# Patient Record
Sex: Female | Born: 1945 | Race: White | Hispanic: No | Marital: Married | Smoking: Never smoker
Health system: Southern US, Community
[De-identification: ages and names within clinical notes are randomized; demographics above are authoritative.]

## PROBLEM LIST (undated history)

## (undated) DIAGNOSIS — T8859XA Other complications of anesthesia, initial encounter: Secondary | ICD-10-CM

## (undated) DIAGNOSIS — Z9289 Personal history of other medical treatment: Secondary | ICD-10-CM

## (undated) DIAGNOSIS — R350 Frequency of micturition: Secondary | ICD-10-CM

## (undated) DIAGNOSIS — M545 Low back pain, unspecified: Secondary | ICD-10-CM

## (undated) DIAGNOSIS — K5792 Diverticulitis of intestine, part unspecified, without perforation or abscess without bleeding: Secondary | ICD-10-CM

## (undated) DIAGNOSIS — M2550 Pain in unspecified joint: Secondary | ICD-10-CM

## (undated) DIAGNOSIS — T8130XA Disruption of wound, unspecified, initial encounter: Secondary | ICD-10-CM

## (undated) DIAGNOSIS — R072 Precordial pain: Secondary | ICD-10-CM

## (undated) DIAGNOSIS — R531 Weakness: Secondary | ICD-10-CM

## (undated) DIAGNOSIS — T4145XA Adverse effect of unspecified anesthetic, initial encounter: Secondary | ICD-10-CM

## (undated) DIAGNOSIS — M1711 Unilateral primary osteoarthritis, right knee: Secondary | ICD-10-CM

## (undated) DIAGNOSIS — Z9889 Other specified postprocedural states: Secondary | ICD-10-CM

## (undated) DIAGNOSIS — G2581 Restless legs syndrome: Secondary | ICD-10-CM

## (undated) DIAGNOSIS — W19XXXA Unspecified fall, initial encounter: Secondary | ICD-10-CM

## (undated) DIAGNOSIS — S7222XA Displaced subtrochanteric fracture of left femur, initial encounter for closed fracture: Secondary | ICD-10-CM

## (undated) DIAGNOSIS — M1712 Unilateral primary osteoarthritis, left knee: Secondary | ICD-10-CM

## (undated) DIAGNOSIS — S72042A Displaced fracture of base of neck of left femur, initial encounter for closed fracture: Secondary | ICD-10-CM

## (undated) DIAGNOSIS — M810 Age-related osteoporosis without current pathological fracture: Secondary | ICD-10-CM

## (undated) DIAGNOSIS — C50919 Malignant neoplasm of unspecified site of unspecified female breast: Secondary | ICD-10-CM

## (undated) DIAGNOSIS — G8929 Other chronic pain: Secondary | ICD-10-CM

## (undated) DIAGNOSIS — E119 Type 2 diabetes mellitus without complications: Secondary | ICD-10-CM

## (undated) DIAGNOSIS — M549 Dorsalgia, unspecified: Secondary | ICD-10-CM

## (undated) DIAGNOSIS — M79604 Pain in right leg: Secondary | ICD-10-CM

## (undated) DIAGNOSIS — M1990 Unspecified osteoarthritis, unspecified site: Secondary | ICD-10-CM

## (undated) DIAGNOSIS — M1710 Unilateral primary osteoarthritis, unspecified knee: Secondary | ICD-10-CM

## (undated) DIAGNOSIS — I10 Essential (primary) hypertension: Secondary | ICD-10-CM

## (undated) DIAGNOSIS — K5900 Constipation, unspecified: Secondary | ICD-10-CM

## (undated) DIAGNOSIS — M109 Gout, unspecified: Secondary | ICD-10-CM

## (undated) DIAGNOSIS — R351 Nocturia: Secondary | ICD-10-CM

## (undated) HISTORY — PX: ESOPHAGOGASTRODUODENOSCOPY: SHX1529

## (undated) HISTORY — PX: EYE SURGERY: SHX253

## (undated) HISTORY — DX: Other specified postprocedural states: Z98.890

## (undated) HISTORY — DX: Displaced subtrochanteric fracture of left femur, initial encounter for closed fracture: S72.22XA

## (undated) HISTORY — PX: VARICOSE VEIN SURGERY: SHX832

## (undated) HISTORY — DX: Age-related osteoporosis without current pathological fracture: M81.0

## (undated) HISTORY — PX: BREAST LUMPECTOMY: SHX2

## (undated) HISTORY — DX: Unilateral primary osteoarthritis, unspecified knee: M17.10

## (undated) HISTORY — PX: BACK SURGERY: SHX140

## (undated) HISTORY — PX: CARDIAC CATHETERIZATION: SHX172

## (undated) HISTORY — DX: Unspecified fall, initial encounter: W19.XXXA

## (undated) HISTORY — PX: BREAST BIOPSY: SHX20

## (undated) HISTORY — DX: Displaced fracture of base of neck of left femur, initial encounter for closed fracture: S72.042A

## (undated) HISTORY — DX: Malignant neoplasm of unspecified site of unspecified female breast: C50.919

## (undated) HISTORY — DX: Precordial pain: R07.2

---

## 1976-04-04 HISTORY — PX: CHOLECYSTECTOMY: SHX55

## 1978-04-04 HISTORY — PX: TUBAL LIGATION: SHX77

## 1979-04-05 HISTORY — PX: OTHER SURGICAL HISTORY: SHX169

## 1996-04-04 HISTORY — PX: PARTIAL HYSTERECTOMY: SHX80

## 2009-08-09 ENCOUNTER — Emergency Department (HOSPITAL_COMMUNITY): Admission: EM | Admit: 2009-08-09 | Discharge: 2009-08-10 | Payer: Self-pay | Admitting: Emergency Medicine

## 2009-10-04 ENCOUNTER — Observation Stay (HOSPITAL_COMMUNITY): Admission: EM | Admit: 2009-10-04 | Discharge: 2009-10-05 | Payer: Self-pay | Admitting: Emergency Medicine

## 2009-10-04 IMAGING — CR DG CHEST 1V PORT
1 series · 1 of 1 positions shown · non-contrast
Comparison: None.

CLINICAL DATA: Pain at anterior neck.  Chest pain.  History
hypertension and diabetes.  Nonsmoker.

PORTABLE CHEST - 1 VIEW

[AP]
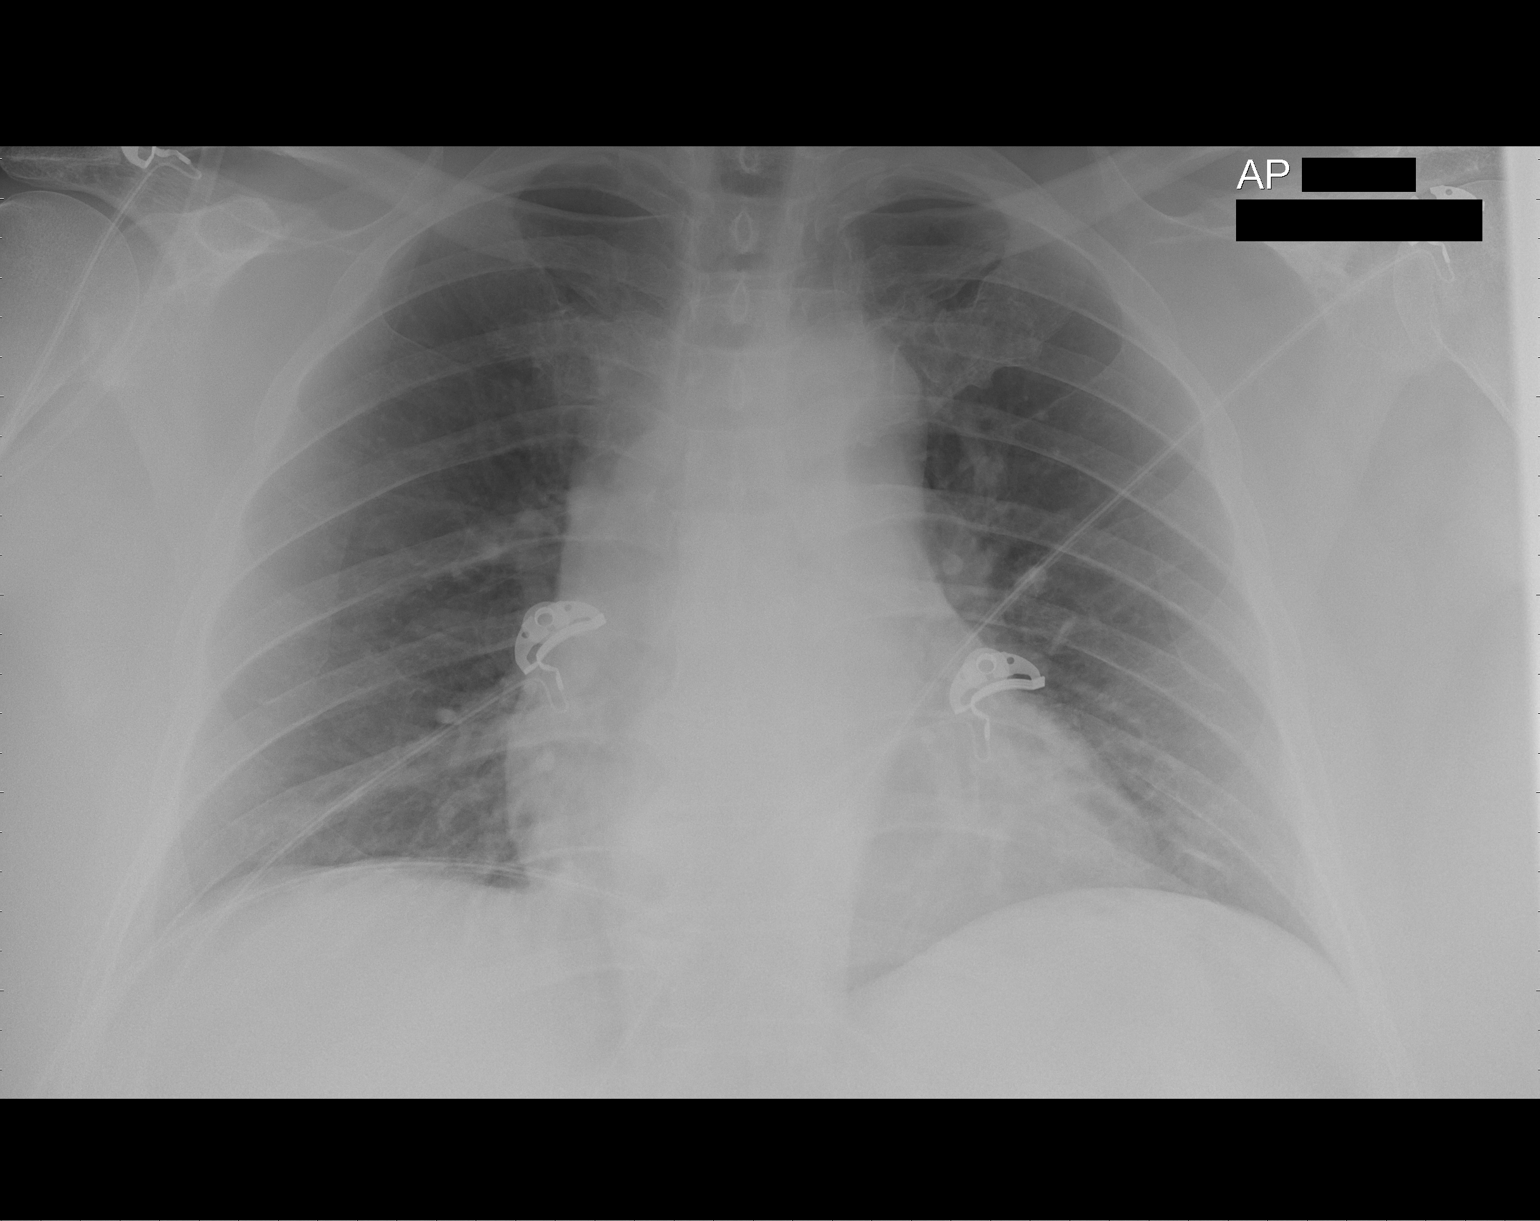

[1 of 1 positions shown; findings below may reference images not displayed]

FINDINGS: Midline trachea.  Heart size upper limits of normal
accentuated by AP portable technique. No pleural effusion or
pneumothorax.  Lung volumes are mildly low.  Minimal subsegmental
atelectasis at the lung bases.
IMPRESSION: Low lung volumes without acute disease.

## 2010-06-20 LAB — CK TOTAL AND CKMB (NOT AT ARMC): Total CK: 111 U/L (ref 7–177)

## 2010-06-20 LAB — BASIC METABOLIC PANEL
BUN: 16 mg/dL (ref 6–23)
CO2: 27 mEq/L (ref 19–32)
Calcium: 8.7 mg/dL (ref 8.4–10.5)
Chloride: 99 mEq/L (ref 96–112)
Creatinine, Ser: 0.98 mg/dL (ref 0.4–1.2)
GFR calc Af Amer: >60 mL/min (ref >60)
Potassium: 3.6 mEq/L (ref 3.5–5.1)

## 2010-06-20 LAB — GLUCOSE, CAPILLARY
Glucose-Capillary: 115 mg/dL — ABNORMAL HIGH (ref 70–99)
Glucose-Capillary: 125 mg/dL — ABNORMAL HIGH (ref 70–99)
Glucose-Capillary: 134 mg/dL — ABNORMAL HIGH (ref 70–99)
Glucose-Capillary: 153 mg/dL — ABNORMAL HIGH (ref 70–99)

## 2010-06-20 LAB — CARDIAC PANEL(CRET KIN+CKTOT+MB+TROPI)
CK, MB: 1.8 ng/mL (ref 0.3–4.0)
Relative Index: INVALID (ref 0.0–2.5)
Total CK: 114 U/L (ref 7–177)
Total CK: 92 U/L (ref 7–177)
Troponin I: 0.02 ng/mL (ref 0.00–0.06)

## 2010-06-20 LAB — URINALYSIS, ROUTINE W REFLEX MICROSCOPIC
Nitrite: NEGATIVE
Specific Gravity, Urine: 1.025 (ref 1.005–1.030)
Urobilinogen, UA: 1 mg/dL (ref 0.0–1.0)
pH: 6 (ref 5.0–8.0)

## 2010-06-20 LAB — LIPID PANEL
Cholesterol: 122 mg/dL (ref 0–200)
HDL: 47 mg/dL (ref >39)
LDL Cholesterol: 64 mg/dL (ref 0–99)
Total CHOL/HDL Ratio: 2.6 RATIO
VLDL: 11 mg/dL (ref 0–40)

## 2010-06-20 LAB — CBC
Hemoglobin: 12.7 g/dL (ref 12.0–15.0)
MCHC: 33.8 g/dL (ref 30.0–36.0)
MCV: 97.6 fL (ref 78.0–100.0)
Platelets: 229 10*3/uL (ref 150–400)
RBC: 3.83 MIL/uL — ABNORMAL LOW (ref 3.87–5.11)
RDW: 13.9 % (ref 11.5–15.5)
WBC: 9.2 10*3/uL (ref 4.0–10.5)

## 2010-06-20 LAB — POCT CARDIAC MARKERS
CKMB, poc: <1 ng/mL — ABNORMAL LOW (ref 1.0–8.0)
Myoglobin, poc: 54.1 ng/mL (ref 12–200)
Troponin i, poc: <0.05 ng/mL (ref 0.00–0.09)

## 2010-06-22 LAB — URINALYSIS, ROUTINE W REFLEX MICROSCOPIC
Hgb urine dipstick: NEGATIVE
Nitrite: NEGATIVE
Protein, ur: NEGATIVE mg/dL
Urobilinogen, UA: 1 mg/dL (ref 0.0–1.0)
pH: 6 (ref 5.0–8.0)

## 2010-06-22 LAB — DIFFERENTIAL
Basophils Relative: 0 % (ref 0–1)
Eosinophils Absolute: 0.1 10*3/uL (ref 0.0–0.7)
Eosinophils Relative: 1 % (ref 0–5)
Lymphocytes Relative: 27 % (ref 12–46)
Monocytes Absolute: 0.9 10*3/uL (ref 0.1–1.0)
Neutro Abs: 7.9 10*3/uL — ABNORMAL HIGH (ref 1.7–7.7)
Neutrophils Relative %: 65 % (ref 43–77)

## 2010-06-22 LAB — CBC
HCT: 39 % (ref 36.0–46.0)
WBC: 12.1 10*3/uL — ABNORMAL HIGH (ref 4.0–10.5)

## 2010-06-22 LAB — COMPREHENSIVE METABOLIC PANEL
ALT: 17 U/L (ref 0–35)
AST: 20 U/L (ref 0–37)
Alkaline Phosphatase: 69 U/L (ref 39–117)
Calcium: 8.3 mg/dL — ABNORMAL LOW (ref 8.4–10.5)
Creatinine, Ser: 0.91 mg/dL (ref 0.4–1.2)
GFR calc Af Amer: >60 mL/min (ref >60)
Glucose, Bld: 168 mg/dL — ABNORMAL HIGH (ref 70–99)
Sodium: 131 mEq/L — ABNORMAL LOW (ref 135–145)

## 2010-06-22 LAB — LIPASE, BLOOD: Lipase: 19 U/L (ref 11–59)

## 2013-09-12 ENCOUNTER — Encounter (HOSPITAL_COMMUNITY): Payer: Self-pay | Admitting: Emergency Medicine

## 2013-09-12 ENCOUNTER — Emergency Department (HOSPITAL_COMMUNITY)
Admission: EM | Admit: 2013-09-12 | Discharge: 2013-09-12 | Disposition: A | Discharge status: Home / Self Care | Payer: Medicare Other | Attending: Emergency Medicine | Admitting: Emergency Medicine

## 2013-09-12 DIAGNOSIS — I10 Essential (primary) hypertension: Secondary | ICD-10-CM | POA: Insufficient documentation

## 2013-09-12 DIAGNOSIS — Z79899 Other long term (current) drug therapy: Secondary | ICD-10-CM | POA: Insufficient documentation

## 2013-09-12 DIAGNOSIS — Z7982 Long term (current) use of aspirin: Secondary | ICD-10-CM | POA: Insufficient documentation

## 2013-09-12 DIAGNOSIS — Z880 Allergy status to penicillin: Secondary | ICD-10-CM | POA: Insufficient documentation

## 2013-09-12 DIAGNOSIS — E119 Type 2 diabetes mellitus without complications: Secondary | ICD-10-CM | POA: Insufficient documentation

## 2013-09-12 DIAGNOSIS — Z791 Long term (current) use of non-steroidal anti-inflammatories (NSAID): Secondary | ICD-10-CM | POA: Insufficient documentation

## 2013-09-12 DIAGNOSIS — E860 Dehydration: Secondary | ICD-10-CM | POA: Insufficient documentation

## 2013-09-12 DIAGNOSIS — M129 Arthropathy, unspecified: Secondary | ICD-10-CM | POA: Insufficient documentation

## 2013-09-12 DIAGNOSIS — R197 Diarrhea, unspecified: Secondary | ICD-10-CM | POA: Insufficient documentation

## 2013-09-12 DIAGNOSIS — Z8719 Personal history of other diseases of the digestive system: Secondary | ICD-10-CM | POA: Insufficient documentation

## 2013-09-12 DIAGNOSIS — Z8669 Personal history of other diseases of the nervous system and sense organs: Secondary | ICD-10-CM | POA: Insufficient documentation

## 2013-09-12 DIAGNOSIS — R112 Nausea with vomiting, unspecified: Secondary | ICD-10-CM

## 2013-09-12 HISTORY — DX: Diverticulitis of intestine, part unspecified, without perforation or abscess without bleeding: K57.92

## 2013-09-12 HISTORY — DX: Essential (primary) hypertension: I10

## 2013-09-12 HISTORY — DX: Unspecified osteoarthritis, unspecified site: M19.90

## 2013-09-12 HISTORY — DX: Type 2 diabetes mellitus without complications: E11.9

## 2013-09-12 HISTORY — DX: Restless legs syndrome: G25.81

## 2013-09-12 LAB — CBC WITH DIFFERENTIAL/PLATELET
Basophils Absolute: 0 10*3/uL (ref 0.0–0.1)
Basophils Relative: 0 % (ref 0–1)
EOS PCT: 1 % (ref 0–5)
Eosinophils Absolute: 0.1 10*3/uL (ref 0.0–0.7)
HEMATOCRIT: 38.6 % (ref 36.0–46.0)
HEMOGLOBIN: 12.9 g/dL (ref 12.0–15.0)
LYMPHS PCT: 28 % (ref 12–46)
Lymphs Abs: 3.2 10*3/uL (ref 0.7–4.0)
MCH: 31.9 pg (ref 26.0–34.0)
MCHC: 33.4 g/dL (ref 30.0–36.0)
MCV: 95.5 fL (ref 78.0–100.0)
MONO ABS: 0.7 10*3/uL (ref 0.1–1.0)
MONOS PCT: 6 % (ref 3–12)
Neutro Abs: 7.4 10*3/uL (ref 1.7–7.7)
Neutrophils Relative %: 65 % (ref 43–77)
Platelets: 284 10*3/uL (ref 150–400)
RBC: 4.04 MIL/uL (ref 3.87–5.11)
RDW: 14.4 % (ref 11.5–15.5)
WBC: 11.4 10*3/uL — AB (ref 4.0–10.5)

## 2013-09-12 LAB — COMPREHENSIVE METABOLIC PANEL
ALT: 25 U/L (ref 0–35)
AST: 20 U/L (ref 0–37)
Albumin: 3.3 g/dL — ABNORMAL LOW (ref 3.5–5.2)
Alkaline Phosphatase: 73 U/L (ref 39–117)
BILIRUBIN TOTAL: 0.5 mg/dL (ref 0.3–1.2)
BUN: 20 mg/dL (ref 6–23)
CALCIUM: 9.7 mg/dL (ref 8.4–10.5)
CO2: 24 mEq/L (ref 19–32)
Chloride: 95 mEq/L — ABNORMAL LOW (ref 96–112)
Creatinine, Ser: 0.78 mg/dL (ref 0.50–1.10)
GFR calc Af Amer: >90 mL/min (ref >90)
GFR, EST NON AFRICAN AMERICAN: 85 mL/min — AB (ref >90)
Glucose, Bld: 157 mg/dL — ABNORMAL HIGH (ref 70–99)
Potassium: 4.6 mEq/L (ref 3.7–5.3)
Sodium: 135 mEq/L — ABNORMAL LOW (ref 137–147)
Total Protein: 7.8 g/dL (ref 6.0–8.3)

## 2013-09-12 LAB — I-STAT TROPONIN, ED: Troponin i, poc: 0 ng/mL (ref 0.00–0.08)

## 2013-09-12 LAB — LIPASE, BLOOD: Lipase: 19 U/L (ref 11–59)

## 2013-09-12 MED ORDER — SODIUM CHLORIDE 0.9 % IV SOLN
1000.0000 mL | Freq: Once | INTRAVENOUS | Status: AC
  Administered 2013-09-12: 1000 mL via INTRAVENOUS

## 2013-09-12 MED ORDER — SODIUM CHLORIDE 0.9 % IV SOLN: 1000.0000 mL | Freq: Once | INTRAVENOUS | Status: DC

## 2013-09-12 MED ORDER — SODIUM CHLORIDE 0.9 % IV SOLN: 1000.0000 mL | INTRAVENOUS | Status: DC

## 2013-09-12 MED ORDER — ONDANSETRON 8 MG PO TBDP: 8.0000 mg | ORAL_TABLET | Freq: Three times a day (TID) | ORAL | Status: DC | PRN

## 2013-09-12 MED ORDER — ONDANSETRON HCL 4 MG/2ML IJ SOLN
4.0000 mg | Freq: Once | INTRAMUSCULAR | Status: AC
  Administered 2013-09-12: 4 mg via INTRAVENOUS
  Filled 2013-09-12: qty 2

## 2013-09-12 NOTE — ED Provider Notes (Signed)
CSN: [id number]     Arrival date & time 09/12/13  1203 History   First MD Initiated Contact with Patient 09/12/13 1311     Chief Complaint  Patient presents with  . Nausea    The patient has been sick to her stomach since saturday.  The patient said, 'I can't keep anything down."  . Emesis  . Diarrhea      The history is provided by the patient.   Patient reports nausea vomiting and diarrhea over the past several days.  She feels weak today.  She's been unable to keep fluids down.  She reports she is vomiting yellowish material.  No hematemesis.  No melena or hematochezia.  She does report some upper abdominal discomfort.  She is status post cholecystectomy in 1978.  No fevers or chills.  No chest pain or shortness of breath.  No recent sick contact   Past Medical History  Diagnosis Date  . Hypertension   . Diabetes mellitus without complication   . Diverticulitis   . Restless leg syndrome   . Arthritis    Past Surgical History  Procedure Laterality Date  . Tubal ligation    . Abdominal hysterectomy      Partial  . Bladder tacking    . Cholecystectomy     History reviewed. No pertinent family history. History  Substance Use Topics  . Smoking status: Never Smoker   . Smokeless tobacco: Never Used  . Alcohol Use: No   OB History   Grav Para Term Preterm Abortions TAB SAB Ect Mult Living                 Review of Systems  All other systems reviewed and are negative.     Allergies  Penicillins  Home Medications   Prior to Admission medications   Medication Sig Start Date End Date Taking? Authorizing Provider  acetaminophen (TYLENOL) 325 MG tablet Take 650 mg by mouth every 6 (six) hours as needed for mild pain, fever or headache.   Yes Historical Provider, MD  allopurinol (ZYLOPRIM) 100 MG tablet Take 100 mg by mouth 2 (two) times daily.   Yes Historical Provider, MD  aspirin EC 81 MG tablet Take 81 mg by mouth daily.   Yes Historical Provider, MD   Cholecalciferol (VITAMIN D) 2000 UNITS CAPS Take 2,000 Units by mouth daily.   Yes Historical Provider, MD  glimepiride (AMARYL) 1 MG tablet Take 1 mg by mouth daily with breakfast.   Yes Historical Provider, MD  ibuprofen (ADVIL,MOTRIN) 200 MG tablet Take 800 mg by mouth every 6 (six) hours as needed for fever, headache or mild pain.   Yes Historical Provider, MD  lisinopril-hydrochlorothiazide (PRINZIDE,ZESTORETIC) 20-25 MG per tablet Take 1 tablet by mouth daily.   Yes Historical Provider, MD  meloxicam (MOBIC) 15 MG tablet Take 15 mg by mouth daily.   Yes Historical Provider, MD  metFORMIN (GLUCOPHAGE) 500 MG tablet Take 500 mg by mouth 2 (two) times daily with a meal.   Yes Historical Provider, MD  pregabalin (LYRICA) 150 MG capsule Take 150 mg by mouth 3 (three) times daily.   Yes Historical Provider, MD  rOPINIRole (REQUIP) 2 MG tablet Take 2 mg by mouth at bedtime.   Yes Historical Provider, MD  terbinafine (LAMISIL) 250 MG tablet Take 250 mg by mouth daily.   Yes Historical Provider, MD   BP 126/75  Pulse 100  Temp(Src) 97.7 F (36.5 C) (Oral)  Resp 18  SpO2 100% Physical  Exam  Nursing note and vitals reviewed. Constitutional: She is oriented to person, place, and time. She appears well-developed and well-nourished. No distress.  HENT:  Head: Normocephalic and atraumatic.  Dry mucous membranes  Eyes: EOM are normal.  Neck: Normal range of motion.  Cardiovascular: Normal rate, regular rhythm and normal heart sounds.   Pulmonary/Chest: Effort normal and breath sounds normal.  Abdominal: Soft. She exhibits no distension. There is no tenderness.  Musculoskeletal: Normal range of motion.  Neurological: She is alert and oriented to person, place, and time.  Skin: Skin is warm and dry.  Psychiatric: She has a normal mood and affect. Judgment normal.    ED Course  Procedures (including critical care time) Labs Review Labs Reviewed  CBC WITH DIFFERENTIAL - Abnormal; Notable for  the following:    WBC 11.4 (*)    All other components within normal limits  COMPREHENSIVE METABOLIC PANEL - Abnormal; Notable for the following:    Sodium 135 (*)    Chloride 95 (*)    Glucose, Bld 157 (*)    Albumin 3.3 (*)    GFR calc non Af Amer 85 (*)    All other components within normal limits  LIPASE, BLOOD  I-STAT TROPOININ, ED    Imaging Review No results found.   EKG Interpretation   Date/Time:  Thursday September 12 2013 12:31:49 EDT Ventricular Rate:  98 PR Interval:    QRS Duration: 86 QT Interval:  320 QTC Calculation: 408 R Axis:   -80 Text Interpretation:  Normal sinus rhythm Left axis deviation Pulmonary  disease pattern Abnormal ECG Confirmed by Rashauna Tep  MD, Caryn Bee ([id number]) on  09/12/2013 1:31:10 PM      MDM   Final diagnoses:  None    3:25 PM Patient feels much better after IV fluids and Zofran.  Patient is keeping oral fluids and food down at this time.  She feels better would like to go home.  Discharge home with Zofran.  She understands return to the ER for new or worsening symptoms.  I suspect the majority of her upper abdominal discomfort is secondary to vomiting and muscular pain    Lyanne Co, MD 09/12/13 1525

## 2013-09-12 NOTE — ED Notes (Addendum)
The patient has been sick to her stomach since saturday.  The patient said, 'I can't keep anything down."  The patient said she took her Lyrica and Ropinirole for restless leg syndrome and she doesn't know if taking them together is what's making her sick.  The patient said she took it again together on Monday and she "passed out".  She also said she cannot eat anything because her stomach is sick.   The patient has been feeling weak and nauseous since Saturday.  She is here today to be evaluated.

## 2014-04-09 ENCOUNTER — Encounter (HOSPITAL_COMMUNITY): Payer: Self-pay | Admitting: Physician Assistant

## 2014-04-09 DIAGNOSIS — M1711 Unilateral primary osteoarthritis, right knee: Secondary | ICD-10-CM | POA: Diagnosis present

## 2014-04-09 DIAGNOSIS — M1990 Unspecified osteoarthritis, unspecified site: Secondary | ICD-10-CM | POA: Diagnosis present

## 2014-04-09 DIAGNOSIS — K5792 Diverticulitis of intestine, part unspecified, without perforation or abscess without bleeding: Secondary | ICD-10-CM | POA: Diagnosis present

## 2014-04-09 DIAGNOSIS — E119 Type 2 diabetes mellitus without complications: Secondary | ICD-10-CM

## 2014-04-09 DIAGNOSIS — I10 Essential (primary) hypertension: Secondary | ICD-10-CM | POA: Diagnosis present

## 2014-04-09 NOTE — H&P (Signed)
TOTAL KNEE ADMISSION H&P  Patient is being admitted for right total knee arthroplasty.  Subjective:  Chief Complaint:right knee pain.  HPI: Amber Ashley, 69 y.o. female, has a history of pain and functional disability in the right knee due to arthritis and has failed non-surgical conservative treatments for greater than 12 weeks to includeNSAID's and/or analgesics, corticosteriod injections, viscosupplementation injections, flexibility and strengthening excercises, supervised PT with diminished ADL's post treatment, use of assistive devices and activity modification.  Onset of symptoms was gradual, starting 10 years ago with gradually worsening course since that time. The patient noted no past surgery on the right knee(s).  Patient currently rates pain in the right knee(s) at 10 out of 10 with activity. Patient has night pain, worsening of pain with activity and weight bearing, pain that interferes with activities of daily living, crepitus and joint swelling.  Patient has evidence of subchondral sclerosis, periarticular osteophytes and joint space narrowing by imaging studies.  There is no active infection.  Patient Active Problem List   Diagnosis Date Noted  . Primary localized osteoarthritis of right knee   . Hypertension   . Diabetes mellitus without complication   . Diverticulitis   . Arthritis    Past Medical History  Diagnosis Date  . Hypertension   . Diabetes mellitus without complication   . Diverticulitis   . Restless leg syndrome   . Arthritis   . Primary localized osteoarthritis of right knee     Past Surgical History  Procedure Laterality Date  . Tubal ligation    . Abdominal hysterectomy      Partial  . Bladder tacking    . Cholecystectomy      No prescriptions prior to admission   Allergies  Allergen Reactions  . Penicillins     History  Substance Use Topics  . Smoking status: Never Smoker   . Smokeless tobacco: Never Used  . Alcohol Use: No    Family  History  Problem Relation Age of Onset  . Heart attack Mother   . Stroke Mother   . Heart disease Father   . Heart attack Father   . Hypertension Brother   . Diabetes Brother      Review of Systems  Constitutional: Negative.   HENT: Negative.   Eyes: Negative.   Respiratory: Negative.   Cardiovascular: Negative.   Gastrointestinal: Negative.   Genitourinary: Negative.   Musculoskeletal: Positive for joint pain.       Neuropathy and knee pain  Skin: Negative.   Neurological: Negative.   Endo/Heme/Allergies: Negative.   Psychiatric/Behavioral: Negative.     Objective:  Physical Exam  Constitutional: She appears well-developed and well-nourished.  HENT:  Head: Normocephalic and atraumatic.  Mouth/Throat: Oropharynx is clear and moist.  Eyes: Conjunctivae and EOM are normal. Pupils are equal, round, and reactive to light.  Neck: Neck supple.  Cardiovascular: Normal rate and regular rhythm.   Respiratory: Effort normal and breath sounds normal.  GI: Soft. Bowel sounds are normal.  Genitourinary:  Not pertinent to current symptomatology therefore not examined.  Musculoskeletal:  Examination of both knee reveal pain medially and laterally, 1+ crepitation 1+ synovitis, range of motion -5 to 125 degrees both knees are stable with normal patella tracking. Vascular exam: pulses 2+ and symmetric.  Skin: Skin is warm and dry.  Psychiatric: She has a normal mood and affect.    Vital signs in last 24 hours: Temp:  [97.9 F (36.6 C)] 97.9 F (36.6 C) (01/06 1300) Pulse Rate:  [  77] 77 (01/06 1300) BP: (119)/(71) 119/71 mmHg (01/06 1300) SpO2:  [99 %] 99 % (01/06 1300) Weight:  [106.595 kg (235 lb)] 106.595 kg (235 lb) (01/06 1300)  Labs:   Estimated body mass index is 42.97 kg/(m^2) as calculated from the following:   Height as of this encounter:  (1.575 m).   Weight as of this encounter: 106.595 kg (235 lb).   Imaging Review Plain radiographs demonstrate severe  degenerative joint disease of the right knee(s). The overall alignment ismild valgus. The bone quality appears to be good for age and reported activity level.  Assessment/Plan:  End stage arthritis, right knee   The patient history, physical examination, clinical judgment of the provider and imaging studies are consistent with end stage degenerative joint disease of the right knee(s) and total knee arthroplasty is deemed medically necessary. The treatment options including medical management, injection therapy arthroscopy and arthroplasty were discussed at length. The risks and benefits of total knee arthroplasty were presented and reviewed. The risks due to aseptic loosening, infection, stiffness, patella tracking problems, thromboembolic complications and other imponderables were discussed. The patient acknowledged the explanation, agreed to proceed with the plan and consent was signed. Patient is being admitted for inpatient treatment for surgery, pain control, PT, OT, prophylactic antibiotics, VTE prophylaxis, progressive ambulation and ADL's and discharge planning. The patient is planning to be discharged home with home health services   Rozanna Cormany A. Gwinda Passe Physician Assistant [facility] [phone]  04/09/2014, 2:45 PM

## 2014-04-10 ENCOUNTER — Other Ambulatory Visit: Payer: Self-pay | Admitting: Orthopedic Surgery

## 2014-04-10 DIAGNOSIS — M51369 Other intervertebral disc degeneration, lumbar region without mention of lumbar back pain or lower extremity pain: Secondary | ICD-10-CM

## 2014-04-10 DIAGNOSIS — M5416 Radiculopathy, lumbar region: Secondary | ICD-10-CM

## 2014-04-10 DIAGNOSIS — M5136 Other intervertebral disc degeneration, lumbar region: Secondary | ICD-10-CM

## 2014-04-11 ENCOUNTER — Ambulatory Visit
Admission: RE | Admit: 2014-04-11 | Discharge: 2014-04-11 | Disposition: A | Discharge status: Home / Self Care | Payer: Medicare Other | Source: Ambulatory Visit | Attending: Orthopedic Surgery | Admitting: Orthopedic Surgery

## 2014-04-11 DIAGNOSIS — M5416 Radiculopathy, lumbar region: Secondary | ICD-10-CM

## 2014-04-11 DIAGNOSIS — M5136 Other intervertebral disc degeneration, lumbar region: Secondary | ICD-10-CM

## 2014-04-14 ENCOUNTER — Other Ambulatory Visit: Payer: Self-pay | Admitting: Orthopedic Surgery

## 2014-04-14 DIAGNOSIS — M79604 Pain in right leg: Secondary | ICD-10-CM

## 2014-04-16 ENCOUNTER — Inpatient Hospital Stay (HOSPITAL_COMMUNITY): Admission: RE | Admit: 2014-04-16 | Payer: Medicare Other | Source: Ambulatory Visit

## 2014-04-17 ENCOUNTER — Ambulatory Visit
Admission: RE | Admit: 2014-04-17 | Discharge: 2014-04-17 | Disposition: A | Discharge status: Home / Self Care | Payer: Medicare Other | Source: Ambulatory Visit | Attending: Orthopedic Surgery | Admitting: Orthopedic Surgery

## 2014-04-17 DIAGNOSIS — M79604 Pain in right leg: Secondary | ICD-10-CM

## 2014-04-17 DIAGNOSIS — M1990 Unspecified osteoarthritis, unspecified site: Secondary | ICD-10-CM

## 2014-04-17 MED ORDER — IOHEXOL 180 MG/ML  SOLN
1.0000 mL | Freq: Once | INTRAMUSCULAR | Status: AC | PRN
  Administered 2014-04-17: 1 mL via EPIDURAL

## 2014-04-17 MED ORDER — METHYLPREDNISOLONE ACETATE 40 MG/ML INJ SUSP (RADIOLOG
120.0000 mg | Freq: Once | INTRAMUSCULAR | Status: AC
  Administered 2014-04-17: 120 mg via EPIDURAL

## 2014-04-17 NOTE — Discharge Instructions (Signed)

## 2014-05-07 ENCOUNTER — Encounter (HOSPITAL_COMMUNITY): Payer: Self-pay | Admitting: Physician Assistant

## 2014-05-07 DIAGNOSIS — M545 Low back pain: Secondary | ICD-10-CM | POA: Diagnosis present

## 2014-05-07 DIAGNOSIS — M1712 Unilateral primary osteoarthritis, left knee: Secondary | ICD-10-CM | POA: Diagnosis present

## 2014-05-07 DIAGNOSIS — M79604 Pain in right leg: Secondary | ICD-10-CM | POA: Diagnosis present

## 2014-05-07 NOTE — H&P (Signed)
TOTAL KNEE ADMISSION H&P  Patient is being admitted for left total knee arthroplasty.  Subjective:  Chief Complaint:left knee pain.  HPI: Amber Ashley, 69 y.o. female, has a history of pain and functional disability in the left knee due to arthritis and has failed non-surgical conservative treatments for greater than 12 weeks to includeNSAID's and/or analgesics, corticosteriod injections, viscosupplementation injections, flexibility and strengthening excercises, supervised PT with diminished ADL's post treatment, use of assistive devices, weight reduction as appropriate and activity modification.  Onset of symptoms was gradual, starting 10 years ago with gradually worsening course since that time. The patient noted no past surgery on the left knee(s).  Patient currently rates pain in the left knee(s) at 9 out of 10 with activity. Patient has night pain, worsening of pain with activity and weight bearing, pain that interferes with activities of daily living, crepitus and joint swelling.  Patient has evidence of subchondral sclerosis, periarticular osteophytes and joint space narrowing by imaging studies.  There is no active infection.  Patient Active Problem List   Diagnosis Date Noted  . Primary localized osteoarthritis of left knee   . Lumbar pain with radiation down right leg   . Hypertension   . Diabetes mellitus without complication   . Diverticulitis   . Arthritis    Past Medical History  Diagnosis Date  . Hypertension   . Diabetes mellitus without complication   . Diverticulitis   . Restless leg syndrome   . Arthritis   . Primary localized osteoarthritis of left knee   . Lumbar pain with radiation down right leg     Past Surgical History  Procedure Laterality Date  . Tubal ligation    . Abdominal hysterectomy      Partial  . Bladder tacking    . Cholecystectomy      No prescriptions prior to admission  No current facility-administered medications for this  encounter.  Current outpatient prescriptions:  .  acetaminophen (TYLENOL) 325 MG tablet, Take 650 mg by mouth every 6 (six) hours as needed for mild pain, fever or headache., Disp: , Rfl:  .  allopurinol (ZYLOPRIM) 100 MG tablet, Take 100 mg by mouth 2 (two) times daily., Disp: , Rfl:  .  aspirin EC 81 MG tablet, Take 81 mg by mouth daily., Disp: , Rfl:  .  Cholecalciferol (VITAMIN D) 2000 UNITS CAPS, Take 2,000 Units by mouth daily., Disp: , Rfl:  .  glimepiride (AMARYL) 1 MG tablet, Take 1 mg by mouth daily with breakfast., Disp: , Rfl:  .  lisinopril-hydrochlorothiazide (PRINZIDE,ZESTORETIC) 20-25 MG per tablet, Take 1 tablet by mouth daily., Disp: , Rfl:  .  metFORMIN (GLUCOPHAGE) 500 MG tablet, Take 500 mg by mouth 2 (two) times daily with a meal., Disp: , Rfl:  .  oxyCODONE-acetaminophen (PERCOCET) 7.5-325 MG per tablet, Take 1 tablet by mouth every 6 (six) hours as needed for pain., Disp: , Rfl:  .  pregabalin (LYRICA) 150 MG capsule, Take 150 mg by mouth 3 (three) times daily., Disp: , Rfl:  .  rOPINIRole (REQUIP) 2 MG tablet, Take 2 mg by mouth at bedtime., Disp: , Rfl:  .  ibuprofen (ADVIL,MOTRIN) 200 MG tablet, Take 800 mg by mouth every 6 (six) hours as needed for fever, headache or mild pain., Disp: , Rfl:  .  terbinafine (LAMISIL) 250 MG tablet, Take 250 mg by mouth daily., Disp: , Rfl:  Allergies  Allergen Reactions  . Penicillins     History  Substance Use Topics  .  Smoking status: Never Smoker   . Smokeless tobacco: Never Used  . Alcohol Use: No    Family History  Problem Relation Age of Onset  . Heart attack Mother   . Stroke Mother   . Heart disease Father   . Heart attack Father   . Hypertension Brother   . Diabetes Brother      Review of Systems  Constitutional: Negative.   HENT: Negative.   Eyes: Negative.   Respiratory: Negative.   Cardiovascular: Positive for chest pain.       Chest tightness and discomfort for 3 weeks.  Appt with Dr Antoine PocheHochrein on May 09, 2014  Gastrointestinal: Negative.   Genitourinary: Negative.   Musculoskeletal:       Left knee pain  Skin: Negative.   Neurological: Negative.   Endo/Heme/Allergies: Negative.   Psychiatric/Behavioral: Negative.     Objective:  Physical Exam  Constitutional: She is oriented to person, place, and time. She appears well-developed and well-nourished.  HENT:  Head: Normocephalic and atraumatic.  Mouth/Throat: Oropharynx is clear and moist.  Eyes: Conjunctivae and EOM are normal. Pupils are equal, round, and reactive to light.  Neck: Neck supple.  Cardiovascular: Normal rate, regular rhythm and normal heart sounds.   Respiratory: Effort normal and breath sounds normal.  GI: Soft. Bowel sounds are normal.  Genitourinary:  Not pertinent to current symptomatology therefore not examined.  Musculoskeletal:  She is independently ambulatory with a slightly antalgic gait.  She has active range of motion of her left knee -5 to 110 degrees.  2+ crepitus.  2+ synovitis.  Medial joint line tenderness.  Distal neurovascular exam is intact.  Right knee has range of motion -5 to 100  2+ crepitus.  2+ synovitis.  Minimal pain today. Distal neurovascular exam is intact.   Neurological: She is alert and oriented to person, place, and time.  Skin: Skin is warm and dry.  Psychiatric: She has a normal mood and affect. Her behavior is normal.    Vital signs in last 24 hours: Temp:  [97.8 F (36.6 C)] 97.8 F (36.6 C) (02/03 1300) Pulse Rate:  [83] 83 (02/03 1300) BP: (110)/(67) 110/67 mmHg (02/03 1300) SpO2:  [98 %] 98 % (02/03 1300) Weight:  [107.502 kg (237 lb)] 107.502 kg (237 lb) (02/03 1300)  Labs:   Estimated body mass index is 41.99 kg/(m^2) as calculated from the following:   Height as of this encounter: 5\' 3"  (1.6 m).   Weight as of this encounter: 107.502 kg (237 lb).   Imaging Review Plain radiographs demonstrate severe degenerative joint disease of the left knee(s). The overall  alignment issignificant varus. The bone quality appears to be good for age and reported activity level.  Assessment/Plan:  End stage arthritis, left knee  Principal Problem:   Primary localized osteoarthritis of left knee Active Problems:   Hypertension   Diabetes mellitus without complication   Diverticulitis   Arthritis   Lumbar pain with radiation down right leg   The patient history, physical examination, clinical judgment of the provider and imaging studies are consistent with end stage degenerative joint disease of the left knee(s) and total knee arthroplasty is deemed medically necessary. The treatment options including medical management, injection therapy arthroscopy and arthroplasty were discussed at length. The risks and benefits of total knee arthroplasty were presented and reviewed. The risks due to aseptic loosening, infection, stiffness, patella tracking problems, thromboembolic complications and other imponderables were discussed. The patient acknowledged the explanation, agreed to proceed  with the plan and consent was signed. Patient is being admitted for inpatient treatment for surgery, pain control, PT, OT, prophylactic antibiotics, VTE prophylaxis, progressive ambulation and ADL's and discharge planning. The patient is planning to be discharged home with home health services  UTI preop.  Started Cipro on 05/09/2014.  Will need a UA   C&S when foley is inserted and antibiotics in the cement.

## 2014-05-09 ENCOUNTER — Encounter: Payer: Self-pay | Admitting: Cardiology

## 2014-05-09 ENCOUNTER — Encounter (HOSPITAL_COMMUNITY)
Admission: RE | Admit: 2014-05-09 | Discharge: 2014-05-09 | Disposition: A | Discharge status: Home / Self Care | Payer: Medicare Other | Source: Ambulatory Visit | Attending: Orthopedic Surgery | Admitting: Orthopedic Surgery

## 2014-05-09 ENCOUNTER — Encounter (HOSPITAL_COMMUNITY): Payer: Self-pay

## 2014-05-09 ENCOUNTER — Ambulatory Visit (INDEPENDENT_AMBULATORY_CARE_PROVIDER_SITE_OTHER): Payer: Medicare Other | Admitting: Cardiology

## 2014-05-09 VITALS — BP 122/78 | HR 65 | Ht 63.0 in | Wt 238.0 lb

## 2014-05-09 DIAGNOSIS — I10 Essential (primary) hypertension: Secondary | ICD-10-CM | POA: Diagnosis not present

## 2014-05-09 DIAGNOSIS — N390 Urinary tract infection, site not specified: Secondary | ICD-10-CM | POA: Insufficient documentation

## 2014-05-09 DIAGNOSIS — R072 Precordial pain: Secondary | ICD-10-CM | POA: Insufficient documentation

## 2014-05-09 DIAGNOSIS — M1711 Unilateral primary osteoarthritis, right knee: Secondary | ICD-10-CM | POA: Insufficient documentation

## 2014-05-09 DIAGNOSIS — Z0182 Encounter for allergy testing: Secondary | ICD-10-CM | POA: Diagnosis not present

## 2014-05-09 DIAGNOSIS — Z0183 Encounter for blood typing: Secondary | ICD-10-CM | POA: Insufficient documentation

## 2014-05-09 DIAGNOSIS — E119 Type 2 diabetes mellitus without complications: Secondary | ICD-10-CM | POA: Diagnosis not present

## 2014-05-09 DIAGNOSIS — Z01812 Encounter for preprocedural laboratory examination: Secondary | ICD-10-CM | POA: Diagnosis present

## 2014-05-09 DIAGNOSIS — R0789 Other chest pain: Secondary | ICD-10-CM | POA: Diagnosis not present

## 2014-05-09 HISTORY — DX: Adverse effect of unspecified anesthetic, initial encounter: T41.45XA

## 2014-05-09 HISTORY — DX: Other complications of anesthesia, initial encounter: T88.59XA

## 2014-05-09 HISTORY — DX: Precordial pain: R07.2

## 2014-05-09 LAB — URINALYSIS, ROUTINE W REFLEX MICROSCOPIC
BILIRUBIN URINE: NEGATIVE
Glucose, UA: NEGATIVE mg/dL
Hgb urine dipstick: NEGATIVE
Ketones, ur: NEGATIVE mg/dL
NITRITE: NEGATIVE
Protein, ur: NEGATIVE mg/dL
Specific Gravity, Urine: 1.015 (ref 1.005–1.030)
UROBILINOGEN UA: 0.2 mg/dL (ref 0.0–1.0)
pH: 5 (ref 5.0–8.0)

## 2014-05-09 LAB — COMPREHENSIVE METABOLIC PANEL
ALT: 23 U/L (ref 0–35)
ANION GAP: 7 (ref 5–15)
AST: 26 U/L (ref 0–37)
Albumin: 4.1 g/dL (ref 3.5–5.2)
Alkaline Phosphatase: 53 U/L (ref 39–117)
BUN: 34 mg/dL — AB (ref 6–23)
CO2: 25 mmol/L (ref 19–32)
Calcium: 9.3 mg/dL (ref 8.4–10.5)
Chloride: 101 mmol/L (ref 96–112)
Creatinine, Ser: 0.98 mg/dL (ref 0.50–1.10)
GFR calc Af Amer: 68 mL/min — ABNORMAL LOW (ref >90)
GFR calc non Af Amer: 59 mL/min — ABNORMAL LOW (ref >90)
Glucose, Bld: 114 mg/dL — ABNORMAL HIGH (ref 70–99)
Potassium: 4.2 mmol/L (ref 3.5–5.1)
Sodium: 133 mmol/L — ABNORMAL LOW (ref 135–145)
Total Bilirubin: 0.8 mg/dL (ref 0.3–1.2)
Total Protein: 7 g/dL (ref 6.0–8.3)

## 2014-05-09 LAB — PROTIME-INR
INR: 1.11 (ref 0.00–1.49)
PROTHROMBIN TIME: 14.4 s (ref 11.6–15.2)

## 2014-05-09 LAB — CBC WITH DIFFERENTIAL/PLATELET
BASOS ABS: 0 10*3/uL (ref 0.0–0.1)
Basophils Relative: 0 % (ref 0–1)
Eosinophils Absolute: 0.2 10*3/uL (ref 0.0–0.7)
Eosinophils Relative: 2 % (ref 0–5)
HCT: 37.9 % (ref 36.0–46.0)
HEMOGLOBIN: 12.6 g/dL (ref 12.0–15.0)
Lymphocytes Relative: 40 % (ref 12–46)
Lymphs Abs: 2.9 10*3/uL (ref 0.7–4.0)
MCH: 32.1 pg (ref 26.0–34.0)
MCHC: 33.2 g/dL (ref 30.0–36.0)
MCV: 96.7 fL (ref 78.0–100.0)
MONOS PCT: 5 % (ref 3–12)
Monocytes Absolute: 0.3 10*3/uL (ref 0.1–1.0)
Neutro Abs: 3.7 10*3/uL (ref 1.7–7.7)
Neutrophils Relative %: 53 % (ref 43–77)
Platelets: 244 10*3/uL (ref 150–400)
RBC: 3.92 MIL/uL (ref 3.87–5.11)
RDW: 14.6 % (ref 11.5–15.5)
WBC: 7.1 10*3/uL (ref 4.0–10.5)

## 2014-05-09 LAB — SURGICAL PCR SCREEN
MRSA, PCR: NEGATIVE
STAPHYLOCOCCUS AUREUS: NEGATIVE

## 2014-05-09 LAB — ABO/RH: ABO/RH(D): B POS

## 2014-05-09 LAB — URINE MICROSCOPIC-ADD ON

## 2014-05-09 LAB — TYPE AND SCREEN
ABO/RH(D): B POS
Antibody Screen: NEGATIVE

## 2014-05-09 LAB — GLUCOSE, CAPILLARY: Glucose-Capillary: 68 mg/dL — ABNORMAL LOW (ref 70–99)

## 2014-05-09 LAB — APTT: aPTT: 28 seconds (ref 24–37)

## 2014-05-09 NOTE — Progress Notes (Signed)
Cardiology Office Note   Date:  05/09/2014   ID:  Amber Ashley, DOB 09/10/1945, MRN [id number]  PCP:  Treasa SchoolLAND, PHILLIP, PA-C  Cardiologist:   Rollene RotundaJames Cleve Paolillo, MD   No chief complaint on file.     History of Present Illness: Amber Ashley is a 69 y.o. female who presents for evaluation of chest discomfort. She has had no past cardiac history other than some chest discomfort for which she had an echocardiogram several years ago. I was able to review this result. She had a well preserved ejection fraction and no other significant findings. She is due to have knee surgery. She was telling him in the orthopedics office that she had some chest discomfort. This happens sporadically but it is only in the morning. She wakes up with it. It does not wake her from her sleep. It is 3 or 4 times per week. It is mild in intensity. She props herself up and it goes away. She has never had this before. She does not get it during the day when she is active. She can walk to the grocery store and it sounds like she can achieve greater than 5 METS of activity as needed without bringing on any of the symptoms. She denies any resting shortness of breath, PND or orthopnea. She has no palpitations, presyncope or syncope.    Past Medical History  Diagnosis Date  . Hypertension   . Diabetes mellitus without complication   . Diverticulitis   . Restless leg syndrome   . Arthritis   . Primary localized osteoarthritis of left knee   . Lumbar pain with radiation down right leg     Past Surgical History  Procedure Laterality Date  . Tubal ligation    . Abdominal hysterectomy      Partial  . Bladder tacking    . Cholecystectomy       Current Outpatient Prescriptions  Medication Sig Dispense Refill  . acetaminophen (TYLENOL) 325 MG tablet Take 650 mg by mouth every 6 (six) hours as needed for mild pain, fever or headache.    . allopurinol (ZYLOPRIM) 100 MG tablet Take 100 mg by mouth 2 (two) times daily.      Marland Kitchen. aspirin EC 81 MG tablet Take 81 mg by mouth daily.    . Cholecalciferol (VITAMIN D) 2000 UNITS CAPS Take 2,000 Units by mouth daily.    Marland Kitchen. glimepiride (AMARYL) 1 MG tablet Take 1 mg by mouth daily with breakfast.    . lisinopril-hydrochlorothiazide (PRINZIDE,ZESTORETIC) 20-25 MG per tablet Take 1 tablet by mouth daily.    . metFORMIN (GLUCOPHAGE) 500 MG tablet Take 500 mg by mouth 2 (two) times daily with a meal.    . nabumetone (RELAFEN) 750 MG tablet Take 750 mg by mouth 2 (two) times daily.    Marland Kitchen. oxyCODONE-acetaminophen (PERCOCET) 7.5-325 MG per tablet Take 1 tablet by mouth every 6 (six) hours as needed for pain.    . pregabalin (LYRICA) 150 MG capsule Take 150 mg by mouth 3 (three) times daily.    Marland Kitchen. rOPINIRole (REQUIP) 2 MG tablet Take 2 mg by mouth at bedtime.     No current facility-administered medications for this visit.    Allergies:   Penicillins    Social History:  The patient  reports that she has never smoked. She has never used smokeless tobacco. She reports that she does not drink alcohol or use illicit drugs.   Family History:  The patient's family history includes Diabetes  in her brother; Heart attack in her father and mother; Heart disease in her father; Hypertension in her brother; Stroke in her mother.    ROS:  Please see the history of present illness.   Otherwise, review of systems are positive for none.   All other systems are reviewed and negative.    PHYSICAL EXAM: VS:  BP 122/78 mmHg  Pulse 65  Ht  (1.6 m)  Wt 238 lb (107.956 kg)  BMI 42.17 kg/m2 , BMI Body mass index is 42.17 kg/(m^2). GEN: Well nourished, well developed, in no acute distress HEENT: normal Neck: no JVD, carotid bruits, or masses Cardiac: RRR; no murmurs, rubs, or gallops,no edema  Respiratory:  clear to auscultation bilaterally, normal work of breathing GI: soft, nontender, nondistended, + BS MS: no deformity or atrophy Skin: warm and dry, no rash Neuro:  Strength and sensation  are intact Psych: euthymic mood, full affect   EKG:  EKG is ordered today. The ekg ordered today demonstrates sinus rhythm, rate 65, low voltage on the limb leads, no acute ST-T wave changes.  Recent Labs: 09/12/2013: ALT 25; BUN 20; Creatinine 0.78; Hemoglobin 12.9; Platelets 284; Potassium 4.6; Sodium 135*    Lipid Panel     Wt Readings from Last 3 Encounters:  05/09/14 238 lb (107.956 kg)  05/07/14 237 lb (107.502 kg)      Other studies Reviewed: Additional studies/ records that were reviewed today include: Echocardiogram from 2011. Review of the above records demonstrates: See elsewhere   ASSESSMENT AND PLAN:  CHEST PAIN:  Her chest pain is very atypical. The pretest probability of obstructive coronary disease is very low. At this point no further cardiovascular testing is suggested. However, be happy to see her back if her symptoms worsen. We discussed the possibility of musculoskeletal or GI symptoms and in particular we discussed the possibility of reflux. I would suggest she discuss this further with LAND, PHILLIP, PA-C   PREOP:  The patient has no high-risk findings. She has reasonable functional level despite her knee pain. She's going for a procedure that is not high risk from a cardiovascular standpoint. Therefore, based on ACC/AHA guidelines, the patient would be at acceptable risk for the planned procedure without further cardiovascular testing.  Current medicines are reviewed at length with the patient today.  The patient has concerns regarding medicines.  The following changes have been made:  no change  Labs/ tests ordered today include: None  No orders of the defined types were placed in this encounter.     Disposition:   FU with me as needed.   Signed, Rollene Rotunda, MD  05/09/2014 12:46 PM    [facility]

## 2014-05-09 NOTE — Pre-Procedure Instructions (Signed)
Golden HurterBetty K Below  05/09/2014   Your procedure is scheduled on:  05/19/2014  [facility] Ad[facility]   ENTRANCE A at 7:45 AM.  Call this number if you have problems the morning of surgery: [phone]   Remember:   Do not eat food or drink liquids after midnight.  On SUNDAY   Take these medicines the morning of surgery with A SIP OF WATER: allopurinol, Lyrica  And you may take your pain medicine if you wish    Do not wear jewelry, make-up or nail polish.   Do not wear lotions, powders, or perfumes. You may wear deodorant.   Do not shave 48 hours prior to surgery.    Do not bring valuables to the hospital.  [facility] is not responsible                  for any belongings or valuables.               Contacts, dentures or bridgework may not be worn into surgery.   Leave suitcase in the car. After surgery it may be brought to your room.   For patients admitted to the hospital, discharge time is determined by your                treatment team.               Patients discharged the day of surgery will not be allowed to drive  home.  Name and phone number of your driver: with family  Special Instructions: Special Instructions: [facility] - Preparing for Surgery  Before surgery, you can play an important role.  Because skin is not sterile, your skin needs to be as free of germs as possible.  You can reduce the number of germs on you skin by washing with CHG (chlorahexidine gluconate) soap before surgery.  CHG is an antiseptic cleaner which kills germs and bonds with the skin to continue killing germs even after washing.  Please DO NOT use if you have an allergy to CHG or antibacterial soaps.  If your skin becomes reddened/irritated stop using the CHG and inform your nurse when you arrive at Short Stay.  Do not shave (including legs and underarms) for at least 48 hours prior to the first CHG shower.  You may shave your face.  Please follow these instructions  carefully:   1.  Shower with CHG Soap the night before surgery and the  morning of Surgery.  2.  If you choose to wash your hair, wash your hair first as usual with your  normal shampoo.  3.  After you shampoo, rinse your hair and body thoroughly to remove the  Shampoo.  4.  Use CHG as you would any other liquid soap.  You can apply chg directly to the skin and wash gently with scrungie or a clean washcloth.  5.  Apply the CHG Soap to your body ONLY FROM THE NECK DOWN.    Do not use on open wounds or open sores.  Avoid contact with your eyes, ears, mouth and genitals (private parts).  Wash genitals (private parts)   with your normal soap.  6.  Wash thoroughly, paying special attention to the area where your surgery will be performed.  7.  Thoroughly rinse your body with warm water from the neck down.  8.  DO NOT shower/wash with your normal soap after using and rinsing off   the CHG Soap.  9.  Pat yourself dry with a clean towel.            10.  Wear clean pajamas.            11.  Place clean sheets on your bed the night of your first shower and do not sleep with pets.  Day of Surgery  Do not apply any lotions/deodorants the morning of surgery.  Please wear clean clothes to the hospital/surgery center.   Please read over the following fact sheets that you were given: Pain Booklet, infection information, blood transfusion info. Sheet , Deep breathing information

## 2014-05-09 NOTE — Progress Notes (Signed)
Pt. Went for cardiac eval. Today, directed by Dr. Sherene SiresWainer's office, told that she was cleared for surgery.

## 2014-05-09 NOTE — Patient Instructions (Signed)
Your physician recommends that you schedule a follow-up appointment in: as needed with Dr. Hochrein  

## 2014-05-09 NOTE — Progress Notes (Signed)
Based on pt. Report of "difficulty waking from anesthesia" in the past, states, "they scared me to death" ; requested [facility] From prior surgery for Cholecystectomy & bladder tacking.

## 2014-05-10 LAB — HEMOGLOBIN A1C
Hgb A1c MFr Bld: 6.8 % — ABNORMAL HIGH (ref 4.8–5.6)
MEAN PLASMA GLUCOSE: 148 mg/dL

## 2014-05-12 NOTE — Progress Notes (Signed)
Anesthesia Chart Review:  Pt is 69 year old female scheduled for R total knee arthroplasty on 05/19/2014 with Dr. Thurston HoleWainer.   PMH includes: HTN, DM. BMI 42.   Preoperative labs reviewed.  Glucose 114. HgbA1c 6.8. Many bacteria were found on urinalysis. Urine culture was not done as ordered. Called and left message for pt to call back to arrange a time for urine culture to be collected. Spoke with Julien GirtKirstin Shepperson, PA in Dr. Sherene SiresWainer's office. Pt was placed on Cipro for bacteria in urine.   EKG 09/12/2013. NSR. L axis deviation. Pulmonary disease pattern.   Pt reported in PAT has a hx of "difficulty waking from anesthesia" from prior bladder surgery and [facility]cyst[facility]. Records requested but are not available as they have been purged.   Pt saw Dr. Antoine PocheHochrein for preoperative eval for complaint of chest discomfort. She is cleared for surgery at acceptable risk without further cardiovascular testing in Epic note dated 05/09/2014.   If no changes, I anticipate pt can proceed with surgery as scheduled.   Rica Mastngela [employer], FNP-BC [facility]/Anesthesiology Phone: [phone] 05/12/2014 3:09 PM

## 2014-05-13 NOTE — Addendum Note (Signed)
Addended by: Marella BileVOGEL, Jonpaul Lumm W. on: 05/13/2014 10:22 AM   Modules accepted: Orders

## 2014-05-16 ENCOUNTER — Encounter (HOSPITAL_COMMUNITY)
Admission: RE | Admit: 2014-05-16 | Discharge: 2014-05-16 | Disposition: A | Discharge status: Home / Self Care | Payer: Medicare Other | Source: Ambulatory Visit | Attending: Orthopedic Surgery | Admitting: Orthopedic Surgery

## 2014-05-16 NOTE — Progress Notes (Signed)
Pt notified of time change;to arrive at 0700.Verbalized understanding. 

## 2014-05-18 LAB — URINE CULTURE
Colony Count: NO GROWTH
Culture: NO GROWTH

## 2014-05-18 MED ORDER — VANCOMYCIN HCL 10 G IV SOLR
1500.0000 mg | INTRAVENOUS | Status: AC
  Administered 2014-05-19: 1500 mg via INTRAVENOUS
  Filled 2014-05-18: qty 1500

## 2014-05-19 ENCOUNTER — Inpatient Hospital Stay (HOSPITAL_COMMUNITY): Payer: Medicare Other | Admitting: Emergency Medicine

## 2014-05-19 ENCOUNTER — Inpatient Hospital Stay (HOSPITAL_COMMUNITY): Payer: Medicare Other | Admitting: Anesthesiology

## 2014-05-19 ENCOUNTER — Encounter (HOSPITAL_COMMUNITY): Payer: Self-pay | Admitting: Anesthesiology

## 2014-05-19 ENCOUNTER — Inpatient Hospital Stay (HOSPITAL_COMMUNITY)
Admission: RE | Admit: 2014-05-19 | Discharge: 2014-05-21 | DRG: 470 | Disposition: A | Discharge status: Home Health | Payer: Medicare Other | Source: Ambulatory Visit | Attending: Orthopedic Surgery | Admitting: Orthopedic Surgery

## 2014-05-19 ENCOUNTER — Encounter (HOSPITAL_COMMUNITY)
Admission: RE | Disposition: A | Discharge status: Home Health | Payer: Self-pay | Source: Ambulatory Visit | Attending: Orthopedic Surgery

## 2014-05-19 DIAGNOSIS — Z79899 Other long term (current) drug therapy: Secondary | ICD-10-CM | POA: Diagnosis not present

## 2014-05-19 DIAGNOSIS — K5792 Diverticulitis of intestine, part unspecified, without perforation or abscess without bleeding: Secondary | ICD-10-CM | POA: Diagnosis present

## 2014-05-19 DIAGNOSIS — E119 Type 2 diabetes mellitus without complications: Secondary | ICD-10-CM | POA: Diagnosis not present

## 2014-05-19 DIAGNOSIS — Z880 Allergy status to penicillin: Secondary | ICD-10-CM | POA: Diagnosis not present

## 2014-05-19 DIAGNOSIS — M1712 Unilateral primary osteoarthritis, left knee: Secondary | ICD-10-CM | POA: Diagnosis present

## 2014-05-19 DIAGNOSIS — M25562 Pain in left knee: Secondary | ICD-10-CM | POA: Diagnosis present

## 2014-05-19 DIAGNOSIS — M1710 Unilateral primary osteoarthritis, unspecified knee: Secondary | ICD-10-CM | POA: Diagnosis present

## 2014-05-19 DIAGNOSIS — Z823 Family history of stroke: Secondary | ICD-10-CM

## 2014-05-19 DIAGNOSIS — Z6841 Body Mass Index (BMI) 40.0 and over, adult: Secondary | ICD-10-CM | POA: Diagnosis not present

## 2014-05-19 DIAGNOSIS — Z833 Family history of diabetes mellitus: Secondary | ICD-10-CM

## 2014-05-19 DIAGNOSIS — M1711 Unilateral primary osteoarthritis, right knee: Secondary | ICD-10-CM | POA: Diagnosis present

## 2014-05-19 DIAGNOSIS — I10 Essential (primary) hypertension: Secondary | ICD-10-CM | POA: Diagnosis present

## 2014-05-19 DIAGNOSIS — Z7901 Long term (current) use of anticoagulants: Secondary | ICD-10-CM

## 2014-05-19 DIAGNOSIS — G2581 Restless legs syndrome: Secondary | ICD-10-CM | POA: Diagnosis present

## 2014-05-19 DIAGNOSIS — Z8249 Family history of ischemic heart disease and other diseases of the circulatory system: Secondary | ICD-10-CM | POA: Diagnosis not present

## 2014-05-19 DIAGNOSIS — M545 Low back pain, unspecified: Secondary | ICD-10-CM | POA: Diagnosis present

## 2014-05-19 DIAGNOSIS — M179 Osteoarthritis of knee, unspecified: Secondary | ICD-10-CM | POA: Diagnosis present

## 2014-05-19 DIAGNOSIS — M1990 Unspecified osteoarthritis, unspecified site: Secondary | ICD-10-CM | POA: Diagnosis present

## 2014-05-19 HISTORY — DX: Unilateral primary osteoarthritis, unspecified knee: M17.10

## 2014-05-19 HISTORY — DX: Unilateral primary osteoarthritis, left knee: M17.12

## 2014-05-19 HISTORY — DX: Pain in right leg: M79.604

## 2014-05-19 HISTORY — DX: Osteoarthritis of knee, unspecified: M17.9

## 2014-05-19 HISTORY — DX: Unilateral primary osteoarthritis, right knee: M17.11

## 2014-05-19 HISTORY — DX: Low back pain, unspecified: M54.50

## 2014-05-19 HISTORY — PX: TOTAL KNEE ARTHROPLASTY: SHX125

## 2014-05-19 HISTORY — DX: Low back pain: M54.5

## 2014-05-19 LAB — GLUCOSE, CAPILLARY
GLUCOSE-CAPILLARY: 101 mg/dL — AB (ref 70–99)
GLUCOSE-CAPILLARY: 98 mg/dL (ref 70–99)
Glucose-Capillary: 108 mg/dL — ABNORMAL HIGH (ref 70–99)

## 2014-05-19 SURGERY — ARTHROPLASTY, KNEE, TOTAL
Anesthesia: General | Site: Knee | Laterality: Left

## 2014-05-19 MED ORDER — DIPHENHYDRAMINE HCL 12.5 MG/5ML PO ELIX: 12.5000 mg | ORAL_SOLUTION | ORAL | Status: DC | PRN

## 2014-05-19 MED ORDER — ONDANSETRON HCL 4 MG/2ML IJ SOLN: 4.0000 mg | Freq: Once | INTRAMUSCULAR | Status: DC | PRN

## 2014-05-19 MED ORDER — PHENOL 1.4 % MT LIQD: 1.0000 | OROMUCOSAL | Status: DC | PRN

## 2014-05-19 MED ORDER — MEPERIDINE HCL 25 MG/ML IJ SOLN: 6.2500 mg | INTRAMUSCULAR | Status: DC | PRN

## 2014-05-19 MED ORDER — DEXAMETHASONE SODIUM PHOSPHATE 10 MG/ML IJ SOLN
10.0000 mg | Freq: Three times a day (TID) | INTRAMUSCULAR | Status: DC
  Administered 2014-05-19 – 2014-05-21 (×5): 10 mg via INTRAVENOUS
  Filled 2014-05-19 (×5): qty 1

## 2014-05-19 MED ORDER — OXYCODONE HCL 5 MG PO TABS
ORAL_TABLET | ORAL | Status: AC
  Filled 2014-05-19: qty 2

## 2014-05-19 MED ORDER — FENTANYL CITRATE 0.05 MG/ML IJ SOLN
INTRAMUSCULAR | Status: DC | PRN
  Administered 2014-05-19: 50 ug via INTRAVENOUS

## 2014-05-19 MED ORDER — LISINOPRIL 20 MG PO TABS
20.0000 mg | ORAL_TABLET | Freq: Every day | ORAL | Status: DC
  Administered 2014-05-20 – 2014-05-21 (×2): 20 mg via ORAL
  Filled 2014-05-19: qty 1

## 2014-05-19 MED ORDER — ALUM & MAG HYDROXIDE-SIMETH 200-200-20 MG/5ML PO SUSP: 30.0000 mL | ORAL | Status: DC | PRN

## 2014-05-19 MED ORDER — LISINOPRIL-HYDROCHLOROTHIAZIDE 20-25 MG PO TABS: 1.0000 | ORAL_TABLET | Freq: Every day | ORAL | Status: DC

## 2014-05-19 MED ORDER — HYDROMORPHONE HCL 1 MG/ML IJ SOLN
0.2500 mg | INTRAMUSCULAR | Status: DC | PRN
  Administered 2014-05-19 (×4): 0.5 mg via INTRAVENOUS

## 2014-05-19 MED ORDER — INSULIN GLARGINE 100 UNIT/ML ~~LOC~~ SOLN
10.0000 [IU] | Freq: Every day | SUBCUTANEOUS | Status: DC
  Administered 2014-05-20: 10 [IU] via SUBCUTANEOUS
  Filled 2014-05-19 (×3): qty 0.1

## 2014-05-19 MED ORDER — INSULIN ASPART 100 UNIT/ML ~~LOC~~ SOLN
0.0000 [IU] | Freq: Three times a day (TID) | SUBCUTANEOUS | Status: DC
  Administered 2014-05-20 – 2014-05-21 (×5): 4 [IU] via SUBCUTANEOUS

## 2014-05-19 MED ORDER — LACTATED RINGERS IV SOLN: INTRAVENOUS | Status: DC

## 2014-05-19 MED ORDER — SODIUM CHLORIDE 0.9 % IJ SOLN
INTRAMUSCULAR | Status: AC
  Filled 2014-05-19: qty 10

## 2014-05-19 MED ORDER — DOCUSATE SODIUM 100 MG PO CAPS
100.0000 mg | ORAL_CAPSULE | Freq: Two times a day (BID) | ORAL | Status: DC
  Administered 2014-05-19 – 2014-05-21 (×4): 100 mg via ORAL
  Filled 2014-05-19 (×3): qty 1

## 2014-05-19 MED ORDER — CHLORHEXIDINE GLUCONATE 4 % EX LIQD
60.0000 mL | Freq: Once | CUTANEOUS | Status: DC
  Filled 2014-05-19: qty 60

## 2014-05-19 MED ORDER — MIDAZOLAM HCL 2 MG/2ML IJ SOLN
1.0000 mg | INTRAMUSCULAR | Status: DC | PRN
  Administered 2014-05-19: 2 mg via INTRAVENOUS
  Filled 2014-05-19: qty 2

## 2014-05-19 MED ORDER — MENTHOL 3 MG MT LOZG: 1.0000 | LOZENGE | OROMUCOSAL | Status: DC | PRN

## 2014-05-19 MED ORDER — DEXAMETHASONE SODIUM PHOSPHATE 10 MG/ML IJ SOLN
INTRAMUSCULAR | Status: AC
  Filled 2014-05-19: qty 1

## 2014-05-19 MED ORDER — CELECOXIB 200 MG PO CAPS
200.0000 mg | ORAL_CAPSULE | Freq: Two times a day (BID) | ORAL | Status: DC
  Administered 2014-05-19 – 2014-05-21 (×4): 200 mg via ORAL
  Filled 2014-05-19 (×4): qty 1

## 2014-05-19 MED ORDER — OXYCODONE HCL 5 MG PO TABS
5.0000 mg | ORAL_TABLET | ORAL | Status: DC | PRN
  Administered 2014-05-19: 5 mg via ORAL
  Administered 2014-05-19 – 2014-05-20 (×2): 10 mg via ORAL
  Administered 2014-05-20: 5 mg via ORAL
  Administered 2014-05-20 – 2014-05-21 (×4): 10 mg via ORAL
  Filled 2014-05-19 (×6): qty 2

## 2014-05-19 MED ORDER — METOCLOPRAMIDE HCL 5 MG/ML IJ SOLN: 5.0000 mg | Freq: Three times a day (TID) | INTRAMUSCULAR | Status: DC | PRN

## 2014-05-19 MED ORDER — VITAMIN D 1000 UNITS PO TABS
2000.0000 [IU] | ORAL_TABLET | Freq: Every day | ORAL | Status: DC
  Administered 2014-05-19 – 2014-05-21 (×3): 2000 [IU] via ORAL
  Filled 2014-05-19 (×2): qty 2

## 2014-05-19 MED ORDER — METOCLOPRAMIDE HCL 10 MG PO TABS: 5.0000 mg | ORAL_TABLET | Freq: Three times a day (TID) | ORAL | Status: DC | PRN

## 2014-05-19 MED ORDER — DEXAMETHASONE SODIUM PHOSPHATE 10 MG/ML IJ SOLN
INTRAMUSCULAR | Status: DC | PRN
  Administered 2014-05-19: 10 mg via INTRAVENOUS

## 2014-05-19 MED ORDER — HYDROMORPHONE HCL 1 MG/ML IJ SOLN
1.0000 mg | INTRAMUSCULAR | Status: DC | PRN
  Administered 2014-05-19 – 2014-05-20 (×2): 1 mg via INTRAVENOUS
  Filled 2014-05-19 (×2): qty 1

## 2014-05-19 MED ORDER — PROPOFOL 10 MG/ML IV BOLUS
INTRAVENOUS | Status: AC
  Filled 2014-05-19: qty 20

## 2014-05-19 MED ORDER — HYDROCHLOROTHIAZIDE 25 MG PO TABS
25.0000 mg | ORAL_TABLET | Freq: Every day | ORAL | Status: DC
  Administered 2014-05-20 – 2014-05-21 (×2): 25 mg via ORAL
  Filled 2014-05-19 (×2): qty 1

## 2014-05-19 MED ORDER — PREGABALIN 75 MG PO CAPS
150.0000 mg | ORAL_CAPSULE | Freq: Three times a day (TID) | ORAL | Status: DC
  Administered 2014-05-19 – 2014-05-21 (×5): 150 mg via ORAL
  Filled 2014-05-19 (×5): qty 2

## 2014-05-19 MED ORDER — OXYCODONE HCL 5 MG PO TABS
ORAL_TABLET | ORAL | Status: AC
  Administered 2014-05-19: 10 mg via ORAL
  Filled 2014-05-19: qty 1

## 2014-05-19 MED ORDER — EPHEDRINE SULFATE 50 MG/ML IJ SOLN
INTRAMUSCULAR | Status: AC
  Filled 2014-05-19: qty 1

## 2014-05-19 MED ORDER — HYDROMORPHONE HCL 1 MG/ML IJ SOLN
INTRAMUSCULAR | Status: AC
  Filled 2014-05-19: qty 1

## 2014-05-19 MED ORDER — BUPIVACAINE-EPINEPHRINE (PF) 0.5% -1:200000 IJ SOLN
INTRAMUSCULAR | Status: DC | PRN
  Administered 2014-05-19: 30 mL via PERINEURAL

## 2014-05-19 MED ORDER — FENTANYL CITRATE 0.05 MG/ML IJ SOLN
INTRAMUSCULAR | Status: AC
  Filled 2014-05-19: qty 5

## 2014-05-19 MED ORDER — ROCURONIUM BROMIDE 50 MG/5ML IV SOLN
INTRAVENOUS | Status: AC
Start: 2014-05-19 — End: 2014-05-19
  Filled 2014-05-19: qty 1

## 2014-05-19 MED ORDER — ALLOPURINOL 100 MG PO TABS
100.0000 mg | ORAL_TABLET | Freq: Two times a day (BID) | ORAL | Status: DC
  Administered 2014-05-19 – 2014-05-21 (×4): 100 mg via ORAL
  Filled 2014-05-19 (×4): qty 1

## 2014-05-19 MED ORDER — ONDANSETRON HCL 4 MG/2ML IJ SOLN
INTRAMUSCULAR | Status: AC
  Filled 2014-05-19: qty 2

## 2014-05-19 MED ORDER — GLYCOPYRROLATE 0.2 MG/ML IJ SOLN
INTRAMUSCULAR | Status: AC
  Filled 2014-05-19: qty 1

## 2014-05-19 MED ORDER — CEFUROXIME SODIUM 1.5 G IJ SOLR
INTRAMUSCULAR | Status: DC | PRN
  Administered 2014-05-19: 1.5 g

## 2014-05-19 MED ORDER — ONDANSETRON HCL 4 MG/2ML IJ SOLN: 4.0000 mg | Freq: Four times a day (QID) | INTRAMUSCULAR | Status: DC | PRN

## 2014-05-19 MED ORDER — PHENYLEPHRINE HCL 10 MG/ML IJ SOLN
INTRAMUSCULAR | Status: DC | PRN
  Administered 2014-05-19 (×2): 40 ug via INTRAVENOUS

## 2014-05-19 MED ORDER — ACETAMINOPHEN 325 MG PO TABS
650.0000 mg | ORAL_TABLET | Freq: Four times a day (QID) | ORAL | Status: DC | PRN
  Administered 2014-05-20: 650 mg via ORAL
  Filled 2014-05-19: qty 2

## 2014-05-19 MED ORDER — PROPOFOL 10 MG/ML IV BOLUS
INTRAVENOUS | Status: DC | PRN
  Administered 2014-05-19: 10 mg via INTRAVENOUS
  Administered 2014-05-19: 20 mg via INTRAVENOUS
  Administered 2014-05-19: 10 mg via INTRAVENOUS

## 2014-05-19 MED ORDER — POVIDONE-IODINE 7.5 % EX SOLN
Freq: Once | CUTANEOUS | Status: DC
  Filled 2014-05-19: qty 118

## 2014-05-19 MED ORDER — VANCOMYCIN HCL IN DEXTROSE 1-5 GM/200ML-% IV SOLN
1000.0000 mg | Freq: Two times a day (BID) | INTRAVENOUS | Status: AC
  Filled 2014-05-19: qty 200

## 2014-05-19 MED ORDER — POLYETHYLENE GLYCOL 3350 17 G PO PACK
17.0000 g | PACK | Freq: Two times a day (BID) | ORAL | Status: DC
  Administered 2014-05-19 – 2014-05-21 (×2): 17 g via ORAL
  Filled 2014-05-19 (×2): qty 1

## 2014-05-19 MED ORDER — ACETAMINOPHEN 650 MG RE SUPP: 650.0000 mg | Freq: Four times a day (QID) | RECTAL | Status: DC | PRN

## 2014-05-19 MED ORDER — SODIUM CHLORIDE 0.9 % IV SOLN
INTRAVENOUS | Status: DC | PRN
  Administered 2014-05-19: 08:00:00 via INTRAVENOUS

## 2014-05-19 MED ORDER — ROPINIROLE HCL 1 MG PO TABS
2.0000 mg | ORAL_TABLET | Freq: Every day | ORAL | Status: DC
  Administered 2014-05-19 – 2014-05-20 (×2): 2 mg via ORAL
  Filled 2014-05-19 (×2): qty 2

## 2014-05-19 MED ORDER — MIDAZOLAM HCL 2 MG/2ML IJ SOLN
INTRAMUSCULAR | Status: AC
  Filled 2014-05-19: qty 2

## 2014-05-19 MED ORDER — LACTATED RINGERS IV SOLN
INTRAVENOUS | Status: DC
  Administered 2014-05-19: 07:00:00 via INTRAVENOUS

## 2014-05-19 MED ORDER — LIDOCAINE HCL (CARDIAC) 20 MG/ML IV SOLN
INTRAVENOUS | Status: AC
  Filled 2014-05-19: qty 5

## 2014-05-19 MED ORDER — SUCCINYLCHOLINE CHLORIDE 20 MG/ML IJ SOLN
INTRAMUSCULAR | Status: AC
  Filled 2014-05-19: qty 1

## 2014-05-19 MED ORDER — POTASSIUM CHLORIDE IN NACL 20-0.9 MEQ/L-% IV SOLN
INTRAVENOUS | Status: DC
  Administered 2014-05-19: 21:00:00 via INTRAVENOUS
  Filled 2014-05-19 (×6): qty 1000

## 2014-05-19 MED ORDER — ONDANSETRON HCL 4 MG PO TABS: 4.0000 mg | ORAL_TABLET | Freq: Four times a day (QID) | ORAL | Status: DC | PRN

## 2014-05-19 MED ORDER — INSULIN ASPART 100 UNIT/ML ~~LOC~~ SOLN: 0.0000 [IU] | Freq: Every day | SUBCUTANEOUS | Status: DC

## 2014-05-19 MED ORDER — APIXABAN 2.5 MG PO TABS
2.5000 mg | ORAL_TABLET | Freq: Two times a day (BID) | ORAL | Status: DC
  Administered 2014-05-20 – 2014-05-21 (×3): 2.5 mg via ORAL
  Filled 2014-05-19 (×4): qty 1

## 2014-05-19 MED ORDER — VANCOMYCIN HCL 10 G IV SOLR: 1500.0000 mg | INTRAVENOUS | Status: DC

## 2014-05-19 MED ORDER — OXYCODONE HCL ER 10 MG PO T12A
20.0000 mg | EXTENDED_RELEASE_TABLET | Freq: Two times a day (BID) | ORAL | Status: DC
  Administered 2014-05-19 – 2014-05-21 (×4): 20 mg via ORAL
  Filled 2014-05-19 (×4): qty 2

## 2014-05-19 MED ORDER — EPHEDRINE SULFATE 50 MG/ML IJ SOLN
INTRAMUSCULAR | Status: DC | PRN
  Administered 2014-05-19 (×4): 5 mg via INTRAVENOUS

## 2014-05-19 MED ORDER — PHENYLEPHRINE 40 MCG/ML (10ML) SYRINGE FOR IV PUSH (FOR BLOOD PRESSURE SUPPORT)
PREFILLED_SYRINGE | INTRAVENOUS | Status: AC
  Filled 2014-05-19: qty 10

## 2014-05-19 MED ORDER — ONDANSETRON HCL 4 MG/2ML IJ SOLN
INTRAMUSCULAR | Status: DC | PRN
  Administered 2014-05-19: 4 mg via INTRAVENOUS

## 2014-05-19 MED ORDER — LIDOCAINE HCL (CARDIAC) 20 MG/ML IV SOLN
INTRAVENOUS | Status: DC | PRN
  Administered 2014-05-19: 30 mg via INTRAVENOUS

## 2014-05-19 MED ORDER — BUPIVACAINE-EPINEPHRINE (PF) 0.25% -1:200000 IJ SOLN
INTRAMUSCULAR | Status: AC
  Filled 2014-05-19: qty 30

## 2014-05-19 MED ORDER — FENTANYL CITRATE 0.05 MG/ML IJ SOLN
50.0000 ug | INTRAMUSCULAR | Status: DC | PRN
  Administered 2014-05-19: 100 ug via INTRAVENOUS
  Filled 2014-05-19: qty 2

## 2014-05-19 MED ORDER — LACTATED RINGERS IV SOLN
INTRAVENOUS | Status: DC | PRN
  Administered 2014-05-19 (×2): via INTRAVENOUS

## 2014-05-19 MED ORDER — CEFUROXIME SODIUM 1.5 G IJ SOLR
INTRAMUSCULAR | Status: AC
  Filled 2014-05-19: qty 1.5

## 2014-05-19 MED ORDER — BUPIVACAINE-EPINEPHRINE 0.25% -1:200000 IJ SOLN
INTRAMUSCULAR | Status: DC | PRN
Start: 2014-05-19 — End: 2014-05-19
  Administered 2014-05-19: 30 mL

## 2014-05-19 MED ORDER — SODIUM CHLORIDE 0.9 % IR SOLN
Status: DC | PRN
  Administered 2014-05-19: 3000 mL

## 2014-05-19 MED ORDER — PROPOFOL INFUSION 10 MG/ML OPTIME
INTRAVENOUS | Status: DC | PRN
  Administered 2014-05-19: 50 ug/kg/min via INTRAVENOUS

## 2014-05-19 SURGICAL SUPPLY — 80 items
APL SKNCLS STERI-STRIP NONHPOA (GAUZE/BANDAGES/DRESSINGS) ×1
BANDAGE ELASTIC 6 VELCRO ST LF (GAUZE/BANDAGES/DRESSINGS) ×2 IMPLANT
BANDAGE ESMARK 6X9 LF (GAUZE/BANDAGES/DRESSINGS) ×1 IMPLANT
BARDEX I.C. COMPLETE CARE ×2 IMPLANT
BENZOIN TINCTURE PRP APPL 2/3 (GAUZE/BANDAGES/DRESSINGS) ×3 IMPLANT
BLADE SAGITTAL 25.0X1.19X90 (BLADE) ×2 IMPLANT
BLADE SAGITTAL 25.0X1.19X90MM (BLADE) ×1
BLADE SAW SGTL 11.0X1.19X90.0M (BLADE) IMPLANT
BLADE SAW SGTL 13.0X1.19X90.0M (BLADE) ×3 IMPLANT
BLADE SURG 10 STRL SS (BLADE) ×6 IMPLANT
BNDG CMPR 9X6 STRL LF SNTH (GAUZE/BANDAGES/DRESSINGS) ×1
BNDG CMPR MED 15X6 ELC VLCR LF (GAUZE/BANDAGES/DRESSINGS) ×1
BNDG ELASTIC 6X15 VLCR STRL LF (GAUZE/BANDAGES/DRESSINGS) ×3 IMPLANT
BNDG ESMARK 6X9 LF (GAUZE/BANDAGES/DRESSINGS) ×3
BOWL SMART MIX CTS (DISPOSABLE) ×3 IMPLANT
CAP KNEE TOTAL 3 SIGMA ×2 IMPLANT
CEMENT HV SMART SET (Cement) ×6 IMPLANT
CLOSURE WOUND 1/2 X4 (GAUZE/BANDAGES/DRESSINGS) ×1
COVER SURGICAL LIGHT HANDLE (MISCELLANEOUS) ×3 IMPLANT
CUFF TOURNIQUET SINGLE 34IN LL (TOURNIQUET CUFF) ×3 IMPLANT
CUFF TOURNIQUET SINGLE 44IN (TOURNIQUET CUFF) IMPLANT
DRAPE EXTREMITY T 121X128X90 (DRAPE) ×3 IMPLANT
DRAPE IMP U-DRAPE 54X76 (DRAPES) ×3 IMPLANT
DRAPE INCISE IOBAN 66X45 STRL (DRAPES) ×3 IMPLANT
DRAPE PROXIMA HALF (DRAPES) ×3 IMPLANT
DRAPE U-SHAPE 47X51 STRL (DRAPES) ×3 IMPLANT
DRSG AQUACEL AG ADV 3.5X14 (GAUZE/BANDAGES/DRESSINGS) ×5 IMPLANT
DRSG PAD ABDOMINAL 8X10 ST (GAUZE/BANDAGES/DRESSINGS) ×8 IMPLANT
DURAPREP 26ML APPLICATOR (WOUND CARE) ×6 IMPLANT
ELECT CAUTERY BLADE 6.4 (BLADE) ×3 IMPLANT
ELECT REM PT RETURN 9FT ADLT (ELECTROSURGICAL) ×3
ELECTRODE REM PT RTRN 9FT ADLT (ELECTROSURGICAL) ×1 IMPLANT
EVACUATOR 1/8 PVC DRAIN (DRAIN) ×3 IMPLANT
FACESHIELD WRAPAROUND (MASK) ×3 IMPLANT
FACESHIELD WRAPAROUND OR TEAM (MASK) ×1 IMPLANT
FIXATION PIN ×2 IMPLANT
GAUZE SPONGE 4X4 12PLY STRL (GAUZE/BANDAGES/DRESSINGS) ×3 IMPLANT
GLOVE BIO SURGEON STRL SZ 6 (GLOVE) ×2 IMPLANT
GLOVE BIO SURGEON STRL SZ7 (GLOVE) ×5 IMPLANT
GLOVE BIOGEL PI IND STRL 7.0 (GLOVE) ×1 IMPLANT
GLOVE BIOGEL PI IND STRL 7.5 (GLOVE) ×1 IMPLANT
GLOVE BIOGEL PI INDICATOR 7.0 (GLOVE) ×6
GLOVE BIOGEL PI INDICATOR 7.5 (GLOVE) ×2
GLOVE SS BIOGEL STRL SZ 6.5 (GLOVE) IMPLANT
GLOVE SS BIOGEL STRL SZ 7.5 (GLOVE) ×1 IMPLANT
GLOVE SUPERSENSE BIOGEL SZ 6.5 (GLOVE) ×4
GLOVE SUPERSENSE BIOGEL SZ 7.5 (GLOVE) ×2
GOWN STRL REUS W/ TWL LRG LVL3 (GOWN DISPOSABLE) ×2 IMPLANT
GOWN STRL REUS W/ TWL XL LVL3 (GOWN DISPOSABLE) ×2 IMPLANT
GOWN STRL REUS W/TWL LRG LVL3 (GOWN DISPOSABLE) ×6
GOWN STRL REUS W/TWL XL LVL3 (GOWN DISPOSABLE) ×6
HANDPIECE INTERPULSE COAX TIP (DISPOSABLE) ×3
HOOD PEEL AWAY FACE SHEILD DIS (HOOD) ×6 IMPLANT
IMMOBILIZER KNEE 22 UNIV (SOFTGOODS) ×2 IMPLANT
KIT BASIN OR (CUSTOM PROCEDURE TRAY) ×3 IMPLANT
KIT ROOM TURNOVER OR (KITS) ×3 IMPLANT
MANIFOLD NEPTUNE II (INSTRUMENTS) ×3 IMPLANT
MARKER SKIN DUAL TIP RULER LAB (MISCELLANEOUS) ×3 IMPLANT
NS IRRIG 1000ML POUR BTL (IV SOLUTION) ×3 IMPLANT
PACK TOTAL JOINT (CUSTOM PROCEDURE TRAY) ×3 IMPLANT
PACK UNIVERSAL I (CUSTOM PROCEDURE TRAY) ×3 IMPLANT
PAD ARMBOARD 7.5X6 YLW CONV (MISCELLANEOUS) ×6 IMPLANT
PADDING CAST COTTON 6X4 STRL (CAST SUPPLIES) ×3 IMPLANT
RUBBERBAND STERILE (MISCELLANEOUS) ×3 IMPLANT
SET HNDPC FAN SPRY TIP SCT (DISPOSABLE) ×1 IMPLANT
STRIP CLOSURE SKIN 1/2X4 (GAUZE/BANDAGES/DRESSINGS) ×2 IMPLANT
SUCTION FRAZIER TIP 10 FR DISP (SUCTIONS) ×3 IMPLANT
SUT ETHIBOND NAB CT1 #1 30IN (SUTURE) ×8 IMPLANT
SUT MNCRL AB 3-0 PS2 18 (SUTURE) ×3 IMPLANT
SUT VIC AB 0 CT1 27 (SUTURE) ×6
SUT VIC AB 0 CT1 27XBRD ANBCTR (SUTURE) ×2 IMPLANT
SUT VIC AB 1 CT1 27 (SUTURE) ×3
SUT VIC AB 1 CT1 27XBRD ANBCTR (SUTURE) IMPLANT
SUT VIC AB 2-0 CT1 27 (SUTURE) ×6
SUT VIC AB 2-0 CT1 TAPERPNT 27 (SUTURE) ×2 IMPLANT
SYR 30ML SLIP (SYRINGE) ×3 IMPLANT
TOWEL OR 17X24 6PK STRL BLUE (TOWEL DISPOSABLE) ×3 IMPLANT
TOWEL OR 17X26 10 PK STRL BLUE (TOWEL DISPOSABLE) ×3 IMPLANT
TRAY FOLEY CATH 16FR SILVER (SET/KITS/TRAYS/PACK) ×1 IMPLANT
WATER STERILE IRR 1000ML POUR (IV SOLUTION) ×6 IMPLANT

## 2014-05-19 NOTE — Op Note (Signed)
MRN:     [id number] DOB/AGE:    May 29, 1945 / 69 y.o.       OPERATIVE REPORT    DATE OF PROCEDURE:  05/19/2014       PREOPERATIVE DIAGNOSIS:  PRIMARY LOCALIZED OA LEFT KNEE      Estimated body mass index is 41.99 kg/(m^2) as calculated from the following:   Height as of this encounter:  (1.6 m).   Weight as of this encounter: 107.502 kg (237 lb).                                                        POSTOPERATIVE DIAGNOSIS:   SAME                                                                     PROCEDURE:  Procedure(s): LEFT TOTAL KNEE ARTHROPLASTY Using Depuy Sigma RP implants #4 narrow Femur, #4Tibia, 10mm sigma RP bearing, 32 Patella     SURGEON: Jovani Flury A    ASSISTANT:  Kirstin Shepperson PA-C   (Present and scrubbed throughout the case, critical for assistance with exposure, retraction, instrumentation, and closure.)         ANESTHESIA: Spinal with Femoral Nerve Block  DRAINS: foley, 2 medium hemovac in knee   TOURNIQUET TIME:   COMPLICATIONS:  None     SPECIMENS: None   INDICATIONS FOR PROCEDURE: The patient has  OA LEFT KNEE, varus deformities, XR shows bone on bone arthritis. Patient has failed all conservative measures including anti-inflammatory medicines, narcotics, attempts at  exercise and weight loss, cortisone injections and viscosupplementation.  Risks and benefits of surgery have been discussed, questions answered.   DESCRIPTION OF PROCEDURE: The patient identified by armband, received  right femoral nerve block and IV antibiotics, in the holding area at [facility]. Patient taken to the operating room, appropriate anesthetic  monitors were attached General endotracheal anesthesia induced with  the patient in supine position, Foley catheter was inserted. Tourniquet  applied high to the operative thigh. Lateral post and foot positioner  applied to the table, the lower extremity was then prepped and draped  in usual sterile fashion from  the ankle to the tourniquet. Time-out procedure was performed. The limb was wrapped with an Esmarch bandage and the tourniquet inflated to 365 mmHg. We began the operation by making the anterior midline incision starting at handbreadth above the patella going over the patella 1 cm medial to and  4 cm distal to the tibial tubercle. Small bleeders in the skin and the  subcutaneous tissue identified and cauterized. Transverse retinaculum was incised and reflected medially and a medial parapatellar arthrotomy was accomplished. the patella was everted and theprepatellar fat pad resected. The superficial medial collateral  ligament was then elevated from anterior to posterior along the proximal  flare of the tibia and anterior half of the menisci resected. The knee was hyperflexed exposing bone on bone arthritis. Peripheral and notch osteophytes as well as the cruciate ligaments were then resected. We continued to  work our way around posteriorly along the proximal tibia, and externally  rotated the tibia subluxing it out from underneath the femur. A McHale  retractor was placed through the notch and a lateral Hohmann retractor  placed, and we then drilled through the proximal tibia in line with the  axis of the tibia followed by an intramedullary guide rod and 2-degree  posterior slope cutting guide. The tibial cutting guide was pinned into place  allowing resection of 4 mm of bone medially and about 6 mm of bone  laterally because of her varus deformity. Satisfied with the tibial resection, we then  entered the distal femur 2 mm anterior to the PCL origin with the  intramedullary guide rod and applied the distal femoral cutting guide  set at 11mm, with 5 degrees of valgus. This was pinned along the  epicondylar axis. At this point, the distal femoral cut was accomplished without difficulty. We then sized for a #4 narrow femoral component and pinned the guide in 3 degrees of external rotation.The chamfer  cutting guide was pinned into place. The anterior, posterior, and chamfer cuts were accomplished without difficulty followed by  the Sigma RP box cutting guide and the box cut. We also removed posterior osteophytes from the posterior femoral condyles. At this  time, the knee was brought into full extension. We checked our  extension and flexion gaps and found them symmetric at 10mm.  The patella thickness measured at 23 mm. We set the cutting guide at 15 and removed the posterior 8 mm  of the patella sized for 32 button and drilled the lollipop. The knee  was then once again hyperflexed exposing the proximal tibia. We sized for a #4 tibial base plate, applied the smokestack and the conical reamer followed by the the Delta fin keel punch. We then hammered into place the Sigma RP trial femoral component, inserted a 10-mm trial bearing, trial patellar button, and took the knee through range of motion from 0-130 degrees. No thumb pressure was required for patellar  tracking. At this point, all trial components were removed, a double batch of DePuy HV cement  was mixed and applied to all bony metallic mating surfaces except for the posterior condyles of the femur itself. In order, we  hammered into place the tibial tray and removed excess cement, the femoral component and removed excess cement, a 10-mm Sigma RP bearing  was inserted, and the knee brought to full extension with compression.  The patellar button was clamped into place, and excess cement  removed. While the cement cured the wound was irrigated out with normal saline solution pulse lavage, and medium Hemovac drains were placed.. Ligament stability and patellar tracking were checked and found to be excellent. The tourniquet was then released and hemostasis was obtained with cautery. The parapatellar arthrotomy was closed with  #1 ethibond suture. The subcutaneous tissue with 0 and 2-0 undyed  Vicryl suture, and 4-0 Monocryl.. A dressing of  Xeroform,  4 x 4, dressing sponges, Webril, and Ace wrap applied. Needle and sponge count were correct times 2.The patient awakened, extubated, and taken to recovery room without difficulty. Vascular status was normal, pulses 2+ and symmetric.   Devyn Sheerin A 05/19/2014, 11:12 AM

## 2014-05-19 NOTE — Anesthesia Preprocedure Evaluation (Addendum)
Anesthesia Evaluation  Patient identified by MRN, date of birth, ID band Patient awake    Reviewed: Allergy & Precautions, NPO status , Patient's Chart, lab work & pertinent test results  Airway Mallampati: III  TM Distance: >3 FB Neck ROM: Full    Dental  (+) Teeth Intact, Dental Advisory Given, Partial Upper   Pulmonary neg pulmonary ROS,  breath sounds clear to auscultation        Cardiovascular hypertension, Pt. on medications Rhythm:Regular Rate:Normal     Neuro/Psych negative neurological ROS  negative psych ROS   GI/Hepatic negative GI ROS, Neg liver ROS,   Endo/Other  diabetes, Type 2, Oral Hypoglycemic AgentsMorbid obesity  Renal/GU negative Renal ROS     Musculoskeletal  (+) Arthritis -,   Abdominal   Peds  Hematology   Anesthesia Other Findings   Reproductive/Obstetrics negative OB ROS                           Anesthesia Physical Anesthesia Plan  ASA: III  Anesthesia Plan: Spinal and MAC   Post-op Pain Management: MAC Combined w/ Regional for Post-op pain   Induction:   Airway Management Planned: Mask  Additional Equipment:   Intra-op Plan:   Post-operative Plan:   Informed Consent: I have reviewed the patients History and Physical, chart, labs and discussed the procedure including the risks, benefits and alternatives for the proposed anesthesia with the patient or authorized representative who has indicated his/her understanding and acceptance.   Dental advisory given  Plan Discussed with: CRNA, Anesthesiologist and Surgeon  Anesthesia Plan Comments:         Anesthesia Quick Evaluation

## 2014-05-19 NOTE — Anesthesia Postprocedure Evaluation (Signed)
  Anesthesia Post-op Note  Patient: Amber Ashley  Procedure(s) Performed: Procedure(s): LEFT TOTAL KNEE ARTHROPLASTY (Left)  Patient Location: PACU  Anesthesia Type: No value filed.   Level of Consciousness: awake, alert  and oriented  Airway and Oxygen Therapy: Patient Spontanous Breathing  Post-op Pain: mild  Post-op Assessment: Post-op Vital signs reviewed  Post-op Vital Signs: Reviewed  Last Vitals:  Filed Vitals:   05/19/14 1315  BP:   Pulse:   Temp: 36.1 C  Resp:     Complications: No apparent anesthesia complications. However, she did complain of chest tightness that is similar to what she feels several times per month.  She has seen Dr. Antoine PocheHochrein for this and he thinks it is very atypical for CAD.  She thinks there may be a component of anxiety with this as well.  Clinically, I think she can leave the PACU and return to the floor safely.

## 2014-05-19 NOTE — Anesthesia Procedure Notes (Addendum)
Anesthesia Regional Block:  Adductor canal block  Pre-Anesthetic Checklist: ,, timeout performed, Correct Patient, Correct Site, Correct Laterality, Correct Procedure, Correct Position, site marked, Risks and benefits discussed,  Surgical consent,  Pre-op evaluation,  At surgeon's request and post-op pain management  Laterality: Left  Prep: chloraprep       Needles:  Injection technique: Single-shot  Needle Type: Echogenic Stimulator Needle     Needle Length: 9cm 9 cm Needle Gauge: 21 and 21 G    Additional Needles:  Procedures: ultrasound guided (picture in chart) Adductor canal block Narrative:  Start time: 05/19/2014 8:25 AM End time: 05/19/2014 8:35 AM Injection made incrementally with aspirations every 5 mL.  Performed by: Personally  Anesthesiologist: Arta BruceSSEY, KEVIN  Additional Notes: Monitors applied. Patient sedated. Sterile prep and drape,hand hygiene and sterile gloves were used. Relevant anatomy identified.Needle position confirmed.Local anesthetic injected incrementally after negative aspiration. Local anesthetic spread visualized around nerve(s). Vascular puncture avoided. No complications. Image printed for medical record.The patient tolerated the procedure well.    Arta BruceKevin Ossey MD   Spinal Patient location during procedure: OR Start time: 05/19/2014 9:35 AM End time: 05/19/2014 9:45 AM Staffing Anesthesiologist: Arta BruceSSEY, KEVIN Performed by: anesthesiologist  Preanesthetic Checklist Completed: patient identified, surgical consent, pre-op evaluation, timeout performed, IV checked, risks and benefits discussed and monitors and equipment checked Spinal Block Patient position: sitting Prep: ChloraPrep Patient monitoring: heart rate, cardiac monitor, continuous pulse ox and blood pressure Approach: left paramedian Location: L3-4 Injection technique: single-shot Needle Needle type: Pencan  Needle gauge: 24 G Needle length: 9 cm Needle insertion depth: 8  cm Assessment Sensory level: T8  Procedure Name: MAC Date/Time: 05/19/2014 9:40 AM Performed by: Lovie CholOCK, Abdimalik Mayorquin K Pre-anesthesia Checklist: Patient identified, Emergency Drugs available, Suction available, Patient being monitored and Timeout performed Patient Re-evaluated:Patient Re-evaluated prior to inductionOxygen Delivery Method: Simple face mask

## 2014-05-19 NOTE — Interval H&P Note (Signed)
History and Physical Interval Note:  05/19/2014 9:25 AM  Amber Ashley  has presented today for surgery, with the diagnosis of OA LEFT KNEE  The various methods of treatment have been discussed with the patient and family. After consideration of risks, benefits and other options for treatment, the patient has consented to  Procedure(s): LEFT TOTAL KNEE ARTHROPLASTY (Left) as a surgical intervention .  The patient's history has been reviewed, patient examined, no change in status, stable for surgery.  I have reviewed the patient's chart and labs.  Questions were answered to the patient's satisfaction.     [name] A   

## 2014-05-19 NOTE — Interval H&P Note (Signed)
History and Physical Interval Note:  05/19/2014 9:25 AM  Amber Ashley  has presented today for surgery, with the diagnosis of OA LEFT KNEE  The various methods of treatment have been discussed with the patient and family. After consideration of risks, benefits and other options for treatment, the patient has consented to  Procedure(s): LEFT TOTAL KNEE ARTHROPLASTY (Left) as a surgical intervention .  The patient's history has been reviewed, patient examined, no change in status, stable for surgery.  I have reviewed the patient's chart and labs.  Questions were answered to the patient's satisfaction.     Salvatore MarvelWAINER,Cheryel Kyte A

## 2014-05-19 NOTE — Progress Notes (Signed)
Utilization review completed.  

## 2014-05-19 NOTE — Progress Notes (Signed)
Orthopedic Tech Progress Note Patient Details:  Amber Ashley 04/24/1945 [id number]  CPM Left Knee CPM Left Knee: On Left Knee Flexion (Degrees): 90 Left Knee Extension (Degrees): 0 Additional Comments: trapeze bar patient helper Viewed order from doctor's order list  Nikki DomCrawford, Ashantee Deupree 05/19/2014, 12:38 PM

## 2014-05-19 NOTE — Transfer of Care (Signed)
Immediate Anesthesia Transfer of Care Note  Patient: Amber Ashley  Procedure(s) Performed: Procedure(s): LEFT TOTAL KNEE ARTHROPLASTY (Left)  Patient Location: PACU  Anesthesia Type:MAC and Spinal  Level of Consciousness: awake, oriented and patient cooperative  Airway & Oxygen Therapy: Patient Spontanous Breathing and Patient connected to face mask oxygen  Post-op Assessment: Report given to RN and Post -op Vital signs reviewed and stable  Post vital signs: Reviewed  Last Vitals:  Filed Vitals:   05/19/14 0845  BP: 99/41  Pulse: 73  Temp:   Resp: 22    Complications: No apparent anesthesia complications

## 2014-05-20 ENCOUNTER — Encounter (HOSPITAL_COMMUNITY): Payer: Self-pay | Admitting: General Practice

## 2014-05-20 LAB — CBC
HEMATOCRIT: 33.6 % — AB (ref 36.0–46.0)
Hemoglobin: 10.9 g/dL — ABNORMAL LOW (ref 12.0–15.0)
MCH: 32.2 pg (ref 26.0–34.0)
MCHC: 32.4 g/dL (ref 30.0–36.0)
MCV: 99.4 fL (ref 78.0–100.0)
PLATELETS: 231 10*3/uL (ref 150–400)
RBC: 3.38 MIL/uL — AB (ref 3.87–5.11)
RDW: 14.5 % (ref 11.5–15.5)
WBC: 8.9 10*3/uL (ref 4.0–10.5)

## 2014-05-20 LAB — BASIC METABOLIC PANEL
ANION GAP: 11 (ref 5–15)
BUN: 15 mg/dL (ref 6–23)
CALCIUM: 8.4 mg/dL (ref 8.4–10.5)
CO2: 21 mmol/L (ref 19–32)
CREATININE: 0.69 mg/dL (ref 0.50–1.10)
Chloride: 105 mmol/L (ref 96–112)
GFR calc Af Amer: >90 mL/min (ref >90)
GFR, EST NON AFRICAN AMERICAN: 89 mL/min — AB (ref >90)
Glucose, Bld: 188 mg/dL — ABNORMAL HIGH (ref 70–99)
Potassium: 4.4 mmol/L (ref 3.5–5.1)
Sodium: 137 mmol/L (ref 135–145)

## 2014-05-20 LAB — GLUCOSE, CAPILLARY
GLUCOSE-CAPILLARY: 188 mg/dL — AB (ref 70–99)
GLUCOSE-CAPILLARY: 187 mg/dL — AB (ref 70–99)
GLUCOSE-CAPILLARY: 76 mg/dL (ref 70–99)
Glucose-Capillary: 189 mg/dL — ABNORMAL HIGH (ref 70–99)

## 2014-05-20 LAB — URINE CULTURE
Colony Count: NO GROWTH
Culture: NO GROWTH
SPECIAL REQUESTS: NORMAL

## 2014-05-20 MED ORDER — PNEUMOCOCCAL VAC POLYVALENT 25 MCG/0.5ML IJ INJ
0.5000 mL | INJECTION | INTRAMUSCULAR | Status: AC
  Administered 2014-05-21: 0.5 mL via INTRAMUSCULAR
  Filled 2014-05-20: qty 0.5

## 2014-05-20 NOTE — Discharge Instructions (Signed)

## 2014-05-20 NOTE — Progress Notes (Signed)
Subjective: 1 Day Post-Op Procedure(s) (LRB): LEFT TOTAL KNEE ARTHROPLASTY (Left) Patient reports pain as 5 on 0-10 scale.    Objective: Vital signs in last 24 hours: Temp:  [97 F (36.1 C)-97.9 F (36.6 C)] 97.6 F (36.4 C) (02/16 0507) Pulse Rate:  [54-76] 61 (02/16 0507) Resp:  [11-22] 17 (02/16 0507) BP: (89-139)/(37-76) 93/55 mmHg (02/16 0507) SpO2:  [25 %-100 %] 100 % (02/16 0507)  Intake/Output from previous day: 02/15 0701 - 02/16 0700 In: 2050 [P.O.:150; I.V.:1900] Out: 2270 [Urine:1850; Drains:370; Blood:50] Intake/Output this shift:     Recent Labs  05/20/14 0637  HGB 10.9*    Recent Labs  05/20/14 0637  WBC 8.9  RBC 3.38*  HCT 33.6*  PLT 231    Recent Labs  05/20/14 0637  NA 137  K 4.4  CL 105  CO2 21  BUN 15  CREATININE 0.69  GLUCOSE 188*  CALCIUM 8.4   No results for input(s): LABPT, INR in the last 72 hours.  ABD soft Neurovascular intact Sensation intact distally Intact pulses distally Dorsiflexion/Plantar flexion intact Incision: dressing C/D/I  Assessment/Plan: 1 Day Post-Op Procedure(s) (LRB): LEFT TOTAL KNEE ARTHROPLASTY (Left) Advance diet Up with therapy  Baley Shands J 05/20/2014, 8:11 AM

## 2014-05-20 NOTE — Progress Notes (Signed)
Per Daughter, nancy "patient gets UTI's all the time"

## 2014-05-20 NOTE — Progress Notes (Signed)
Physical Therapy Treatment Patient Details Name: Amber Ashley MRN: [id number] DOB: October 10, 1945 Today's Date: 05/20/2014    History of Present Illness Pt s/p L TKA (05/19/14)    PT Comments    Pt making good progress with ambulation, but with very slow cadence.  Husband may be putting up rails today on stairs.  Will need to follow up with patient tomorrow to see if he did that or if she needs training on stairs with AD.  Follow Up Recommendations  Home health PT     Equipment Recommendations  None recommended by PT    Recommendations for Other Services       Precautions / Restrictions Precautions Precautions: Knee Precaution Booklet Issued: Yes (comment) Required Braces or Orthoses: Knee Immobilizer - Left Knee Immobilizer - Left: On at all times Restrictions Weight Bearing Restrictions: Yes LLE Weight Bearing: Weight bearing as tolerated    Mobility  Bed Mobility Overal bed mobility: Needs Assistance Bed Mobility: Supine to Sit     Supine to sit: Min assist Sit to supine: Min assist   General bed mobility comments: Pt able to use R LE to A L LE, needed A to get trunk up with HOB flat.  Transfers Overall transfer level: Needs assistance Equipment used: Rolling walker (2 wheeled) Transfers: Sit to/from Stand Sit to Stand: Min guard         General transfer comment: stood from bed and from [facility] over toilet  Ambulation/Gait Ambulation/Gait assistance: Min guard Ambulation Distance (Feet): 140 Feet Assistive device: Rolling walker (2 wheeled) Gait Pattern/deviations: Step-to pattern;Decreased stance time - left Gait velocity: decreased   General Gait Details: Pt ambulating very slowly with step to pattern.  Cues for RW placement and did better as gait progressed.   Stairs            Wheelchair Mobility    Modified Rankin (Stroke Patients Only)       Balance Overall balance assessment: Needs assistance Sitting-balance support: Feet  supported Sitting balance-Leahy Scale: Good     Standing balance support: Bilateral upper extremity supported;During functional activity Standing balance-Leahy Scale: Fair Standing balance comment: Able to take B UE off of RW during toileting/clothing management                    Cognition Arousal/Alertness: Awake/alert Behavior During Therapy: WFL for tasks assessed/performed Overall Cognitive Status: Within Functional Limits for tasks assessed                      Exercises Total Joint Exercises Ankle Circles/Pumps: AROM;Both;10 reps;Supine Quad Sets: Both;10 reps;Supine Short Arc Quad: AAROM;Left;5 reps;Supine Heel Slides: AROM;Left;10 reps;Supine Straight Leg Raises: AAROM;Left;10 reps;Supine Goniometric ROM: -10- 75 degrees AROM L knee    General Comments        Pertinent Vitals/Pain Pain Assessment: 0-10 Pain Score: 5  Pain Location: L knee Pain Descriptors / Indicators: Aching;Throbbing Pain Intervention(s): Limited activity within patient's tolerance;Monitored during session    Home Living Family/patient expects to be discharged to:: Private residence Living Arrangements: Spouse/significant other Available Help at Discharge: Family Type of Home: House Home Access: Stairs to enter Entrance Stairs-Rails: None (supposed to be putting up rail today) Home Layout: One level Home Equipment: Walker - 2 wheels;Bedside commode      Prior Function Level of Independence: Independent      Comments: could do everything, but slowly   PT Goals (current goals can now be found in the care plan section) Acute Rehab  PT Goals Patient Stated Goal: go home ASAP PT Goal Formulation: With patient Time For Goal Achievement: 05/27/14 Potential to Achieve Goals: Good Progress towards PT goals: Progressing toward goals    Frequency  7X/week    PT Plan Current plan remains appropriate    Co-evaluation             End of Session Equipment Utilized  During Treatment: Gait belt;Left knee immobilizer Activity Tolerance: Patient tolerated treatment well Patient left: in chair;with call bell/phone within reach;with family/visitor present     Time: 6578-46961214-1257 PT Time Calculation (min) (ACUTE ONLY): 43 min  Charges:  $Gait Training: 23-37 mins $Therapeutic Exercise: 8-22 mins                    G Codes:      Ishika Chesterfield LUBECK 05/20/2014, 1:43 PM

## 2014-05-20 NOTE — Evaluation (Signed)
Physical Therapy Evaluation Patient Details Name: Amber Ashley MRN: [id number] DOB: [birth year] Today's Date: 05/20/2014   History of Present Illness  Pt s/p L TKA (05/19/14)  Clinical Impression  Pt admitted with above diagnosis and is POD #1. Pt currently with functional limitations due to the deficits listed below (see PT Problem List). Pt will benefit from skilled PT to increase their independence and safety with mobility to allow discharge to the venue listed below.  Pt moving well despite 8/10 pain level and anticipate continued progress once pain level is down.  Recommend HHPT and pt already has DME.    Follow Up Recommendations Home health PT    Equipment Recommendations  None recommended by PT    Recommendations for Other Services       Precautions / Restrictions Precautions Precautions: Knee Required Braces or Orthoses: Knee Immobilizer - Left Knee Immobilizer - Left: On at all times Restrictions Weight Bearing Restrictions: Yes LLE Weight Bearing: Weight bearing as tolerated      Mobility  Bed Mobility Overal bed mobility: Needs Assistance Bed Mobility: Sit to Supine       Sit to supine: Min assist   General bed mobility comments: Pt up in recliner upon arrival.  MIN A for L LE back in bed.  Transfers Overall transfer level: Needs assistance   Transfers: Sit to/from Stand Sit to Stand: Min guard            Ambulation/Gait Ambulation/Gait assistance: Min guard Ambulation Distance (Feet): 25 Feet Assistive device: Rolling walker (2 wheeled) Gait Pattern/deviations: Step-to pattern;Decreased stance time - left;Antalgic     General Gait Details: Pt ambulated in room only due to 8/10 pain in L Knee.  Despite pain level, pt ambulated well but needed cues to stay within RW and not place RW so far forward.  Stairs            Wheelchair Mobility    Modified Rankin (Stroke Patients Only)       Balance Overall balance assessment: Needs  assistance Sitting-balance support: Feet supported Sitting balance-Leahy Scale: Good     Standing balance support: During functional activity Standing balance-Leahy Scale: Fair Standing balance comment: Pt able to A with clothinig management during toileting                             Pertinent Vitals/Pain Pain Assessment: 0-10 Pain Score: 8  Pain Location: L knee Pain Descriptors / Indicators: Throbbing Pain Intervention(s): Patient requesting pain meds-RN notified    Home Living Family/patient expects to be discharged to:: Private residence Living Arrangements: Spouse/significant other Available Help at Discharge: Family Type of Home: House Home Access: Stairs to enter Entrance Stairs-Rails: None (supposed to be putting up rail today) Entrance Stairs-Number of Steps: 3 Home Layout: One level Home Equipment: Walker - 2 wheels;Bedside commode      Prior Function Level of Independence: Independent         Comments: could do everything, but slowly     Hand Dominance        Extremity/Trunk Assessment               Lower Extremity Assessment: LLE deficits/detail   LLE Deficits / Details: -10 to 65 degrees L Knee AROM  Cervical / Trunk Assessment: Normal  Communication   Communication: No difficulties  Cognition Arousal/Alertness: Awake/alert Behavior During Therapy: WFL for tasks assessed/performed Overall Cognitive Status: Within Functional Limits for tasks assessed  General Comments      Exercises Total Joint Exercises Goniometric ROM: -10 to 65 AROM L knee      Assessment/Plan    PT Assessment Patient needs continued PT services  PT Diagnosis Difficulty walking;Acute pain;Generalized weakness   PT Problem List Decreased strength;Decreased range of motion;Decreased activity tolerance;Decreased balance;Decreased mobility;Decreased knowledge of use of DME;Pain  PT Treatment Interventions Gait  training;Stair training;Functional mobility training;Therapeutic activities;Therapeutic exercise;DME instruction;Balance training   PT Goals (Current goals can be found in the Care Plan section) Acute Rehab PT Goals Patient Stated Goal: go home ASAP PT Goal Formulation: With patient Time For Goal Achievement: 05/27/14 Potential to Achieve Goals: Good    Frequency 7X/week   Barriers to discharge        Co-evaluation               End of Session Equipment Utilized During Treatment: Gait belt;Left knee immobilizer Activity Tolerance: Patient tolerated treatment well Patient left: in bed;with nursing/sitter in room;with call bell/phone within reach Nurse Communication: Patient requests pain meds;Mobility status         Time: [id number] PT Time Calculation (min) (ACUTE ONLY): 22 min   Charges:   PT Evaluation $Initial PT Evaluation Tier I: 1 Procedure     PT G Codes:      Dorothie Wah L. Katrinka BlazingSmith, [place] Pager [phone] 05/20/2014   Amber Ashley LUBECK 05/20/2014, 11:21 AM

## 2014-05-21 DIAGNOSIS — M1712 Unilateral primary osteoarthritis, left knee: Secondary | ICD-10-CM | POA: Diagnosis not present

## 2014-05-21 LAB — CBC
HCT: 30.6 % — ABNORMAL LOW (ref 36.0–46.0)
Hemoglobin: 10.1 g/dL — ABNORMAL LOW (ref 12.0–15.0)
MCH: 32.6 pg (ref 26.0–34.0)
MCHC: 33 g/dL (ref 30.0–36.0)
MCV: 98.7 fL (ref 78.0–100.0)
PLATELETS: 239 10*3/uL (ref 150–400)
RBC: 3.1 MIL/uL — ABNORMAL LOW (ref 3.87–5.11)
RDW: 14.7 % (ref 11.5–15.5)
WBC: 9 10*3/uL (ref 4.0–10.5)

## 2014-05-21 LAB — GLUCOSE, CAPILLARY
GLUCOSE-CAPILLARY: 192 mg/dL — AB (ref 70–99)
Glucose-Capillary: 164 mg/dL — ABNORMAL HIGH (ref 70–99)

## 2014-05-21 LAB — BASIC METABOLIC PANEL
Anion gap: 4 — ABNORMAL LOW (ref 5–15)
BUN: 23 mg/dL (ref 6–23)
CO2: 30 mmol/L (ref 19–32)
Calcium: 8.5 mg/dL (ref 8.4–10.5)
Chloride: 102 mmol/L (ref 96–112)
Creatinine, Ser: 0.93 mg/dL (ref 0.50–1.10)
GFR, EST AFRICAN AMERICAN: 73 mL/min — AB (ref >90)
GFR, EST NON AFRICAN AMERICAN: 63 mL/min — AB (ref >90)
Glucose, Bld: 178 mg/dL — ABNORMAL HIGH (ref 70–99)
Potassium: 4.4 mmol/L (ref 3.5–5.1)
Sodium: 136 mmol/L (ref 135–145)

## 2014-05-21 MED ORDER — OXYCODONE HCL 5 MG PO TABS: ORAL_TABLET | ORAL | Status: DC

## 2014-05-21 MED ORDER — DOCUSATE SODIUM 100 MG PO CAPS: ORAL_CAPSULE | ORAL | Status: DC

## 2014-05-21 MED ORDER — OXYCODONE HCL ER 20 MG PO T12A: 20.0000 mg | EXTENDED_RELEASE_TABLET | Freq: Two times a day (BID) | ORAL | Status: DC

## 2014-05-21 MED ORDER — POLYETHYLENE GLYCOL 3350 17 G PO PACK: 17.0000 g | PACK | Freq: Two times a day (BID) | ORAL | Status: DC

## 2014-05-21 MED ORDER — CELECOXIB 200 MG PO CAPS: ORAL_CAPSULE | ORAL | Status: DC

## 2014-05-21 MED ORDER — APIXABAN 2.5 MG PO TABS: ORAL_TABLET | ORAL | Status: DC

## 2014-05-21 NOTE — Progress Notes (Signed)
Physical Therapy Treatment Patient Details Name: Amber Ashley MRN: [id number] DOB: [birth year] Today's Date: 05/21/2014    History of Present Illness Pt s/p L TKA (05/19/14)    PT Comments    Pt very motivated to progress her therapy. Reviewed stair management technique with pt and husband, they were able to perform teachback technique. Safe from mobility standpoint to D/c home today.   Follow Up Recommendations  Home health PT     Equipment Recommendations  None recommended by PT    Recommendations for Other Services       Precautions / Restrictions Precautions Precautions: Knee Precaution Comments: reviewed no pillow under knee  Knee Immobilizer - Left: Other (comment) (pt ambulating without KI; no buckling of knee) Restrictions Weight Bearing Restrictions: Yes LLE Weight Bearing: Weight bearing as tolerated    Mobility  Bed Mobility               General bed mobility comments: up in chair   Transfers Overall transfer level: Needs assistance Equipment used: Rolling walker (2 wheeled) Transfers: Sit to/from Stand Sit to Stand: Supervision         General transfer comment: min cues to reach back and find chair prior to sitting  Ambulation/Gait Ambulation/Gait assistance: Supervision Ambulation Distance (Feet): 400 Feet Assistive device: Rolling walker (2 wheeled) Gait Pattern/deviations: Step-through pattern;Decreased stride length Gait velocity: decreased Gait velocity interpretation: Below normal speed for age/gender General Gait Details: pt demo good step through technique; no LOB noted    Stairs Stairs: Yes Stairs assistance: Min guard Stair Management: No rails;Step to pattern;Backwards;With walker Number of Stairs: 2 General stair comments: husband present and (A) with stair maangement; educated on technique   Wheelchair Mobility    Modified Rankin (Stroke Patients Only)       Balance Overall balance assessment: No apparent balance  deficits (not formally assessed)             Standing balance comment: performing ADLs in bathroom independently                     Cognition Arousal/Alertness: Awake/alert Behavior During Therapy: WFL for tasks assessed/performed Overall Cognitive Status: Within Functional Limits for tasks assessed                      Exercises Total Joint Exercises Quad Sets: Both;10 reps;Seated Short Arc Quad: AROM;Left;10 reps;Seated Hip ABduction/ADduction: AROM;Left;10 reps;Seated Long Arc Quad: AROM;Left;10 reps;Seated Goniometric ROM: 5 to 90     General Comments        Pertinent Vitals/Pain Pain Assessment: 0-10 Pain Score: 4  Pain Location: Lt knee Pain Descriptors / Indicators: Burning Pain Intervention(s): Monitored during session;Premedicated before session;Repositioned    Home Living                      Prior Function            PT Goals (current goals can now be found in the care plan section) Acute Rehab PT Goals Patient Stated Goal: to go home today PT Goal Formulation: With patient Time For Goal Achievement: 05/27/14 Potential to Achieve Goals: Good Progress towards PT goals: Progressing toward goals    Frequency  7X/week    PT Plan Current plan remains appropriate    Co-evaluation             End of Session   Activity Tolerance: Patient tolerated treatment well Patient left: in chair;with call bell/phone within reach;with  family/visitor present     Time: [id number] PT Time Calculation (min) (ACUTE ONLY): 24 min  Charges:  $Gait Training: 8-22 mins $Therapeutic Exercise: 8-22 mins                    G Codes:      Donnamarie PoagWest, Andrya Roppolo [place], [place]  [phone] 05/21/2014, 8:41 AM

## 2014-05-21 NOTE — Progress Notes (Signed)
05/21/14 [facility]. Spoke with patient, no change in d/c plan.Patient states that she will have family available to assist after d/c.Spoke with Kipp BroodBrent at [employer], they will be providing CPM, rolling walker and 3N1.

## 2014-05-21 NOTE — Discharge Summary (Signed)
Patient ID: Amber Ashley MRN: [id number] DOB/AGE: 67/10/1945 67 y.o.  Admit date: 05/19/2014 Discharge date: 05/21/2014  Admission Diagnoses:  Principal Problem:   Primary localized osteoarthritis of left knee Active Problems:   Hypertension   Diabetes mellitus without complication   Diverticulitis   Arthritis   Lumbar pain with radiation down right leg   DJD (degenerative joint disease) of knee   Discharge Diagnoses:  Same  Past Medical History  Diagnosis Date  . Hypertension   . Diabetes mellitus without complication   . Diverticulitis   . Restless leg syndrome   . Lumbar pain with radiation down right leg   . Arthritis     degenerative lumbar spine, back injection with Dr. Sherene SiresWainer's office    . Primary localized osteoarthritis of left knee   . Complication of anesthesia     woke up very slowly, states she was told that " we had a time waking me you up"    Surgeries: Procedure(s): LEFT TOTAL KNEE ARTHROPLASTY on 05/19/2014   Consultants:    Discharged Condition: Improved  Hospital Course: Amber Ashley is an 69 y.o. female who was admitted 05/19/2014 for operative treatment ofPrimary localized osteoarthritis of left knee. Patient has severe unremitting pain that affects sleep, daily activities, and work/hobbies. After pre-op clearance the patient was taken to the operating room on 05/19/2014 and underwent  Procedure(s): LEFT TOTAL KNEE ARTHROPLASTY.    Patient was given perioperative antibiotics: Anti-infectives    Start     Dose/Rate Route Frequency Ordered Stop   05/19/14 1800  vancomycin (VANCOCIN) IVPB 1000 mg/200 mL premix     1,000 mg 200 mL/hr over 60 Minutes Intravenous Every 12 hours 05/19/14 1720 05/20/14 0559   05/19/14 1051  cefUROXime (ZINACEF) injection  Status:  Discontinued       As needed 05/19/14 1052 05/19/14 1143   05/19/14 0649  vancomycin (VANCOCIN) 1,500 mg in sodium chloride 0.9 % 500 mL IVPB  Status:  Discontinued     1,500 mg 250  mL/hr over 120 Minutes Intravenous On call to O.R. 05/19/14 [id number] 05/19/14 0652   05/19/14 0600  vancomycin (VANCOCIN) 1,500 mg in sodium chloride 0.9 % 500 mL IVPB     1,500 mg 250 mL/hr over 120 Minutes Intravenous On call to O.R. 05/18/14 1401 05/19/14 1000       Patient was given sequential compression devices, early ambulation, and chemoprophylaxis to prevent DVT.  Patient benefited maximally from hospital stay and there were no complications.    Recent vital signs: Patient Vitals for the past 24 hrs:  BP Temp Temp src Pulse Resp SpO2  05/21/14 0421 (!) 114/47 mmHg 98.1 F (36.7 C) - (!) 54 18 97 %  05/20/14 2051 (!) 108/55 mmHg 97.9 F (36.6 C) - 63 18 99 %  05/20/14 2000 - - - - 18 99 %  05/20/14 1443 (!) 110/52 mmHg 98 F (36.7 C) Oral 64 18 98 %  05/20/14 0800 - - - - 17 100 %     Recent laboratory studies:  Recent Labs  05/20/14 0637 05/21/14 0605  WBC 8.9 9.0  HGB 10.9* 10.1*  HCT 33.6* 30.6*  PLT 231 239  NA 137  --   K 4.4  --   CL 105  --   CO2 21  --   BUN 15  --   CREATININE 0.69  --   GLUCOSE 188*  --   CALCIUM 8.4  --      Discharge Medications:  Medication List    STOP taking these medications        aspirin EC 81 MG tablet     nabumetone 750 MG tablet  Commonly known as:  RELAFEN     oxyCODONE-acetaminophen 7.5-325 MG per tablet  Commonly known as:  PERCOCET      TAKE these medications        acetaminophen 325 MG tablet  Commonly known as:  TYLENOL  Take 650 mg by mouth every 6 (six) hours as needed for mild pain, fever or headache.     allopurinol 100 MG tablet  Commonly known as:  ZYLOPRIM  Take 100 mg by mouth 2 (two) times daily.     apixaban 2.5 MG Tabs tablet  Commonly known as:  ELIQUIS  1 tablet po bid to prevent blood clots     celecoxib 200 MG capsule  Commonly known as:  CELEBREX  1 tab po q day with food for pain and  swelling     docusate sodium 100 MG capsule  Commonly known as:  COLACE  1 tab 2 times a  day while on narcotics.  STOOL SOFTENER     glimepiride 1 MG tablet  Commonly known as:  AMARYL  Take 1 mg by mouth daily with breakfast.     lisinopril-hydrochlorothiazide 20-25 MG per tablet  Commonly known as:  PRINZIDE,ZESTORETIC  Take 1 tablet by mouth daily.     metFORMIN 500 MG tablet  Commonly known as:  GLUCOPHAGE  Take 500 mg by mouth 2 (two) times daily with a meal.     oxyCODONE 5 MG immediate release tablet  Commonly known as:  Oxy IR/ROXICODONE  1-2 tablets every 4-6 hrs as needed for pain     OxyCODONE 20 mg T12a 12 hr tablet  Commonly known as:  OXYCONTIN  Take 1 tablet (20 mg total) by mouth every 12 (twelve) hours.     polyethylene glycol packet  Commonly known as:  MIRALAX / GLYCOLAX  Take 17 g by mouth 2 (two) times daily.     pregabalin 150 MG capsule  Commonly known as:  LYRICA  Take 150 mg by mouth 3 (three) times daily.     rOPINIRole 2 MG tablet  Commonly known as:  REQUIP  Take 2 mg by mouth at bedtime.     Vitamin D 2000 UNITS Caps  Take 2,000 Units by mouth daily.        Diagnostic Studies: No results found.  Disposition: 01-Home or Self Care      Discharge Instructions    CPM    Complete by:  As directed   Continuous passive motion machine (CPM):      Use the CPM from 0 to 90 for 6 hours per day.       You may break it up into 2 or 3 sessions per day.      Use CPM for 2 weeks or until you are told to stop.     Call MD / Call 911    Complete by:  As directed   If you experience chest pain or shortness of breath, CALL 911 and be transported to the hospital emergency room.  If you develope a fever above 101 F, pus (white drainage) or increased drainage or redness at the wound, or calf pain, call your surgeon's office.     Change dressing    Complete by:  As directed   DO NOT REMOVE BANDAGE OVER SURGICAL INCISION.  [facility] WHOLE  LEG INCLUDING OVER THE WATERPROOF BANDAGE WITH SOAP AND WATER EVERY DAY.     Constipation Prevention     Complete by:  As directed   Drink plenty of fluids.  Prune juice may be helpful.  You may use a stool softener, such as Colace (over the counter) 100 mg twice a day.  Use MiraLax (over the counter) for constipation as needed.     Diet - low sodium heart healthy    Complete by:  As directed      Discharge instructions    Complete by:  As directed   Total Knee Replacement Care After Refer to this sheet in the next few weeks. These discharge instructions provide you with general information on caring for yourself after you leave the hospital. Your caregiver may also give you specific instructions. Your treatment has been planned according to the most current medical practices available, but unavoidable complications sometimes occur. If you have any problems or questions after discharge, please call your caregiver. Regaining a near full range of motion of your knee within the first 3 to 6 weeks after surgery is critical. HOME CARE INSTRUCTIONS  You may resume a normal diet and activities as directed.  Perform exercises as directed.  Place gray foam block, curve side up under heel at all times except when in CPM or when walking.  DO NOT modify, tear, cut, or change in any way the gray foam block. You will receive physical therapy daily  Take showers instead of baths until informed otherwise.  You may shower on Friday.  Please wash whole leg including wound with soap and water over aquacel dressing.  DO NOT REMOVE AQUACEL DRESSING.  DR Thurston Hole WILL TAKE IT OFF IN THE OFFICE AT THE POST OP APPOINTMENT  It is OK to take over-the-counter tylenol in addition to the oxycodone for pain, discomfort, or fever. Oxycodone is VERY constipating.  Please take stool softener twice a day and laxatives daily until bowels are regular Eat a well-balanced diet.  Avoid lifting or driving until you are instructed otherwise.  Make an appointment to see your caregiver for stitches (suture) or staple removal as directed.  If  you have been sent home with a continuous passive motion machine (CPM machine), 0-90 degrees 6 hrs a day   2 hrs a shift SEEK MEDICAL CARE IF: You have swelling of your calf or leg.  You develop shortness of breath or chest pain.  You have redness, swelling, or increasing pain in the wound.  There is pus or any unusual drainage coming from the surgical site.  You notice a bad smell coming from the surgical site or dressing.  The surgical site breaks open after sutures or staples have been removed.  There is persistent bleeding from the suture or staple line.  You are getting worse or are not improving.  You have any other questions or concerns.  SEEK IMMEDIATE MEDICAL CARE IF:  You have a fever.  You develop a rash.  You have difficulty breathing.  You develop any reaction or side effects to medicines given.  Your knee motion is decreasing rather than improving.  MAKE SURE YOU:  Understand these instructions.  Will watch your condition.  Will get help right away if you are not doing well or get worse.     Do not put a pillow under the knee. Place it under the heel.    Complete by:  As directed   Place gray foam block, curve side up  under heel at all times except when in CPM or when walking.  DO NOT modify, tear, cut, or change in any way the gray foam block.     Increase activity slowly as tolerated    Complete by:  As directed      TED hose    Complete by:  As directed   Use stockings (TED hose) for 2 weeks on both leg(s).  You may remove them at night for sleeping.           Follow-up Information    Follow up with Nilda Simmer, MD On 06/02/2014.   Specialty:  Orthopedic Surgery   Why:  APPT TIME 3:30 PM   Contact information:   [address] [place] [address] [phone]        Signed: Pascal Lux 05/21/2014, 7:30 AM

## 2014-05-21 NOTE — Evaluation (Signed)
Occupational Therapy Evaluation and Discharge Patient Details Name: Amber Ashley MRN: [id number] DOB: 09/12/1945 Today's Date: 05/21/2014    History of Present Illness Pt s/p L TKA (05/19/14)   Clinical Impression   PTA pt lived at home and was independent with ADLs. Pt currently limited by decreased ROM in LLE and pain and requires assist for LB ADLs. Pt will have assistance from family and is at Supervision level with functional mobility. All education and training completed for compensatory techniques, fall prevention, and energy conservation. No further acute OT needs. Pt safe for d/c from OT standpoint.     Follow Up Recommendations  No OT follow up;Supervision/Assistance - 24 hour    Equipment Recommendations  None recommended by OT    Recommendations for Other Services       Precautions / Restrictions Precautions Precautions: Knee Precaution Comments: reviewed no pillow under knee  Knee Immobilizer - Left: Other (comment) (pt ambulating without KI; no buckling of knee) Restrictions Weight Bearing Restrictions: Yes LLE Weight Bearing: Weight bearing as tolerated      Mobility Bed Mobility               General bed mobility comments: up in chair   Transfers Overall transfer level: Needs assistance Equipment used: Rolling walker (2 wheeled) Transfers: Sit to/from Stand Sit to Stand: Supervision         General transfer comment: Good technique    Balance Overall balance assessment: No apparent balance deficits (not formally assessed)             Standing balance comment: performing ADLs in bathroom independently                             ADL Overall ADL's : Needs assistance/impaired Eating/Feeding: Independent;Sitting   Grooming: Supervision/safety;Standing   Upper Body Bathing: Set up;Sitting   Lower Body Bathing: Minimal assistance;Sit to/from stand   Upper Body Dressing : Set up;Sitting   Lower Body Dressing: Moderate  assistance;Sit to/from stand   Toilet Transfer: Supervision/safety;Ambulation;RW Toilet Transfer Details (indicate cue type and reason): sit<>stand from recliner Toileting- Clothing Manipulation and Hygiene: Supervision/safety;Sit to/from Nurse, children'sstand     Tub/Shower Transfer Details (indicate cue type and reason): educated pt on safe tub transfer with use of RW and 3N1. Pt verbalized technique in return.  Functional mobility during ADLs: Supervision/safety;Rolling walker General ADL Comments: Pt will have 24/7 assistance from family and they will help with socks and shoes. Educated pt on compensatory techniques for LB ADLs, fall prevention, and energy conservation.      Vision  Pt reports no change from baseline.           Pertinent Vitals/Pain Pain Assessment: 0-10 Pain Score: 4  Pain Location: Lt knee Pain Descriptors / Indicators: Burning Pain Intervention(s): Monitored during session;Premedicated before session;Repositioned     Hand Dominance     Extremity/Trunk Assessment Upper Extremity Assessment Upper Extremity Assessment: Overall WFL for tasks assessed   Lower Extremity Assessment Lower Extremity Assessment: Defer to PT evaluation   Cervical / Trunk Assessment Cervical / Trunk Assessment: Normal   Communication Communication Communication: No difficulties   Cognition Arousal/Alertness: Awake/alert Behavior During Therapy: WFL for tasks assessed/performed Overall Cognitive Status: Within Functional Limits for tasks assessed                                Home Living Family/patient expects to  be discharged to:: Private residence Living Arrangements: Spouse/significant other Available Help at Discharge: Family Type of Home: House Home Access: Stairs to enter Secretary/administrator of Steps: 3 Entrance Stairs-Rails: None Home Layout: One level     Bathroom Shower/Tub: Tub/shower unit Shower/tub characteristics: Curtain Firefighter: Standard      Home Equipment: Environmental consultant - 2 wheels;Bedside commode          Prior Functioning/Environment Level of Independence: Independent        Comments: could do everything, but slowly    OT Diagnosis: Generalized weakness;Acute pain         OT Goals(Current goals can be found in the care plan section) Acute Rehab OT Goals Patient Stated Goal: to go home today   End of Session Equipment Utilized During Treatment: Rolling walker CPM Left Knee CPM Left Knee: Off  Activity Tolerance: Patient tolerated treatment well Patient left: in chair;with call bell/phone within reach;with family/visitor present   Time: 4098-1191 OT Time Calculation (min): 12 min Charges:  OT General Charges $OT Visit: 1 Procedure OT Evaluation $Initial OT Evaluation Tier I: 1 Procedure G-Codes:    Rae Lips May 26, 2014, 8:56 AM  Carney Living, OTR/L Occupational Therapist [phone] (pager)

## 2014-06-27 ENCOUNTER — Other Ambulatory Visit: Payer: Self-pay | Admitting: Orthopedic Surgery

## 2014-06-27 DIAGNOSIS — G8929 Other chronic pain: Secondary | ICD-10-CM

## 2014-06-27 DIAGNOSIS — M545 Low back pain, unspecified: Secondary | ICD-10-CM

## 2014-06-30 ENCOUNTER — Ambulatory Visit
Admission: RE | Admit: 2014-06-30 | Discharge: 2014-06-30 | Disposition: A | Discharge status: Home / Self Care | Payer: Medicare Other | Source: Ambulatory Visit | Attending: Orthopedic Surgery | Admitting: Orthopedic Surgery

## 2014-06-30 DIAGNOSIS — M545 Low back pain, unspecified: Secondary | ICD-10-CM

## 2014-06-30 DIAGNOSIS — G8929 Other chronic pain: Secondary | ICD-10-CM

## 2014-06-30 MED ORDER — IOHEXOL 180 MG/ML  SOLN
1.0000 mL | Freq: Once | INTRAMUSCULAR | Status: AC | PRN
  Administered 2014-06-30: 1 mL via EPIDURAL

## 2014-06-30 MED ORDER — METHYLPREDNISOLONE ACETATE 40 MG/ML INJ SUSP (RADIOLOG
120.0000 mg | Freq: Once | INTRAMUSCULAR | Status: AC
  Administered 2014-06-30: 120 mg via EPIDURAL

## 2014-06-30 NOTE — Discharge Instructions (Signed)

## 2014-09-29 ENCOUNTER — Other Ambulatory Visit: Payer: Self-pay

## 2014-12-17 ENCOUNTER — Other Ambulatory Visit: Payer: Self-pay | Admitting: Physical Medicine and Rehabilitation

## 2014-12-17 DIAGNOSIS — M48061 Spinal stenosis, lumbar region without neurogenic claudication: Secondary | ICD-10-CM

## 2014-12-23 ENCOUNTER — Ambulatory Visit
Admission: RE | Admit: 2014-12-23 | Discharge: 2014-12-23 | Disposition: A | Discharge status: Home / Self Care | Payer: Medicare Other | Source: Ambulatory Visit | Attending: Physical Medicine and Rehabilitation | Admitting: Physical Medicine and Rehabilitation

## 2014-12-23 VITALS — BP 162/86 | HR 63

## 2014-12-23 DIAGNOSIS — M545 Low back pain, unspecified: Secondary | ICD-10-CM

## 2014-12-23 DIAGNOSIS — M48061 Spinal stenosis, lumbar region without neurogenic claudication: Secondary | ICD-10-CM

## 2014-12-23 MED ORDER — IOHEXOL 180 MG/ML  SOLN
1.0000 mL | Freq: Once | INTRAMUSCULAR | Status: DC | PRN
  Administered 2014-12-23: 1 mL via EPIDURAL

## 2014-12-23 MED ORDER — METHYLPREDNISOLONE ACETATE 40 MG/ML INJ SUSP (RADIOLOG
120.0000 mg | Freq: Once | INTRAMUSCULAR | Status: AC
  Administered 2014-12-23: 120 mg via EPIDURAL

## 2014-12-23 NOTE — Discharge Instructions (Signed)

## 2015-02-13 ENCOUNTER — Other Ambulatory Visit (HOSPITAL_COMMUNITY): Payer: Self-pay | Admitting: Neurological Surgery

## 2015-03-05 ENCOUNTER — Encounter (HOSPITAL_COMMUNITY)
Admission: RE | Admit: 2015-03-05 | Discharge: 2015-03-05 | Disposition: A | Discharge status: Home / Self Care | Payer: Medicare Other | Source: Ambulatory Visit | Attending: Neurological Surgery | Admitting: Neurological Surgery

## 2015-03-05 ENCOUNTER — Encounter (HOSPITAL_COMMUNITY): Payer: Self-pay

## 2015-03-05 DIAGNOSIS — Z01812 Encounter for preprocedural laboratory examination: Secondary | ICD-10-CM | POA: Insufficient documentation

## 2015-03-05 DIAGNOSIS — I10 Essential (primary) hypertension: Secondary | ICD-10-CM | POA: Diagnosis not present

## 2015-03-05 DIAGNOSIS — Z79899 Other long term (current) drug therapy: Secondary | ICD-10-CM | POA: Diagnosis not present

## 2015-03-05 DIAGNOSIS — Z01818 Encounter for other preprocedural examination: Secondary | ICD-10-CM | POA: Diagnosis not present

## 2015-03-05 DIAGNOSIS — M48061 Spinal stenosis, lumbar region without neurogenic claudication: Secondary | ICD-10-CM

## 2015-03-05 DIAGNOSIS — M4806 Spinal stenosis, lumbar region: Secondary | ICD-10-CM | POA: Diagnosis not present

## 2015-03-05 HISTORY — DX: Pain in unspecified joint: M25.50

## 2015-03-05 HISTORY — DX: Nocturia: R35.1

## 2015-03-05 HISTORY — DX: Gout, unspecified: M10.9

## 2015-03-05 HISTORY — DX: Constipation, unspecified: K59.00

## 2015-03-05 HISTORY — DX: Frequency of micturition: R35.0

## 2015-03-05 HISTORY — DX: Other chronic pain: G89.29

## 2015-03-05 HISTORY — DX: Weakness: R53.1

## 2015-03-05 HISTORY — DX: Personal history of other medical treatment: Z92.89

## 2015-03-05 HISTORY — DX: Dorsalgia, unspecified: M54.9

## 2015-03-05 LAB — SURGICAL PCR SCREEN
MRSA, PCR: NEGATIVE
STAPHYLOCOCCUS AUREUS: NEGATIVE

## 2015-03-05 LAB — BASIC METABOLIC PANEL
Anion gap: 4 — ABNORMAL LOW (ref 5–15)
BUN: 30 mg/dL — AB (ref 6–20)
CHLORIDE: 103 mmol/L (ref 101–111)
CO2: 29 mmol/L (ref 22–32)
CREATININE: 0.8 mg/dL (ref 0.44–1.00)
Calcium: 8.9 mg/dL (ref 8.9–10.3)
GFR calc Af Amer: >60 mL/min (ref >60)
GLUCOSE: 86 mg/dL (ref 65–99)
POTASSIUM: 4.4 mmol/L (ref 3.5–5.1)
Sodium: 136 mmol/L (ref 135–145)

## 2015-03-05 LAB — CBC WITH DIFFERENTIAL/PLATELET
Basophils Absolute: 0.1 10*3/uL (ref 0.0–0.1)
Basophils Relative: 1 %
EOS PCT: 2 %
Eosinophils Absolute: 0.2 10*3/uL (ref 0.0–0.7)
HEMATOCRIT: 37.4 % (ref 36.0–46.0)
Hemoglobin: 12.9 g/dL (ref 12.0–15.0)
LYMPHS ABS: 3.7 10*3/uL (ref 0.7–4.0)
Lymphocytes Relative: 47 %
MCH: 32.3 pg (ref 26.0–34.0)
MCHC: 34.5 g/dL (ref 30.0–36.0)
MCV: 93.5 fL (ref 78.0–100.0)
MONO ABS: 0.3 10*3/uL (ref 0.1–1.0)
MONOS PCT: 4 %
NEUTROS ABS: 3.7 10*3/uL (ref 1.7–7.7)
Neutrophils Relative %: 46 %
Platelets: 270 10*3/uL (ref 150–400)
RBC: 4 MIL/uL (ref 3.87–5.11)
RDW: 14.8 % (ref 11.5–15.5)
WBC: 8 10*3/uL (ref 4.0–10.5)

## 2015-03-05 LAB — PROTIME-INR
INR: 1.13 (ref 0.00–1.49)
Prothrombin Time: 14.7 seconds (ref 11.6–15.2)

## 2015-03-05 LAB — GLUCOSE, CAPILLARY: GLUCOSE-CAPILLARY: 73 mg/dL (ref 65–99)

## 2015-03-05 NOTE — Progress Notes (Addendum)
Cardiologist is Dr.Hochrein with last visit in epic from 05/2014  Sees [facility] [place] for primary medical needs  Echo denies ever having one  Stress test > 10 yrs ago  Heart cath denies ever having one  EKG in epic from 05-09-14  CXR denies having one in the past yr

## 2015-03-05 NOTE — Pre-Procedure Instructions (Signed)
Amber HurterBetty K Ashley  03/05/2015      [facility] - [place], [place] - [address] [place] [id number] Phone: [phone] Fax: [phone]    Your procedure is scheduled on Thurs, Dec 8 @ 10:45 AM  [facility] Admitting at 8:45 AM  Call this number if you have problems the morning of surgery:  [phone]   Remember:  Do not eat food or drink liquids after midnight.  Take these medicines the morning of surgery with A SIP OF WATER Allopurinol(Zyloprim),Pain Pill(if needed),and Pregabalin(Lyrica)              No Goody's,BC's,Aleve,Aspirin,Ibuprofen,Motrin,Advil,Fish Oil,or any Herbal Medications.    Do not wear jewelry, make-up or nail polish.  Do not wear lotions, powders, or perfumes.  You may wear deodorant.  Do not shave 48 hours prior to surgery.    Do not bring valuables to the hospital.  [facility] is not responsible for any belongings or valuables.  Contacts, dentures or bridgework may not be worn into surgery.  Leave your suitcase in the car.  After surgery it may be brought to your room.  For patients admitted to the hospital, discharge time will be determined by your treatment team.  Patients discharged the day of surgery will not be allowed to drive home.    Special instructions:  [place] - Preparing for Surgery  Before surgery, you can play an important role.  Because skin is not sterile, your skin needs to be as free of germs as possible.  You can reduce the number of germs on you skin by washing with CHG (chlorahexidine gluconate) soap before surgery.  CHG is an antiseptic cleaner which kills germs and bonds with the skin to continue killing germs even after washing.  Please DO NOT use if you have an allergy to CHG or antibacterial soaps.  If your skin becomes reddened/irritated stop using the CHG and inform your nurse when you arrive at Short Stay.  Do not shave (including legs and underarms)  for at least 48 hours prior to the first CHG shower.  You may shave your face.  Please follow these instructions carefully:   1.  Shower with CHG Soap the night before surgery and the                                morning of Surgery.  2.  If you choose to wash your hair, wash your hair first as usual with your       normal shampoo.  3.  After you shampoo, rinse your hair and body thoroughly to remove the                      Shampoo.  4.  Use CHG as you would any other liquid soap.  You can apply chg directly       to the skin and wash gently with scrungie or a clean washcloth.  5.  Apply the CHG Soap to your body ONLY FROM THE NECK DOWN.        Do not use on open wounds or open sores.  Avoid contact with your eyes,       ears, mouth and genitals (private parts).  Wash genitals (private parts)       with your normal soap.  6.  Wash thoroughly, paying special attention to  the area where your surgery        will be performed.  7.  Thoroughly rinse your body with warm water from the neck down.  8.  DO NOT shower/wash with your normal soap after using and rinsing off       the CHG Soap.  9.  Pat yourself dry with a clean towel.            10.  Wear clean pajamas.            11.  Place clean sheets on your bed the night of your first shower and do not        sleep with pets.  Day of Surgery  Do not apply any lotions/deoderants the morning of surgery.  Please wear clean clothes to the hospital/surgery Ashley.    Please read over the following fact sheets that you were given. Pain Booklet, Coughing and Deep Breathing, MRSA Information and Surgical Site Infection Prevention

## 2015-03-06 LAB — HEMOGLOBIN A1C
Hgb A1c MFr Bld: 7 % — ABNORMAL HIGH (ref 4.8–5.6)
Mean Plasma Glucose: 154 mg/dL

## 2015-03-11 MED ORDER — DEXAMETHASONE SODIUM PHOSPHATE 10 MG/ML IJ SOLN
10.0000 mg | INTRAMUSCULAR | Status: AC
  Administered 2015-03-12: 10 mg via INTRAVENOUS
  Filled 2015-03-11: qty 1

## 2015-03-11 MED ORDER — VANCOMYCIN HCL 10 G IV SOLR
1500.0000 mg | INTRAVENOUS | Status: AC
  Administered 2015-03-12: 1500 mg via INTRAVENOUS
  Filled 2015-03-11: qty 1500

## 2015-03-12 ENCOUNTER — Inpatient Hospital Stay (HOSPITAL_COMMUNITY): Payer: Medicare Other | Admitting: Certified Registered Nurse Anesthetist

## 2015-03-12 ENCOUNTER — Inpatient Hospital Stay (HOSPITAL_COMMUNITY): Payer: Medicare Other

## 2015-03-12 ENCOUNTER — Encounter (HOSPITAL_COMMUNITY)
Admission: RE | Disposition: A | Discharge status: Home / Self Care | Payer: Self-pay | Source: Ambulatory Visit | Attending: Neurological Surgery

## 2015-03-12 ENCOUNTER — Encounter (HOSPITAL_COMMUNITY): Payer: Self-pay | Admitting: Certified Registered Nurse Anesthetist

## 2015-03-12 ENCOUNTER — Inpatient Hospital Stay (HOSPITAL_COMMUNITY)
Admission: RE | Admit: 2015-03-12 | Discharge: 2015-03-13 | DRG: 517 | Disposition: A | Discharge status: Home / Self Care | Payer: Medicare Other | Source: Ambulatory Visit | Attending: Neurological Surgery | Admitting: Neurological Surgery

## 2015-03-12 DIAGNOSIS — I10 Essential (primary) hypertension: Secondary | ICD-10-CM | POA: Diagnosis present

## 2015-03-12 DIAGNOSIS — G8929 Other chronic pain: Secondary | ICD-10-CM | POA: Diagnosis present

## 2015-03-12 DIAGNOSIS — Z9841 Cataract extraction status, right eye: Secondary | ICD-10-CM

## 2015-03-12 DIAGNOSIS — Z7984 Long term (current) use of oral hypoglycemic drugs: Secondary | ICD-10-CM

## 2015-03-12 DIAGNOSIS — G2581 Restless legs syndrome: Secondary | ICD-10-CM | POA: Diagnosis present

## 2015-03-12 DIAGNOSIS — Z8249 Family history of ischemic heart disease and other diseases of the circulatory system: Secondary | ICD-10-CM | POA: Diagnosis not present

## 2015-03-12 DIAGNOSIS — E119 Type 2 diabetes mellitus without complications: Secondary | ICD-10-CM | POA: Diagnosis present

## 2015-03-12 DIAGNOSIS — M109 Gout, unspecified: Secondary | ICD-10-CM | POA: Diagnosis not present

## 2015-03-12 DIAGNOSIS — Z880 Allergy status to penicillin: Secondary | ICD-10-CM | POA: Diagnosis not present

## 2015-03-12 DIAGNOSIS — Z961 Presence of intraocular lens: Secondary | ICD-10-CM | POA: Diagnosis present

## 2015-03-12 DIAGNOSIS — Z9842 Cataract extraction status, left eye: Secondary | ICD-10-CM

## 2015-03-12 DIAGNOSIS — Z419 Encounter for procedure for purposes other than remedying health state, unspecified: Secondary | ICD-10-CM

## 2015-03-12 DIAGNOSIS — M4806 Spinal stenosis, lumbar region: Principal | ICD-10-CM | POA: Diagnosis present

## 2015-03-12 DIAGNOSIS — Z823 Family history of stroke: Secondary | ICD-10-CM | POA: Diagnosis not present

## 2015-03-12 DIAGNOSIS — K5900 Constipation, unspecified: Secondary | ICD-10-CM | POA: Diagnosis present

## 2015-03-12 DIAGNOSIS — Z833 Family history of diabetes mellitus: Secondary | ICD-10-CM

## 2015-03-12 DIAGNOSIS — Z9889 Other specified postprocedural states: Secondary | ICD-10-CM

## 2015-03-12 DIAGNOSIS — Z96652 Presence of left artificial knee joint: Secondary | ICD-10-CM | POA: Diagnosis present

## 2015-03-12 HISTORY — PX: LUMBAR LAMINECTOMY/DECOMPRESSION MICRODISCECTOMY: SHX5026

## 2015-03-12 HISTORY — DX: Other specified postprocedural states: Z98.890

## 2015-03-12 LAB — GLUCOSE, CAPILLARY
GLUCOSE-CAPILLARY: 97 mg/dL (ref 65–99)
Glucose-Capillary: 210 mg/dL — ABNORMAL HIGH (ref 65–99)
Glucose-Capillary: 204 mg/dL — ABNORMAL HIGH (ref 65–99)

## 2015-03-12 SURGERY — LUMBAR LAMINECTOMY/DECOMPRESSION MICRODISCECTOMY 2 LEVELS
Anesthesia: General | Site: Spine Lumbar | Laterality: Bilateral

## 2015-03-12 MED ORDER — ROCURONIUM BROMIDE 100 MG/10ML IV SOLN
INTRAVENOUS | Status: DC | PRN
  Administered 2015-03-12: 50 mg via INTRAVENOUS

## 2015-03-12 MED ORDER — ROPINIROLE HCL 1 MG PO TABS
2.0000 mg | ORAL_TABLET | Freq: Every day | ORAL | Status: DC
  Administered 2015-03-12: 2 mg via ORAL
  Filled 2015-03-12: qty 2

## 2015-03-12 MED ORDER — PHENYLEPHRINE HCL 10 MG/ML IJ SOLN
INTRAMUSCULAR | Status: DC | PRN
  Administered 2015-03-12: 80 ug via INTRAVENOUS
  Administered 2015-03-12: 40 ug via INTRAVENOUS
  Administered 2015-03-12: 80 ug via INTRAVENOUS

## 2015-03-12 MED ORDER — GLIMEPIRIDE 1 MG PO TABS
1.0000 mg | ORAL_TABLET | Freq: Every day | ORAL | Status: DC
  Administered 2015-03-13: 1 mg via ORAL
  Filled 2015-03-12 (×2): qty 1

## 2015-03-12 MED ORDER — SODIUM CHLORIDE 0.9 % IJ SOLN
3.0000 mL | Freq: Two times a day (BID) | INTRAMUSCULAR | Status: DC
  Administered 2015-03-12: 3 mL via INTRAVENOUS

## 2015-03-12 MED ORDER — PROPOFOL 10 MG/ML IV BOLUS
INTRAVENOUS | Status: AC
  Filled 2015-03-12: qty 20

## 2015-03-12 MED ORDER — HEMOSTATIC AGENTS (NO CHARGE) OPTIME
TOPICAL | Status: DC | PRN
  Administered 2015-03-12: 1 via TOPICAL

## 2015-03-12 MED ORDER — METHOCARBAMOL 500 MG PO TABS
500.0000 mg | ORAL_TABLET | Freq: Four times a day (QID) | ORAL | Status: DC | PRN
Start: 2015-03-12 — End: 2015-03-13
  Administered 2015-03-12 (×2): 500 mg via ORAL
  Filled 2015-03-12 (×2): qty 1

## 2015-03-12 MED ORDER — CELECOXIB 200 MG PO CAPS
200.0000 mg | ORAL_CAPSULE | Freq: Two times a day (BID) | ORAL | Status: DC
  Administered 2015-03-12 – 2015-03-13 (×2): 200 mg via ORAL
  Filled 2015-03-12 (×3): qty 1

## 2015-03-12 MED ORDER — VANCOMYCIN HCL 1000 MG IV SOLR
INTRAVENOUS | Status: DC | PRN
  Administered 2015-03-12: 1000 mg via TOPICAL

## 2015-03-12 MED ORDER — ROCURONIUM BROMIDE 50 MG/5ML IV SOLN
INTRAVENOUS | Status: AC
  Filled 2015-03-12: qty 1

## 2015-03-12 MED ORDER — ALLOPURINOL 100 MG PO TABS
100.0000 mg | ORAL_TABLET | Freq: Two times a day (BID) | ORAL | Status: DC
  Administered 2015-03-12 – 2015-03-13 (×2): 100 mg via ORAL
  Filled 2015-03-12 (×3): qty 1

## 2015-03-12 MED ORDER — PREGABALIN 75 MG PO CAPS
150.0000 mg | ORAL_CAPSULE | Freq: Three times a day (TID) | ORAL | Status: DC
  Administered 2015-03-12 – 2015-03-13 (×3): 150 mg via ORAL
  Filled 2015-03-12 (×3): qty 2

## 2015-03-12 MED ORDER — DEXAMETHASONE SODIUM PHOSPHATE 10 MG/ML IJ SOLN
INTRAMUSCULAR | Status: AC
  Filled 2015-03-12: qty 1

## 2015-03-12 MED ORDER — ONDANSETRON HCL 4 MG/2ML IJ SOLN
INTRAMUSCULAR | Status: DC | PRN
  Administered 2015-03-12: 4 mg via INTRAVENOUS

## 2015-03-12 MED ORDER — METHOCARBAMOL 1000 MG/10ML IJ SOLN
500.0000 mg | Freq: Four times a day (QID) | INTRAVENOUS | Status: DC | PRN
  Filled 2015-03-12: qty 5

## 2015-03-12 MED ORDER — THROMBIN 5000 UNITS EX SOLR
CUTANEOUS | Status: DC | PRN
  Administered 2015-03-12: 11:00:00 via TOPICAL

## 2015-03-12 MED ORDER — POTASSIUM CHLORIDE IN NACL 20-0.9 MEQ/L-% IV SOLN
INTRAVENOUS | Status: DC
  Filled 2015-03-12 (×3): qty 1000

## 2015-03-12 MED ORDER — SUGAMMADEX SODIUM 500 MG/5ML IV SOLN
INTRAVENOUS | Status: AC
  Filled 2015-03-12: qty 5

## 2015-03-12 MED ORDER — SODIUM CHLORIDE 0.9 % IJ SOLN: 3.0000 mL | INTRAMUSCULAR | Status: DC | PRN

## 2015-03-12 MED ORDER — LIDOCAINE HCL (CARDIAC) 20 MG/ML IV SOLN
INTRAVENOUS | Status: DC | PRN
  Administered 2015-03-12: 60 mg via INTRAVENOUS

## 2015-03-12 MED ORDER — EPHEDRINE SULFATE 50 MG/ML IJ SOLN
INTRAMUSCULAR | Status: AC
  Filled 2015-03-12: qty 1

## 2015-03-12 MED ORDER — LACTATED RINGERS IV SOLN
INTRAVENOUS | Status: DC
  Administered 2015-03-12 (×3): via INTRAVENOUS

## 2015-03-12 MED ORDER — LISINOPRIL 20 MG PO TABS
20.0000 mg | ORAL_TABLET | Freq: Every day | ORAL | Status: DC
  Administered 2015-03-13: 20 mg via ORAL
  Filled 2015-03-12: qty 1

## 2015-03-12 MED ORDER — OXYCODONE HCL 5 MG PO TABS
ORAL_TABLET | ORAL | Status: AC
  Filled 2015-03-12: qty 2

## 2015-03-12 MED ORDER — STERILE WATER FOR INJECTION IJ SOLN
INTRAMUSCULAR | Status: AC
  Filled 2015-03-12: qty 10

## 2015-03-12 MED ORDER — HYDROMORPHONE HCL 1 MG/ML IJ SOLN
INTRAMUSCULAR | Status: AC
  Filled 2015-03-12: qty 1

## 2015-03-12 MED ORDER — ACETAMINOPHEN 650 MG RE SUPP: 650.0000 mg | RECTAL | Status: DC | PRN

## 2015-03-12 MED ORDER — METFORMIN HCL 500 MG PO TABS
500.0000 mg | ORAL_TABLET | Freq: Two times a day (BID) | ORAL | Status: DC
  Administered 2015-03-12 – 2015-03-13 (×2): 500 mg via ORAL
  Filled 2015-03-12 (×2): qty 1

## 2015-03-12 MED ORDER — 0.9 % SODIUM CHLORIDE (POUR BTL) OPTIME
TOPICAL | Status: DC | PRN
Start: 2015-03-12 — End: 2015-03-12
  Administered 2015-03-12: 1000 mL

## 2015-03-12 MED ORDER — ONDANSETRON HCL 4 MG/2ML IJ SOLN: 4.0000 mg | INTRAMUSCULAR | Status: DC | PRN

## 2015-03-12 MED ORDER — HYDROCHLOROTHIAZIDE 25 MG PO TABS
25.0000 mg | ORAL_TABLET | Freq: Every day | ORAL | Status: DC
  Administered 2015-03-13: 25 mg via ORAL
  Filled 2015-03-12: qty 1

## 2015-03-12 MED ORDER — PHENOL 1.4 % MT LIQD: 1.0000 | OROMUCOSAL | Status: DC | PRN

## 2015-03-12 MED ORDER — SODIUM CHLORIDE 0.9 % IV SOLN: 250.0000 mL | INTRAVENOUS | Status: DC

## 2015-03-12 MED ORDER — PROPOFOL 10 MG/ML IV BOLUS
INTRAVENOUS | Status: DC | PRN
  Administered 2015-03-12: 150 mg via INTRAVENOUS

## 2015-03-12 MED ORDER — SUCCINYLCHOLINE CHLORIDE 20 MG/ML IJ SOLN
INTRAMUSCULAR | Status: AC
  Filled 2015-03-12: qty 1

## 2015-03-12 MED ORDER — MORPHINE SULFATE (PF) 2 MG/ML IV SOLN: 1.0000 mg | INTRAVENOUS | Status: DC | PRN

## 2015-03-12 MED ORDER — THROMBIN 5000 UNITS EX SOLR
CUTANEOUS | Status: DC | PRN
  Administered 2015-03-12 (×2): 5000 [IU] via TOPICAL

## 2015-03-12 MED ORDER — OXYCODONE HCL 5 MG PO TABS
10.0000 mg | ORAL_TABLET | ORAL | Status: DC | PRN
  Administered 2015-03-12 – 2015-03-13 (×4): 10 mg via ORAL
  Filled 2015-03-12 (×3): qty 2

## 2015-03-12 MED ORDER — HYDROMORPHONE HCL 1 MG/ML IJ SOLN
0.2500 mg | INTRAMUSCULAR | Status: DC | PRN
  Administered 2015-03-12 (×3): 0.5 mg via INTRAVENOUS

## 2015-03-12 MED ORDER — SUGAMMADEX SODIUM 200 MG/2ML IV SOLN
INTRAVENOUS | Status: DC | PRN
  Administered 2015-03-12: 400 mg via INTRAVENOUS

## 2015-03-12 MED ORDER — VANCOMYCIN HCL 1000 MG IV SOLR
INTRAVENOUS | Status: AC
  Filled 2015-03-12: qty 1000

## 2015-03-12 MED ORDER — SODIUM CHLORIDE 0.9 % IR SOLN
Status: DC | PRN
  Administered 2015-03-12: 11:00:00

## 2015-03-12 MED ORDER — BUPIVACAINE HCL (PF) 0.25 % IJ SOLN
INTRAMUSCULAR | Status: DC | PRN
  Administered 2015-03-12: 6 mL

## 2015-03-12 MED ORDER — MENTHOL 3 MG MT LOZG
1.0000 | LOZENGE | OROMUCOSAL | Status: DC | PRN
Start: 2015-03-12 — End: 2015-03-13

## 2015-03-12 MED ORDER — MIDAZOLAM HCL 2 MG/2ML IJ SOLN
INTRAMUSCULAR | Status: AC
  Filled 2015-03-12: qty 2

## 2015-03-12 MED ORDER — EPHEDRINE SULFATE 50 MG/ML IJ SOLN
INTRAMUSCULAR | Status: DC | PRN
  Administered 2015-03-12 (×3): 10 mg via INTRAVENOUS

## 2015-03-12 MED ORDER — METHOCARBAMOL 500 MG PO TABS
ORAL_TABLET | ORAL | Status: AC
  Filled 2015-03-12: qty 1

## 2015-03-12 MED ORDER — DOCUSATE SODIUM 100 MG PO CAPS
100.0000 mg | ORAL_CAPSULE | Freq: Two times a day (BID) | ORAL | Status: DC
  Administered 2015-03-12 – 2015-03-13 (×2): 100 mg via ORAL
  Filled 2015-03-12 (×3): qty 1

## 2015-03-12 MED ORDER — ACETAMINOPHEN 325 MG PO TABS: 650.0000 mg | ORAL_TABLET | ORAL | Status: DC | PRN

## 2015-03-12 MED ORDER — PROMETHAZINE HCL 25 MG/ML IJ SOLN: 6.2500 mg | INTRAMUSCULAR | Status: DC | PRN

## 2015-03-12 MED ORDER — FENTANYL CITRATE (PF) 100 MCG/2ML IJ SOLN
INTRAMUSCULAR | Status: DC | PRN
  Administered 2015-03-12: 100 ug via INTRAVENOUS

## 2015-03-12 MED ORDER — LISINOPRIL-HYDROCHLOROTHIAZIDE 20-25 MG PO TABS: 1.0000 | ORAL_TABLET | Freq: Every day | ORAL | Status: DC

## 2015-03-12 MED ORDER — VANCOMYCIN HCL 10 G IV SOLR
1500.0000 mg | Freq: Once | INTRAVENOUS | Status: AC
  Administered 2015-03-12: 1500 mg via INTRAVENOUS
  Filled 2015-03-12: qty 1500

## 2015-03-12 MED ORDER — FENTANYL CITRATE (PF) 250 MCG/5ML IJ SOLN
INTRAMUSCULAR | Status: AC
  Filled 2015-03-12: qty 5

## 2015-03-12 SURGICAL SUPPLY — 41 items
APL SKNCLS STERI-STRIP NONHPOA (GAUZE/BANDAGES/DRESSINGS) ×1
BAG DECANTER FOR FLEXI CONT (MISCELLANEOUS) ×3 IMPLANT
BENZOIN TINCTURE PRP APPL 2/3 (GAUZE/BANDAGES/DRESSINGS) ×3 IMPLANT
BUR MATCHSTICK NEURO 3.0 LAGG (BURR) ×3 IMPLANT
CANISTER SUCT 3000ML PPV (MISCELLANEOUS) ×3 IMPLANT
CLOSURE WOUND 1/2 X4 (GAUZE/BANDAGES/DRESSINGS) ×1
DRAPE LAPAROTOMY 100X72X124 (DRAPES) ×3 IMPLANT
DRAPE MICROSCOPE LEICA (MISCELLANEOUS) ×3 IMPLANT
DRAPE POUCH INSTRU U-SHP 10X18 (DRAPES) ×3 IMPLANT
DRAPE SURG 17X23 STRL (DRAPES) ×3 IMPLANT
DRSG OPSITE POSTOP 4X6 (GAUZE/BANDAGES/DRESSINGS) ×2 IMPLANT
DURAPREP 26ML APPLICATOR (WOUND CARE) ×3 IMPLANT
ELECT REM PT RETURN 9FT ADLT (ELECTROSURGICAL) ×3
ELECTRODE REM PT RTRN 9FT ADLT (ELECTROSURGICAL) ×1 IMPLANT
GAUZE SPONGE 4X4 16PLY XRAY LF (GAUZE/BANDAGES/DRESSINGS) IMPLANT
GLOVE BIO SURGEON STRL SZ8 (GLOVE) ×3 IMPLANT
GOWN STRL REUS W/ TWL LRG LVL3 (GOWN DISPOSABLE) IMPLANT
GOWN STRL REUS W/ TWL XL LVL3 (GOWN DISPOSABLE) ×1 IMPLANT
GOWN STRL REUS W/TWL 2XL LVL3 (GOWN DISPOSABLE) IMPLANT
GOWN STRL REUS W/TWL LRG LVL3 (GOWN DISPOSABLE)
GOWN STRL REUS W/TWL XL LVL3 (GOWN DISPOSABLE) ×3
HEMOSTAT POWDER KIT SURGIFOAM (HEMOSTASIS) IMPLANT
KIT BASIN OR (CUSTOM PROCEDURE TRAY) ×3 IMPLANT
KIT ROOM TURNOVER OR (KITS) ×3 IMPLANT
NDL HYPO 25X1 1.5 SAFETY (NEEDLE) ×1 IMPLANT
NDL SPNL 20GX3.5 QUINCKE YW (NEEDLE) IMPLANT
NEEDLE HYPO 25X1 1.5 SAFETY (NEEDLE) ×3 IMPLANT
NEEDLE SPNL 20GX3.5 QUINCKE YW (NEEDLE) IMPLANT
NS IRRIG 1000ML POUR BTL (IV SOLUTION) ×3 IMPLANT
PACK LAMINECTOMY NEURO (CUSTOM PROCEDURE TRAY) ×3 IMPLANT
PAD ARMBOARD 7.5X6 YLW CONV (MISCELLANEOUS) ×9 IMPLANT
RUBBERBAND STERILE (MISCELLANEOUS) ×6 IMPLANT
SPONGE SURGIFOAM ABS GEL SZ50 (HEMOSTASIS) ×3 IMPLANT
STRIP CLOSURE SKIN 1/2X4 (GAUZE/BANDAGES/DRESSINGS) ×2 IMPLANT
SUT VIC AB 0 CT1 18XCR BRD8 (SUTURE) ×1 IMPLANT
SUT VIC AB 0 CT1 8-18 (SUTURE) ×3
SUT VIC AB 2-0 CP2 18 (SUTURE) ×3 IMPLANT
SUT VIC AB 3-0 SH 8-18 (SUTURE) ×3 IMPLANT
TOWEL OR 17X24 6PK STRL BLUE (TOWEL DISPOSABLE) ×3 IMPLANT
TOWEL OR 17X26 10 PK STRL BLUE (TOWEL DISPOSABLE) ×3 IMPLANT
WATER STERILE IRR 1000ML POUR (IV SOLUTION) ×3 IMPLANT

## 2015-03-12 NOTE — Op Note (Signed)
03/12/2015  12:35 PM  PATIENT:  Amber Ashley  69 y.o. female  PRE-OPERATIVE DIAGNOSIS:  Lumbar spinal stenosis L3-4 L4-5  POST-OPERATIVE DIAGNOSIS:  Same  PROCEDURE:  Bilateral decompressive hemilaminectomies and medial facetectomies and foraminotomies at L3-4 and L4-5  SURGEON:  Marikay Alaravid Jonie Burdell, MD  ASSISTANTS: None  ANESTHESIA:   General  EBL: 100 ml  Total I/O In: 1000 [I.V.:1000] Out: 150 [Blood:150]  BLOOD ADMINISTERED:none  DRAINS: None   SPECIMEN:  No Specimen  INDICATION FOR PROCEDURE: This patient presented with a long history of bilateral leg pain. She had an MRI which showed significant spinal stenosis at L3-4 and L4-5. She had medical management without relief. I recommended decompressive laminectomies at L3-4 and L4-5 bilaterally. Patient understood the risks, benefits, and alternatives and potential outcomes and wished to proceed.  PROCEDURE DETAILS: The patient was taken to the operating room and after induction of adequate generalized endotracheal anesthesia, the patient was rolled into the prone position on the Wilson frame and all pressure points were padded. The lumbar region was cleaned and then prepped with DuraPrep and draped in the usual sterile fashion. 5 cc of local anesthesia was injected and then a dorsal midline incision was made and carried down to the lumbo sacral fascia. The fascia was opened and the paraspinous musculature was taken down in a subperiosteal fashion to expose L3-4 and L4-5 bilaterally. Intraoperative x-ray confirmed my level, and then I used a combination of the high-speed drill and the Kerrison punches to perform a hemilaminectomy, medial facetectomy, and foraminotomy at L3-4 and L4-5 bilaterally. The underlying yellow ligament was opened and removed in a piecemeal fashion to expose the underlying dura and exiting nerve root at each level bilaterally. I undercut the lateral recess and dissected down until I was medial to and distal to  the pedicle. The nerve root was well decompressed bilaterally at each level.  I then palpated with a coronary dilator along the nerve root and into the foramen to assure adequate decompression. I felt no more compression of the nerve root. I irrigated with saline solution containing bacitracin. Achieved hemostasis with bipolar cautery, lined the dura with Gelfoam, and then closed the fascia with 0 Vicryl. I closed the subcutaneous tissues with 2-0 Vicryl and the subcuticular tissues with 3-0 Vicryl. The skin was then closed with benzoin and Steri-Strips. The drapes were removed, a sterile dressing was applied. The patient was awakened from general anesthesia and transferred to the recovery room in stable condition. At the end of the procedure all sponge, needle and instrument counts were correct.   PLAN OF CARE: Admit to inpatient   PATIENT DISPOSITION:  PACU - hemodynamically stable.   Delay start of Pharmacological VTE agent (>24hrs) due to surgical blood loss or risk of bleeding:  yes

## 2015-03-12 NOTE — H&P (Signed)
Subjective: Patient is a 69 y.o. female admitted for lumbar spinal stenosis. Onset of symptoms was several months ago, gradually worsening since that time.  The pain is rated severe, and is located at the across the lower back and radiates to her legs. The pain is described as aching and occurs all day. The symptoms have been progressive. Symptoms are exacerbated by exercise. MRI or CT showed spinal stenosis L3-4 L4-5   Past Medical History  Diagnosis Date  . Restless leg syndrome     takes Lyrica daily  . Arthritis     degenerative lumbar spine, back injection with Dr. Sherene Sires office    . Primary localized osteoarthritis of left knee   . Gout     takes Allopurinol daily  . Constipation     takes Stool Softener daily  . Hypertension     takes Lisinopril-HCTZ daily  . Diabetes mellitus without complication (HCC)     takes Amaryl and Metformin daily  . Complication of anesthesia     woke up very slowly, states she was told that " we had a time waking me you up"  . History of blood transfusion     no abnormal reaction noted  . Weakness     numbness and tingling in both legs  . Joint pain   . Chronic back pain     stenosis  . Urinary frequency   . Nocturia     Past Surgical History  Procedure Laterality Date  . Tubal ligation    . Abdominal hysterectomy      Partial  . Bladder tacking    . Varicose vein surgery    . Cholecystectomy      [facility].   . Eye surgery      cataracts removed bilateral /w IOL  . 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 Total knee arthroplasty Left 05/19/2014    Procedure: LEFT TOTAL KNEE ARTHROPLASTY;  Surgeon: Nilda Simmer, MD;  Location: [facility] OR;  Service: Orthopedics;  Laterality: Left;  . Esophagogastroduodenoscopy      Prior to Admission medications   Medication Sig Start Date End Date Taking? Authorizing Provider  acetaminophen (TYLENOL) 325 MG tablet Take 650 mg by mouth every 6 (six) hours as needed for mild pain,  fever or headache.   Yes Historical Provider, MD  allopurinol (ZYLOPRIM) 100 MG tablet Take 100 mg by mouth 2 (two) times daily.   Yes Historical Provider, MD  celecoxib (CELEBREX) 200 MG capsule 1 tab po q day with food for pain and  swelling Patient taking differently: Take 200 mg by mouth daily.  05/21/14  Yes Kirstin Shepperson, PA-C  Cholecalciferol (VITAMIN D) 2000 UNITS CAPS Take 2,000 Units by mouth daily.   Yes Historical Provider, MD  docusate (COLACE) 50 MG/5ML liquid Take 100 mg by mouth 2 (two) times daily as needed for mild constipation.   Yes Historical Provider, MD  docusate sodium (COLACE) 100 MG capsule 1 tab 2 times a day while on narcotics.  STOOL SOFTENER Patient taking differently: Take 100 mg by mouth 2 (two) times daily as needed for mild constipation. 1 tab 2 times a day while on narcotics.  STOOL SOFTENER 05/21/14  Yes Kirstin Shepperson, PA-C  glimepiride (AMARYL) 1 MG tablet Take 1 mg by mouth daily with breakfast.   Yes Historical Provider, MD  lisinopril-hydrochlorothiazide (PRINZIDE,ZESTORETIC) 20-25 MG per tablet Take 1 tablet by mouth daily.   Yes Historical Provider, MD  metFORMIN (GLUCOPHAGE)  500 MG tablet Take 500 mg by mouth 2 (two) times daily with a meal.   Yes Historical Provider, MD  oxyCODONE (OXY IR/ROXICODONE) 5 MG immediate release tablet 1-2 tablets every 4-6 hrs as needed for pain Patient taking differently: Take 10 mg by mouth 5 (five) times daily.  05/21/14  Yes Kirstin Shepperson, PA-C  pregabalin (LYRICA) 150 MG capsule Take 150 mg by mouth 3 (three) times daily.   Yes Historical Provider, MD  rOPINIRole (REQUIP) 2 MG tablet Take 2 mg by mouth at bedtime.   Yes Historical Provider, MD   Allergies  Allergen Reactions  . Penicillins Hives    Has patient had a PCN reaction causing immediate rash, facial/tongue/throat swelling, SOB or lightheadedness with hypotension: No Has patient had a PCN reaction causing severe rash involving mucus membranes or  skin necrosis: Yes Has patient had a PCN reaction that required hospitalization No Has patient had a PCN reaction occurring within the last 10 years: Yes If all of the above answers are "NO", then may proceed with Cephalosporin use.     Social History  Substance Use Topics  . Smoking status: Never Smoker   . Smokeless tobacco: Never Used  . Alcohol Use: No    Family History  Problem Relation Age of Onset  . Stroke Mother   . Heart attack Mother     Heart attack while hospitalized with pneumonia at age 49  . Heart disease Father     Vague  . Hypertension Brother   . Diabetes Brother      Review of Systems  Positive ROS: Negative  All other systems have been reviewed and were otherwise negative with the exception of those mentioned in the HPI and as above.  Objective: Vital signs in last 24 hours: Temp:  [97.4 F (36.3 C)] 97.4 F (36.3 C) (12/08 0916) Pulse Rate:  [74] 74 (12/08 0916) Resp:  [16] 16 (12/08 0916) BP: (119)/(53) 119/53 mmHg (12/08 0916) SpO2:  [96 %] 96 % (12/08 0916)  General Appearance: Alert, cooperative, no distress, appears stated age Head: Normocephalic, without obvious abnormality, atraumatic Eyes: PERRL, conjunctiva/corneas clear, EOM's intact    Neck: Supple, symmetrical, trachea midline Back: Symmetric, no curvature, ROM normal, no CVA tenderness Lungs:  respirations unlabored Heart: Regular rate and rhythm Abdomen: Soft, non-tender Extremities: Extremities normal, atraumatic, no cyanosis or edema Pulses: 2+ and symmetric all extremities Skin: Skin color, texture, turgor normal, no rashes or lesions  NEUROLOGIC:   Mental status: Alert and oriented x4,  no aphasia, good attention span, fund of knowledge, and memory Motor Exam - grossly normal Sensory Exam - grossly normal Reflexes: 1+ Coordination - grossly normal Gait - grossly normal Balance - grossly normal Cranial Nerves: I: smell Not tested  II: visual acuity  OS: nl    OD: nl   II: visual fields Full to confrontation  II: pupils Equal, round, reactive to light  III,VII: ptosis None  III,IV,VI: extraocular muscles  Full ROM  V: mastication Normal  V: facial light touch sensation  Normal  V,VII: corneal reflex  Present  VII: facial muscle function - upper  Normal  VII: facial muscle function - lower Normal  VIII: hearing Not tested  IX: soft palate elevation  Normal  IX,X: gag reflex Present  XI: trapezius strength  5/5  XI: sternocleidomastoid strength 5/5  XI: neck flexion strength  5/5  XII: tongue strength  Normal    Data Review Lab Results  Component Value Date   WBC  8.0 03/05/2015   HGB 12.9 03/05/2015   HCT 37.4 03/05/2015   MCV 93.5 03/05/2015   PLT 270 03/05/2015   Lab Results  Component Value Date   NA 136 03/05/2015   K 4.4 03/05/2015   CL 103 03/05/2015   CO2 29 03/05/2015   BUN 30* 03/05/2015   CREATININE 0.80 03/05/2015   GLUCOSE 86 03/05/2015   Lab Results  Component Value Date   INR 1.13 03/05/2015    Assessment/Plan: Patient admitted for decompressive laminectomy for stenosis. Patient has failed a reasonable attempt at conservative therapy.  I explained the condition and procedure to the patient and answered any questions.  Patient wishes to proceed with procedure as planned. Understands risks/ benefits and typical outcomes of procedure.   JONES,DAVID S 03/12/2015 10:35 AM

## 2015-03-12 NOTE — Anesthesia Preprocedure Evaluation (Signed)
Anesthesia Evaluation  Patient identified by MRN, date of birth, ID band Patient awake    Reviewed: Allergy & Precautions, NPO status , Patient's Chart, lab work & pertinent test results  Airway Mallampati: II  TM Distance: <3 FB Neck ROM: Full    Dental no notable dental hx.    Pulmonary neg pulmonary ROS,    Pulmonary exam normal breath sounds clear to auscultation       Cardiovascular hypertension, Pt. on medications Normal cardiovascular exam Rhythm:Regular Rate:Normal     Neuro/Psych negative neurological ROS  negative psych ROS   GI/Hepatic negative GI ROS, Neg liver ROS,   Endo/Other  diabetesMorbid obesity  Renal/GU negative Renal ROS  negative genitourinary   Musculoskeletal negative musculoskeletal ROS (+)   Abdominal   Peds negative pediatric ROS (+)  Hematology negative hematology ROS (+)   Anesthesia Other Findings   Reproductive/Obstetrics negative OB ROS                            Anesthesia Physical Anesthesia Plan  ASA: III  Anesthesia Plan: General   Post-op Pain Management:    Induction: Intravenous  Airway Management Planned: Oral ETT  Additional Equipment:   Intra-op Plan:   Post-operative Plan: Extubation in OR  Informed Consent: I have reviewed the patients History and Physical, chart, labs and discussed the procedure including the risks, benefits and alternatives for the proposed anesthesia with the patient or authorized representative who has indicated his/her understanding and acceptance.   Dental advisory given  Plan Discussed with: CRNA and Surgeon  Anesthesia Plan Comments:         Anesthesia Quick Evaluation  

## 2015-03-12 NOTE — Progress Notes (Signed)
Pt's only c/o is needing to void, unable to use bedpan. Dr Yetta BarreJones here, order to in & out cath. This was done for 550 cc clear yellow uo. Pt tol procedure well and is much more comfortable.

## 2015-03-12 NOTE — Progress Notes (Signed)
Patient ID: Amber Ashley, female   DOB: 09/24/1945, 69 y.o.   MRN: [id number]  patient seems to be doing well postop with no leg pain or numbness tingling or weakness in moves her legs well

## 2015-03-12 NOTE — Progress Notes (Signed)
ANTIBIOTIC CONSULT NOTE - INITIAL  Pharmacy Consult for vancomycin Indication: surgical prophylaxis   Patient Measurements:   Adjusted Body Weight:   Vital Signs: Temp: 97.7 F (36.5 C) (12/08 1430) Temp Source: Oral (12/08 0916) BP: 132/71 mmHg (12/08 1430) Pulse Rate: 93 (12/08 1430) Intake/Output from previous day:   Intake/Output from this shift: Total I/O In: 1560 [P.O.:60; I.V.:1500] Out: 700 [Urine:550; Blood:150]  Labs: No results for input(s): WBC, HGB, PLT, LABCREA, CREATININE in the last 72 hours. Estimated Creatinine Clearance: 77.2 mL/min (by C-G formula based on Cr of 0.8). No results for input(s): VANCOTROUGH, VANCOPEAK, VANCORANDOM, GENTTROUGH, GENTPEAK, GENTRANDOM, TOBRATROUGH, TOBRAPEAK, TOBRARND, AMIKACINPEAK, AMIKACINTROU, AMIKACIN in the last 72 hours.   Microbiology: Recent Results (from the past 720 hour(s))  Surgical pcr screen     Status: None   Collection Time: 03/05/15 12:19 PM  Result Value Ref Range Status   MRSA, PCR NEGATIVE NEGATIVE Final   Staphylococcus aureus NEGATIVE NEGATIVE Final    Comment:        The Xpert SA Assay (FDA approved for NASAL specimens in patients over 56 years of age), is one component of a comprehensive surveillance program.  Test performance has been validated by [facility] for patients greater than or equal to 36 year old. It is not intended to diagnose infection nor to guide or monitor treatment.     Medical History: Past Medical History  Diagnosis Date  . Restless leg syndrome     takes Lyrica daily  . Arthritis     degenerative lumbar spine, back injection with Dr. Sherene Sires office    . Primary localized osteoarthritis of left knee   . Gout     takes Allopurinol daily  . Constipation     takes Stool Softener daily  . Hypertension     takes Lisinopril-HCTZ daily  . Diabetes mellitus without complication (HCC)     takes Amaryl and Metformin daily  . Complication of anesthesia     woke up very  slowly, states she was told that " we had a time waking me you up"  . History of blood transfusion     no abnormal reaction noted  . Weakness     numbness and tingling in both legs  . Joint pain   . Chronic back pain     stenosis  . Urinary frequency   . Nocturia     Medications:  Prescriptions prior to admission  Medication Sig Dispense Refill Last Dose  . acetaminophen (TYLENOL) 325 MG tablet Take 650 mg by mouth every 6 (six) hours as needed for mild pain, fever or headache.   Past Month at Unknown time  . allopurinol (ZYLOPRIM) 100 MG tablet Take 100 mg by mouth 2 (two) times daily.   03/12/2015 at Unknown time  . celecoxib (CELEBREX) 200 MG capsule 1 tab po q day with food for pain and  swelling (Patient taking differently: Take 200 mg by mouth daily. ) 30 capsule 0 03/11/2015 at Unknown time  . Cholecalciferol (VITAMIN D) 2000 UNITS CAPS Take 2,000 Units by mouth daily.   Past Month at Unknown time  . docusate (COLACE) 50 MG/5ML liquid Take 100 mg by mouth 2 (two) times daily as needed for mild constipation.   Past Week at Unknown time  . docusate sodium (COLACE) 100 MG capsule 1 tab 2 times a day while on narcotics.  STOOL SOFTENER (Patient taking differently: Take 100 mg by mouth 2 (two) times daily as needed for mild constipation.  1 tab 2 times a day while on narcotics.  STOOL SOFTENER) 60 capsule 0   . glimepiride (AMARYL) 1 MG tablet Take 1 mg by mouth daily with breakfast.   03/11/2015 at Unknown time  . lisinopril-hydrochlorothiazide (PRINZIDE,ZESTORETIC) 20-25 MG per tablet Take 1 tablet by mouth daily.   03/12/2015 at Unknown time  . metFORMIN (GLUCOPHAGE) 500 MG tablet Take 500 mg by mouth 2 (two) times daily with a meal.   03/11/2015 at Unknown time  . oxyCODONE (OXY IR/ROXICODONE) 5 MG immediate release tablet 1-2 tablets every 4-6 hrs as needed for pain (Patient taking differently: Take 10 mg by mouth 5 (five) times daily. ) 120 tablet 0 03/12/2015 at Unknown time  . pregabalin  (LYRICA) 150 MG capsule Take 150 mg by mouth 3 (three) times daily.   03/12/2015 at Unknown time  . rOPINIRole (REQUIP) 2 MG tablet Take 2 mg by mouth at bedtime.   Past Week at Unknown time   Assessment: POD #0 bilateral decompressive hemilaminectomies, medial facetectomies and foraminotomies. Will give 1 dose of vancomycin post-op. Pt is afebrile, without leukocytosis, renal fx stable.   Goal of Therapy:  Vancomycin trough level 10-15 mcg/ml   Plan:  -Vancomycin 1500 mg IV x1 -Monitor s/sx infection   Baldemar FridayMasters, Jacorey Donaway M 03/12/2015,3:11 PM

## 2015-03-12 NOTE — Anesthesia Procedure Notes (Signed)
Procedure Name: Intubation Date/Time: 03/12/2015 10:49 AM Performed by: Reine JustFLOWERS, Pape Parson T Pre-anesthesia Checklist: Patient identified, Emergency Drugs available, Suction available, Patient being monitored and Timeout performed Patient Re-evaluated:Patient Re-evaluated prior to inductionOxygen Delivery Method: Circle system utilized and Simple face mask Preoxygenation: Pre-oxygenation with 100% oxygen Intubation Type: IV induction Ventilation: Two handed mask ventilation required, Oral airway inserted - appropriate to patient size and Mask ventilation with difficulty Laryngoscope Size: Mac and 3 Grade View: Grade I Tube type: Oral Tube size: 7.5 mm Number of attempts: 1 Airway Equipment and Method: Patient positioned with wedge pillow and Stylet Placement Confirmation: ETT inserted through vocal cords under direct vision,  positive ETCO2 and breath sounds checked- equal and bilateral Secured at: 22 cm Tube secured with: Tape Dental Injury: Teeth and Oropharynx as per pre-operative assessment

## 2015-03-12 NOTE — Progress Notes (Signed)
Lunch relief by S. Gregson RN 

## 2015-03-12 NOTE — Transfer of Care (Signed)
Immediate Anesthesia Transfer of Care Note  Patient: Amber Ashley  Procedure(s) Performed: Procedure(s): Bilateral Lumbar Three-Four, Lumbar Four-Five Laminectomy and Foraminotomy  (Bilateral)  Patient Location: PACU  Anesthesia Type:General  Level of Consciousness: awake, alert  and oriented  Airway & Oxygen Therapy: Patient Spontanous Breathing and Patient connected to nasal cannula oxygen  Post-op Assessment: Report given to RN, Post -op Vital signs reviewed and stable and Patient moving all extremities X 4  Post vital signs: Reviewed and stable  Last Vitals:  Filed Vitals:   03/12/15 0916  BP: 119/53  Pulse: 74  Temp: 36.3 C  Resp: 16    Complications: No apparent anesthesia complications

## 2015-03-12 NOTE — Anesthesia Postprocedure Evaluation (Signed)
Anesthesia Post Note  Patient: Golden HurterBetty K Stefanick  Procedure(s) Performed: Procedure(s) (LRB): Bilateral Lumbar Three-Four, Lumbar Four-Five Laminectomy and Foraminotomy  (Bilateral)  Patient location during evaluation: PACU Anesthesia Type: General Level of consciousness: awake and alert Pain management: pain level controlled Vital Signs Assessment: post-procedure vital signs reviewed and stable Respiratory status: spontaneous breathing and respiratory function stable Cardiovascular status: blood pressure returned to baseline and stable Postop Assessment: no signs of nausea or vomiting Anesthetic complications: no    Last Vitals:  Filed Vitals:   03/12/15 1340 03/12/15 1345  BP: 126/67   Pulse:  88  Temp:    Resp:  15    Last Pain:  Filed Vitals:   03/12/15 1353  PainSc: 7                  Azzure Garabedian S

## 2015-03-13 ENCOUNTER — Encounter (HOSPITAL_COMMUNITY): Payer: Self-pay | Admitting: Neurological Surgery

## 2015-03-13 LAB — GLUCOSE, CAPILLARY: GLUCOSE-CAPILLARY: 137 mg/dL — AB (ref 65–99)

## 2015-03-13 NOTE — Progress Notes (Signed)
Subjective: Patient reports "My legs don't hurt anymore. I could sleep on my left side last night!"  Objective: Vital signs in last 24 hours: Temp:  [97.1 F (36.2 C)-98.2 F (36.8 C)] 97.7 F (36.5 C) (12/09 0811) Pulse Rate:  [69-109] 69 (12/09 0811) Resp:  [11-23] 18 (12/09 0811) BP: (95-141)/(44-71) 95/48 mmHg (12/09 0811) SpO2:  [94 %-100 %] 99 % (12/09 0811)  Intake/Output from previous day: 12/08 0701 - 12/09 0700 In: 1560 [P.O.:60; I.V.:1500] Out: 850 [Urine:700; Blood:150] Intake/Output this shift:    Awakens to voice. Reports mild lumbar incisional pain, but no leg pain. Drsg bloody, but no erythema, or swelling - will change before d/c. Good strength BLE. Ambulated last night.   Lab Results: No results for input(s): WBC, HGB, HCT, PLT in the last 72 hours. BMET No results for input(s): NA, K, CL, CO2, GLUCOSE, BUN, CREATININE, CALCIUM in the last 72 hours.  Studies/Results: Dg Lumbar Spine 2-3 Views  03/12/2015  CLINICAL DATA:  Intraoperative localization. Elective surgery. Lumbar pain. EXAM: LUMBAR SPINE - 2-3 VIEW COMPARISON:  MRI 04/11/2014 FINDINGS: FILM #1: A surgical instrument is identified posterior to the disc space at L4-5. FILM #2: Posterior retractor is in place. A surgical instrument is identified posterior to the disc space at L3-4. IMPRESSION: Intraoperative localization. Electronically Signed   By: Norva PavlovElizabeth  Brown M.D.   On: 03/12/2015 14:12    Assessment/Plan: Improving   LOS: 1 day  Per DrStern, change DSD prn, d/c IV, d/c to home. Rx's to chart for Percocet 10/325 1/2-1 po q4hrs prn pain, Robaxin 500mg  1 po TID prn spasm. Pt verbalizes understanding of d/c instructions. She already has appt with DrJones Dec 20.    Georgiann Cockeroteat, Nickayla Mcinnis 03/13/2015, 8:34 AM

## 2015-03-13 NOTE — Progress Notes (Signed)
Pt given D/C instructions with Rx's verbal understanding was provided. Pt's incision is covered with gauze dressing and has minimal drainage. Pt's IV was removed prior to D/C. Pt D/C'd home via wheelchair @ 1150 per MD order. Pt is stable @ D/C and has no other needs at this time. Rema FendtAshley Taja Pentland, RN

## 2015-03-13 NOTE — Discharge Instructions (Signed)

## 2015-03-13 NOTE — Discharge Summary (Signed)
Physician Discharge Summary  Patient ID: Amber HurterBetty K Ashley MRN: [id number] DOB/AGE: 67/10/1945 67 y.o.  Admit date: 03/12/2015 Discharge date: 03/13/2015  Admission Diagnoses: Lumbar spinal stenosis L3-4 L4-5   Discharge Diagnoses: Lumbar spinal stenosis L3-4 L4-5 s/p Bilateral decompressive hemilaminectomies and medial facetectomies and foraminotomies at L3-4 and L4-5  Active Problems:   S/P lumbar laminectomy   Discharged Condition: good  Hospital Course: [name] was admitted for surgery with dx stenosis.  Following uncomplicated laminectomy, she recovered well and transferred to [facility] for nursing care.  She has mobilized nicely.   Consults: None  Significant Diagnostic Studies: radiology: X-Ray: intra-operative  Treatments: surgery: Bilateral decompressive hemilaminectomies and medial facetectomies and foraminotomies at L3-4 and L4-5   Discharge Exam: Blood pressure 95/48, pulse 69, temperature 97.7 F (36.5 C), temperature source Oral, resp. rate 18, SpO2 99 %. Awakens to voice. (Asymptomatic of 95/S- will be reassessed before discharge.)Reports mild lumbar incisional pain, but no leg pain. Drsg bloody, but no erythema, or swelling - will change before d/c. Good strength BLE. Ambulated last night.    Disposition: Discharge to home.  Rx's to chart for Percocet 10/325 1/2-1 po q4hrs prn pain, Robaxin 500mg  1 po TID prn spasm. Pt verbalizes understanding of d/c instructions. She already has appt with DrJones Dec 20.       Medication List    ASK your doctor about these medications        acetaminophen 325 MG tablet  Commonly known as:  TYLENOL  Take 650 mg by mouth every 6 (six) hours as needed for mild pain, fever or headache.     allopurinol 100 MG tablet  Commonly known as:  ZYLOPRIM  Take 100 mg by mouth 2 (two) times daily.     celecoxib 200 MG capsule  Commonly known as:  CELEBREX  1 tab po q day with food for pain and  swelling     docusate 50 MG/5ML  liquid  Commonly known as:  COLACE  Take 100 mg by mouth 2 (two) times daily as needed for mild constipation.     docusate sodium 100 MG capsule  Commonly known as:  COLACE  1 tab 2 times a day while on narcotics.  STOOL SOFTENER     glimepiride 1 MG tablet  Commonly known as:  AMARYL  Take 1 mg by mouth daily with breakfast.     lisinopril-hydrochlorothiazide 20-25 MG tablet  Commonly known as:  PRINZIDE,ZESTORETIC  Take 1 tablet by mouth daily.     metFORMIN 500 MG tablet  Commonly known as:  GLUCOPHAGE  Take 500 mg by mouth 2 (two) times daily with a meal.     oxyCODONE 5 MG immediate release tablet  Commonly known as:  Oxy IR/ROXICODONE  1-2 tablets every 4-6 hrs as needed for pain     pregabalin 150 MG capsule  Commonly known as:  LYRICA  Take 150 mg by mouth 3 (three) times daily.     rOPINIRole 2 MG tablet  Commonly known as:  REQUIP  Take 2 mg by mouth at bedtime.     Vitamin D 2000 UNITS Caps  Take 2,000 Units by mouth daily.         Signed: Georgiann Cockeroteat, Louis Ivery 03/13/2015, 8:39 AM

## 2015-03-13 NOTE — Progress Notes (Signed)
Inpatient Diabetes Program Recommendations  AACE/ADA: New Consensus Statement on Inpatient Glycemic Control (2015)  Target Ranges:  Prepandial:   less than 140 mg/dL      Peak postprandial:   less than 180 mg/dL (1-2 hours)      Critically ill patients:  140 - 180 mg/dL   Review of Glycemic Control  Inpatient Diabetes Program Recommendations:  Correction (SSI): consider adding Novolog sensitive TID while hospitalized Thank you  Piedad ClimesGina Antoinne Spadaccini BSN, RN,CDE Inpatient Diabetes Coordinator [phone] (team pager)

## 2015-04-17 ENCOUNTER — Other Ambulatory Visit (HOSPITAL_COMMUNITY): Payer: Self-pay | Admitting: Neurological Surgery

## 2015-04-20 ENCOUNTER — Encounter (HOSPITAL_COMMUNITY): Payer: Self-pay | Admitting: *Deleted

## 2015-04-20 MED ORDER — DEXAMETHASONE SODIUM PHOSPHATE 10 MG/ML IJ SOLN
10.0000 mg | INTRAMUSCULAR | Status: DC
  Filled 2015-04-20: qty 1

## 2015-04-20 MED ORDER — VANCOMYCIN HCL IN DEXTROSE 1-5 GM/200ML-% IV SOLN
1000.0000 mg | INTRAVENOUS | Status: AC
  Administered 2015-04-21: 1000 mg via INTRAVENOUS
  Filled 2015-04-20: qty 200

## 2015-04-20 NOTE — Progress Notes (Signed)
Pt denies SOB, chest pain, and being under the care of a cardiologist. Pt denies having a stress test, echo and cardiac cath. Pt made aware to stop taking Aspirin, otc vitamins, fish oil and herbal medications. Do not take any NSAIDs ie: Ibuprofen, Advil, Naproxen or any medication containing Aspirin.Marland Kitchen. Pt made aware to not take any diabetic oral medication the morning of procedure such as Metformin or Glimepiride. Pt made aware of BS protocol ( BS<70 and > 220) in detail and pt verbalized understanding of all pre-op instructions.

## 2015-04-20 NOTE — Anesthesia Preprocedure Evaluation (Addendum)
Anesthesia Evaluation  Patient identified by MRN, date of birth, ID band Patient awake    Reviewed: Allergy & Precautions, NPO status , Patient's Chart, lab work & pertinent test results  History of Anesthesia Complications (+) PROLONGED EMERGENCE and history of anesthetic complications  Airway Mallampati: II  TM Distance: <3 FB Neck ROM: Full    Dental no notable dental hx. (+) Partial Upper, Dental Advisory Given, Poor Dentition   Pulmonary neg pulmonary ROS,    Pulmonary exam normal breath sounds clear to auscultation       Cardiovascular hypertension, Pt. on medications Normal cardiovascular exam Rhythm:Regular Rate:Normal  EKG 05/2014 OK   Neuro/Psych negative neurological ROS  negative psych ROS   GI/Hepatic negative GI ROS, Neg liver ROS,   Endo/Other  diabetes, Poorly Controlled, Type 2Morbid obesity  Renal/GU negative Renal ROS  negative genitourinary   Musculoskeletal negative musculoskeletal ROS (+)   Abdominal (+) + obese,   Peds negative pediatric ROS (+)  Hematology negative hematology ROS (+)   Anesthesia Other Findings   Reproductive/Obstetrics negative OB ROS                          Anesthesia Physical  Anesthesia Plan  ASA: III  Anesthesia Plan: General   Post-op Pain Management:    Induction: Intravenous  Airway Management Planned: Oral ETT  Additional Equipment:   Intra-op Plan:   Post-operative Plan: Extubation in OR  Informed Consent: I have reviewed the patients History and Physical, chart, labs and discussed the procedure including the risks, benefits and alternatives for the proposed anesthesia with the patient or authorized representative who has indicated his/her understanding and acceptance.   Dental advisory given  Plan Discussed with: CRNA and Surgeon  Anesthesia Plan Comments: (Multi modal pain RX,GLIDE available)       Anesthesia  Quick Evaluation

## 2015-04-20 NOTE — Progress Notes (Signed)
Pt stated that she does not check her BS in the AM, therefore, pt is unable to provide an estimate of fasting blood sugars; pt made aware to check BS the morning of procedure.

## 2015-04-21 ENCOUNTER — Ambulatory Visit (HOSPITAL_COMMUNITY)
Admission: RE | Admit: 2015-04-21 | Discharge: 2015-04-21 | Disposition: A | Discharge status: Home / Self Care | Payer: Medicare Other | Source: Ambulatory Visit | Attending: Neurological Surgery | Admitting: Neurological Surgery

## 2015-04-21 ENCOUNTER — Encounter (HOSPITAL_COMMUNITY)
Admission: RE | Disposition: A | Discharge status: Home / Self Care | Payer: Self-pay | Source: Ambulatory Visit | Attending: Neurological Surgery

## 2015-04-21 ENCOUNTER — Ambulatory Visit (HOSPITAL_COMMUNITY): Payer: Medicare Other | Admitting: Certified Registered Nurse Anesthetist

## 2015-04-21 ENCOUNTER — Encounter (HOSPITAL_COMMUNITY): Payer: Self-pay | Admitting: Surgery

## 2015-04-21 DIAGNOSIS — M1712 Unilateral primary osteoarthritis, left knee: Secondary | ICD-10-CM | POA: Insufficient documentation

## 2015-04-21 DIAGNOSIS — T8131XA Disruption of external operation (surgical) wound, not elsewhere classified, initial encounter: Secondary | ICD-10-CM | POA: Diagnosis not present

## 2015-04-21 DIAGNOSIS — Z6838 Body mass index (BMI) 38.0-38.9, adult: Secondary | ICD-10-CM | POA: Insufficient documentation

## 2015-04-21 DIAGNOSIS — Z7984 Long term (current) use of oral hypoglycemic drugs: Secondary | ICD-10-CM | POA: Diagnosis not present

## 2015-04-21 DIAGNOSIS — M109 Gout, unspecified: Secondary | ICD-10-CM | POA: Insufficient documentation

## 2015-04-21 DIAGNOSIS — Z79899 Other long term (current) drug therapy: Secondary | ICD-10-CM | POA: Insufficient documentation

## 2015-04-21 DIAGNOSIS — G2581 Restless legs syndrome: Secondary | ICD-10-CM | POA: Diagnosis not present

## 2015-04-21 DIAGNOSIS — E119 Type 2 diabetes mellitus without complications: Secondary | ICD-10-CM | POA: Diagnosis not present

## 2015-04-21 DIAGNOSIS — M479 Spondylosis, unspecified: Secondary | ICD-10-CM | POA: Diagnosis not present

## 2015-04-21 DIAGNOSIS — M549 Dorsalgia, unspecified: Secondary | ICD-10-CM | POA: Insufficient documentation

## 2015-04-21 DIAGNOSIS — G8929 Other chronic pain: Secondary | ICD-10-CM | POA: Insufficient documentation

## 2015-04-21 DIAGNOSIS — K5900 Constipation, unspecified: Secondary | ICD-10-CM | POA: Insufficient documentation

## 2015-04-21 DIAGNOSIS — Z96652 Presence of left artificial knee joint: Secondary | ICD-10-CM | POA: Diagnosis not present

## 2015-04-21 DIAGNOSIS — Z791 Long term (current) use of non-steroidal anti-inflammatories (NSAID): Secondary | ICD-10-CM | POA: Insufficient documentation

## 2015-04-21 DIAGNOSIS — T8130XA Disruption of wound, unspecified, initial encounter: Secondary | ICD-10-CM | POA: Diagnosis present

## 2015-04-21 DIAGNOSIS — I10 Essential (primary) hypertension: Secondary | ICD-10-CM | POA: Insufficient documentation

## 2015-04-21 HISTORY — PX: LUMBAR WOUND DEBRIDEMENT: SHX1988

## 2015-04-21 HISTORY — DX: Disruption of wound, unspecified, initial encounter: T81.30XA

## 2015-04-21 HISTORY — PX: LUMBAR LAMINECTOMY: SHX95

## 2015-04-21 LAB — GLUCOSE, CAPILLARY
GLUCOSE-CAPILLARY: 145 mg/dL — AB (ref 65–99)
Glucose-Capillary: 106 mg/dL — ABNORMAL HIGH (ref 65–99)

## 2015-04-21 LAB — CBC
HCT: 34.2 % — ABNORMAL LOW (ref 36.0–46.0)
HEMOGLOBIN: 11.3 g/dL — AB (ref 12.0–15.0)
MCH: 31.3 pg (ref 26.0–34.0)
MCHC: 33 g/dL (ref 30.0–36.0)
MCV: 94.7 fL (ref 78.0–100.0)
Platelets: 235 10*3/uL (ref 150–400)
RBC: 3.61 MIL/uL — ABNORMAL LOW (ref 3.87–5.11)
RDW: 14.9 % (ref 11.5–15.5)
WBC: 7.4 10*3/uL (ref 4.0–10.5)

## 2015-04-21 LAB — BASIC METABOLIC PANEL
Anion gap: 10 (ref 5–15)
BUN: 21 mg/dL — AB (ref 6–20)
CHLORIDE: 103 mmol/L (ref 101–111)
CO2: 25 mmol/L (ref 22–32)
CREATININE: 0.76 mg/dL (ref 0.44–1.00)
Calcium: 9 mg/dL (ref 8.9–10.3)
GFR calc Af Amer: >60 mL/min (ref >60)
GFR calc non Af Amer: >60 mL/min (ref >60)
Glucose, Bld: 113 mg/dL — ABNORMAL HIGH (ref 65–99)
Potassium: 3.7 mmol/L (ref 3.5–5.1)
SODIUM: 138 mmol/L (ref 135–145)

## 2015-04-21 LAB — SURGICAL PCR SCREEN
MRSA, PCR: NEGATIVE
Staphylococcus aureus: NEGATIVE

## 2015-04-21 SURGERY — LUMBAR WOUND DEBRIDEMENT
Anesthesia: General

## 2015-04-21 MED ORDER — EPHEDRINE SULFATE 50 MG/ML IJ SOLN
INTRAMUSCULAR | Status: AC
  Filled 2015-04-21: qty 1

## 2015-04-21 MED ORDER — BACITRACIN 50000 UNITS IM SOLR
INTRAMUSCULAR | Status: DC | PRN
  Administered 2015-04-21: 08:00:00

## 2015-04-21 MED ORDER — FENTANYL CITRATE (PF) 100 MCG/2ML IJ SOLN
INTRAMUSCULAR | Status: DC | PRN
  Administered 2015-04-21 (×4): 50 ug via INTRAVENOUS
  Administered 2015-04-21: 100 ug via INTRAVENOUS

## 2015-04-21 MED ORDER — KETOROLAC TROMETHAMINE 30 MG/ML IJ SOLN
INTRAMUSCULAR | Status: AC
  Filled 2015-04-21: qty 1

## 2015-04-21 MED ORDER — FENTANYL CITRATE (PF) 100 MCG/2ML IJ SOLN
INTRAMUSCULAR | Status: AC
  Filled 2015-04-21: qty 2

## 2015-04-21 MED ORDER — ROCURONIUM BROMIDE 100 MG/10ML IV SOLN
INTRAVENOUS | Status: DC | PRN
  Administered 2015-04-21: 40 mg via INTRAVENOUS

## 2015-04-21 MED ORDER — DEXMEDETOMIDINE HCL IN NACL 200 MCG/50ML IV SOLN
INTRAVENOUS | Status: AC
  Filled 2015-04-21: qty 50

## 2015-04-21 MED ORDER — THROMBIN 5000 UNITS EX SOLR
CUTANEOUS | Status: DC | PRN
  Administered 2015-04-21 (×2): 5000 [IU] via TOPICAL

## 2015-04-21 MED ORDER — VANCOMYCIN HCL 1000 MG IV SOLR
INTRAVENOUS | Status: AC
  Filled 2015-04-21: qty 1000

## 2015-04-21 MED ORDER — PHENYLEPHRINE 40 MCG/ML (10ML) SYRINGE FOR IV PUSH (FOR BLOOD PRESSURE SUPPORT)
PREFILLED_SYRINGE | INTRAVENOUS | Status: AC
  Filled 2015-04-21: qty 10

## 2015-04-21 MED ORDER — 0.9 % SODIUM CHLORIDE (POUR BTL) OPTIME
TOPICAL | Status: DC | PRN
  Administered 2015-04-21: 1000 mL

## 2015-04-21 MED ORDER — HEMOSTATIC AGENTS (NO CHARGE) OPTIME
TOPICAL | Status: DC | PRN
  Administered 2015-04-21: 1 via TOPICAL

## 2015-04-21 MED ORDER — ACETAMINOPHEN 10 MG/ML IV SOLN
INTRAVENOUS | Status: AC
  Administered 2015-04-21: 1000 mg via INTRAVENOUS
  Filled 2015-04-21: qty 100

## 2015-04-21 MED ORDER — MIDAZOLAM HCL 2 MG/2ML IJ SOLN
INTRAMUSCULAR | Status: AC
  Filled 2015-04-21: qty 2

## 2015-04-21 MED ORDER — MEPERIDINE HCL 25 MG/ML IJ SOLN: 6.2500 mg | INTRAMUSCULAR | Status: DC | PRN

## 2015-04-21 MED ORDER — VANCOMYCIN HCL 1000 MG IV SOLR
INTRAVENOUS | Status: DC | PRN
  Administered 2015-04-21: 1000 mg

## 2015-04-21 MED ORDER — SODIUM CHLORIDE 0.9 % IJ SOLN
INTRAMUSCULAR | Status: AC
  Filled 2015-04-21: qty 10

## 2015-04-21 MED ORDER — ONDANSETRON HCL 4 MG/2ML IJ SOLN
INTRAMUSCULAR | Status: AC
  Filled 2015-04-21: qty 2

## 2015-04-21 MED ORDER — PROPOFOL 10 MG/ML IV BOLUS
INTRAVENOUS | Status: AC
  Filled 2015-04-21: qty 20

## 2015-04-21 MED ORDER — FENTANYL CITRATE (PF) 250 MCG/5ML IJ SOLN
INTRAMUSCULAR | Status: AC
  Filled 2015-04-21: qty 5

## 2015-04-21 MED ORDER — PROPOFOL 10 MG/ML IV BOLUS
INTRAVENOUS | Status: DC | PRN
  Administered 2015-04-21: 20 mg via INTRAVENOUS
  Administered 2015-04-21: 180 mg via INTRAVENOUS

## 2015-04-21 MED ORDER — LACTATED RINGERS IV SOLN
INTRAVENOUS | Status: DC | PRN
  Administered 2015-04-21: 07:00:00 via INTRAVENOUS

## 2015-04-21 MED ORDER — LIDOCAINE HCL (CARDIAC) 20 MG/ML IV SOLN
INTRAVENOUS | Status: AC
  Filled 2015-04-21: qty 5

## 2015-04-21 MED ORDER — MUPIROCIN 2 % EX OINT
1.0000 "application " | TOPICAL_OINTMENT | Freq: Once | CUTANEOUS | Status: AC
  Administered 2015-04-21: 1 via TOPICAL

## 2015-04-21 MED ORDER — DEXMEDETOMIDINE HCL 200 MCG/2ML IV SOLN
INTRAVENOUS | Status: DC | PRN
  Administered 2015-04-21: 10 ug via INTRAVENOUS

## 2015-04-21 MED ORDER — ROCURONIUM BROMIDE 50 MG/5ML IV SOLN
INTRAVENOUS | Status: AC
  Filled 2015-04-21: qty 1

## 2015-04-21 MED ORDER — MIDAZOLAM HCL 2 MG/2ML IJ SOLN
INTRAMUSCULAR | Status: DC | PRN
  Administered 2015-04-21: 2 mg via INTRAVENOUS

## 2015-04-21 MED ORDER — SUGAMMADEX SODIUM 200 MG/2ML IV SOLN
INTRAVENOUS | Status: AC
  Filled 2015-04-21: qty 2

## 2015-04-21 MED ORDER — SUGAMMADEX SODIUM 200 MG/2ML IV SOLN
INTRAVENOUS | Status: DC | PRN
  Administered 2015-04-21: 200 mg via INTRAVENOUS

## 2015-04-21 MED ORDER — GLYCOPYRROLATE 0.2 MG/ML IJ SOLN
INTRAMUSCULAR | Status: AC
  Filled 2015-04-21: qty 1

## 2015-04-21 MED ORDER — ONDANSETRON HCL 4 MG/2ML IJ SOLN
INTRAMUSCULAR | Status: DC | PRN
  Administered 2015-04-21: 4 mg via INTRAVENOUS

## 2015-04-21 MED ORDER — SUCCINYLCHOLINE CHLORIDE 20 MG/ML IJ SOLN
INTRAMUSCULAR | Status: AC
  Filled 2015-04-21: qty 1

## 2015-04-21 MED ORDER — LIDOCAINE HCL (CARDIAC) 20 MG/ML IV SOLN
INTRAVENOUS | Status: DC | PRN
  Administered 2015-04-21: 100 mg via INTRAVENOUS

## 2015-04-21 MED ORDER — MUPIROCIN 2 % EX OINT
TOPICAL_OINTMENT | CUTANEOUS | Status: AC
  Administered 2015-04-21: 1 via TOPICAL
  Filled 2015-04-21: qty 22

## 2015-04-21 MED ORDER — KETOROLAC TROMETHAMINE 15 MG/ML IJ SOLN
INTRAMUSCULAR | Status: DC | PRN
  Administered 2015-04-21: 15 mg via INTRAVENOUS

## 2015-04-21 MED ORDER — FENTANYL CITRATE (PF) 100 MCG/2ML IJ SOLN
25.0000 ug | INTRAMUSCULAR | Status: DC | PRN
  Administered 2015-04-21 (×3): 50 ug via INTRAVENOUS

## 2015-04-21 MED ORDER — PROMETHAZINE HCL 25 MG/ML IJ SOLN: 6.2500 mg | INTRAMUSCULAR | Status: DC | PRN

## 2015-04-21 SURGICAL SUPPLY — 44 items
APL SKNCLS STERI-STRIP NONHPOA (GAUZE/BANDAGES/DRESSINGS) ×1
BAG DECANTER FOR FLEXI CONT (MISCELLANEOUS) ×6 IMPLANT
BENZOIN TINCTURE PRP APPL 2/3 (GAUZE/BANDAGES/DRESSINGS) ×3 IMPLANT
CANISTER SUCT 3000ML PPV (MISCELLANEOUS) ×3 IMPLANT
CLOSURE WOUND 1/2 X4 (GAUZE/BANDAGES/DRESSINGS) ×1
DRAPE LAPAROTOMY 100X72X124 (DRAPES) ×3 IMPLANT
DRAPE POUCH INSTRU U-SHP 10X18 (DRAPES) ×3 IMPLANT
DRSG AQUACEL AG ADV 3.5X 4 (GAUZE/BANDAGES/DRESSINGS) ×2 IMPLANT
DURAPREP 26ML APPLICATOR (WOUND CARE) IMPLANT
DURAPREP 6ML APPLICATOR 50/CS (WOUND CARE) IMPLANT
ELECT REM PT RETURN 9FT ADLT (ELECTROSURGICAL) ×3
ELECTRODE REM PT RTRN 9FT ADLT (ELECTROSURGICAL) ×1 IMPLANT
GAUZE SPONGE 4X4 16PLY XRAY LF (GAUZE/BANDAGES/DRESSINGS) IMPLANT
GLOVE BIO SURGEON STRL SZ8 (GLOVE) ×3 IMPLANT
GLOVE ECLIPSE 7.5 STRL STRAW (GLOVE) ×6 IMPLANT
GLOVE INDICATOR 8.0 STRL GRN (GLOVE) ×4 IMPLANT
GOWN STRL REUS W/ TWL LRG LVL3 (GOWN DISPOSABLE) IMPLANT
GOWN STRL REUS W/ TWL XL LVL3 (GOWN DISPOSABLE) IMPLANT
GOWN STRL REUS W/TWL 2XL LVL3 (GOWN DISPOSABLE) ×3 IMPLANT
GOWN STRL REUS W/TWL LRG LVL3 (GOWN DISPOSABLE)
GOWN STRL REUS W/TWL XL LVL3 (GOWN DISPOSABLE)
KIT BASIN OR (CUSTOM PROCEDURE TRAY) ×3 IMPLANT
KIT ROOM TURNOVER OR (KITS) ×3 IMPLANT
LIQUID BAND (GAUZE/BANDAGES/DRESSINGS) ×2 IMPLANT
NDL HYPO 18GX1.5 BLUNT FILL (NEEDLE) IMPLANT
NDL HYPO 25X1 1.5 SAFETY (NEEDLE) ×1 IMPLANT
NDL SPNL 20GX3.5 QUINCKE YW (NEEDLE) IMPLANT
NEEDLE HYPO 18GX1.5 BLUNT FILL (NEEDLE) IMPLANT
NEEDLE HYPO 25X1 1.5 SAFETY (NEEDLE) ×3 IMPLANT
NEEDLE SPNL 20GX3.5 QUINCKE YW (NEEDLE) ×3 IMPLANT
NS IRRIG 1000ML POUR BTL (IV SOLUTION) ×3 IMPLANT
PACK LAMINECTOMY NEURO (CUSTOM PROCEDURE TRAY) ×3 IMPLANT
PAD ARMBOARD 7.5X6 YLW CONV (MISCELLANEOUS) ×9 IMPLANT
STRIP CLOSURE SKIN 1/2X4 (GAUZE/BANDAGES/DRESSINGS) ×2 IMPLANT
SUT VIC AB 0 CT1 18XCR BRD8 (SUTURE) ×1 IMPLANT
SUT VIC AB 0 CT1 8-18 (SUTURE) ×3
SUT VIC AB 2-0 CP2 18 (SUTURE) ×3 IMPLANT
SUT VIC AB 3-0 SH 8-18 (SUTURE) ×5 IMPLANT
SWAB COLLECTION DEVICE MRSA (MISCELLANEOUS) ×3 IMPLANT
SYR 3ML LL SCALE MARK (SYRINGE) IMPLANT
TOWEL OR 17X24 6PK STRL BLUE (TOWEL DISPOSABLE) ×3 IMPLANT
TOWEL OR 17X26 10 PK STRL BLUE (TOWEL DISPOSABLE) ×3 IMPLANT
TUBE ANAEROBIC SPECIMEN COL (MISCELLANEOUS) ×3 IMPLANT
WATER STERILE IRR 1000ML POUR (IV SOLUTION) ×3 IMPLANT

## 2015-04-21 NOTE — Progress Notes (Signed)
Pt on side , screaming in pain, meds given per CRNA, Dr Berneice Heinrich at bedside,bruise noted to left eyelid when pt rolled onto back , MD aware, pt calming after meds

## 2015-04-21 NOTE — Anesthesia Procedure Notes (Signed)
Procedure Name: Intubation Date/Time: 04/21/2015 7:44 AM Performed by: Lovie Chol Pre-anesthesia Checklist: Patient identified, Emergency Drugs available, Suction available, Patient being monitored and Timeout performed Patient Re-evaluated:Patient Re-evaluated prior to inductionOxygen Delivery Method: Circle system utilized Preoxygenation: Pre-oxygenation with 100% oxygen Intubation Type: IV induction Ventilation: Mask ventilation without difficulty and Oral airway inserted - appropriate to patient size Laryngoscope Size: Miller and 2 Grade View: Grade I Tube type: Oral Tube size: 7.0 mm Number of attempts: 1 Airway Equipment and Method: Stylet Placement Confirmation: ETT inserted through vocal cords under direct vision,  positive ETCO2,  CO2 detector and breath sounds checked- equal and bilateral Secured at: 20 cm Tube secured with: Tape Dental Injury: Teeth and Oropharynx as per pre-operative assessment

## 2015-04-21 NOTE — H&P (Signed)
Subjective: Patient is a 70 y.o. female admitted for wound revision. Onset of symptoms was 3 weeks ago, stable since that time.  The pain is rated moderate, and is located at the across the lower back and radiates to legs. The pain is described as aching and occurs intermittently. The symptoms have not been progressive. Symptoms are exacerbated by exercise. She has a superficial wound dehiscence and first available appt with the wound care clinic for topical treatment was several weeks from now, so we felt better about revision and primary closure. She is on doxy to help prevent secondary infection until closure could be performed.   Past Medical History  Diagnosis Date  . Restless leg syndrome     takes Lyrica daily  . Arthritis     degenerative lumbar spine, back injection with Dr. Sherene Sires office    . Primary localized osteoarthritis of left knee   . Gout     takes Allopurinol daily  . Constipation     takes Stool Softener daily  . Hypertension     takes Lisinopril-HCTZ daily  . Diabetes mellitus without complication (HCC)     takes Amaryl and Metformin daily  . Complication of anesthesia     woke up very slowly, states she was told that " we had a time waking me you up"  . History of blood transfusion     no abnormal reaction noted  . Weakness     numbness and tingling in both legs  . Joint pain   . Chronic back pain     stenosis  . Urinary frequency   . Nocturia   . Wound dehiscence     Past Surgical History  Procedure Laterality Date  . Tubal ligation    . Abdominal hysterectomy      Partial  . Bladder tacking    . Varicose vein surgery    . Cholecystectomy      [facility].   . Eye surgery      cataracts removed bilateral /w IOL  . 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 Total knee arthroplasty Left 05/19/2014    Procedure: LEFT TOTAL KNEE ARTHROPLASTY;  Surgeon: Nilda Simmer, MD;  Location: [facility] OR;  Service: Orthopedics;  Laterality: Left;  .  Esophagogastroduodenoscopy    . Lumbar laminectomy/decompression microdiscectomy Bilateral 03/12/2015    Procedure: Bilateral Lumbar Three-Four, Lumbar Four-Five Laminectomy and Foraminotomy ;  Surgeon: Tia Alert, MD;  Location: [facility] OR[facility];  Service: Neurosurgery;  Laterality: Bilateral;    Prior to Admission medications   Medication Sig Start Date End Date Taking? Authorizing Provider  allopurinol (ZYLOPRIM) 100 MG tablet Take 100 mg by mouth 2 (two) times daily.   Yes Historical Provider, MD  celecoxib (CELEBREX) 200 MG capsule 1 tab po q day with food for pain and  swelling Patient taking differently: Take 200 mg by mouth daily.  05/21/14  Yes Kirstin Shepperson, PA-C  docusate (COLACE) 50 MG/5ML liquid Take 100 mg by mouth 2 (two) times daily as needed for mild constipation.   Yes Historical Provider, MD  doxycycline (VIBRAMYCIN) 100 MG capsule Take 100 mg by mouth 2 (two) times daily.   Yes Historical Provider, MD  glimepiride (AMARYL) 1 MG tablet Take 1 mg by mouth daily with breakfast.   Yes Historical Provider, MD  lisinopril-hydrochlorothiazide (PRINZIDE,ZESTORETIC) 20-25 MG per tablet Take 1 tablet by mouth daily.   Yes Historical Provider, MD  metFORMIN (GLUCOPHAGE) 500 MG tablet Take  500 mg by mouth 2 (two) times daily with a meal.   Yes Historical Provider, MD  Oxycodone HCl 10 MG TABS Take 10 mg by mouth 5 (five) times daily as needed.   Yes Historical Provider, MD  pregabalin (LYRICA) 150 MG capsule Take 150 mg by mouth 3 (three) times daily.   Yes Historical Provider, MD  rOPINIRole (REQUIP) 2 MG tablet Take 2 mg by mouth at bedtime.   Yes Historical Provider, MD  Cholecalciferol (VITAMIN D) 2000 UNITS CAPS Take 2,000 Units by mouth daily.    Historical Provider, MD  docusate sodium (COLACE) 100 MG capsule 1 tab 2 times a day while on narcotics.  STOOL SOFTENER Patient taking differently: Take 100 mg by mouth 2 (two) times daily as needed for mild constipation. 1 tab 2 times a  day while on narcotics.  STOOL SOFTENER 05/21/14   Kirstin Shepperson, PA-C   Allergies  Allergen Reactions  . Penicillins Hives    Has patient had a PCN reaction causing immediate rash, facial/tongue/throat swelling, SOB or lightheadedness with hypotension: No Has patient had a PCN reaction causing severe rash involving mucus membranes or skin necrosis: No Has patient had a PCN reaction that required hospitalization No Has patient had a PCN reaction occurring within the last 10 years: No If all of the above answers are "NO", then may proceed with Cephalosporin use.     Social History  Substance Use Topics  . Smoking status: Never Smoker   . Smokeless tobacco: Never Used  . Alcohol Use: No    Family History  Problem Relation Age of Onset  . Stroke Mother   . Heart attack Mother     Heart attack while hospitalized with pneumonia at age 34  . Heart disease Father     Vague  . Hypertension Brother   . Diabetes Brother      Review of Systems  Positive ROS: neg  All other systems have been reviewed and were otherwise negative with the exception of those mentioned in the HPI and as above.  Objective: Vital signs in last 24 hours: Temp:  [98.9 F (37.2 C)] 98.9 F (37.2 C) (01/17 0628) Pulse Rate:  [67] 67 (01/17 0628) Resp:  [20] 20 (01/17 0628) BP: (112)/(67) 112/67 mmHg (01/17 0628) SpO2:  [100 %] 100 % (01/17 0628) Weight:  [100.812 kg (222 lb 4 oz)] 100.812 kg (222 lb 4 oz) (01/17 1610)  General Appearance: Alert, cooperative, no distress, appears stated age Head: Normocephalic, without obvious abnormality, atraumatic Eyes: PERRL, conjunctiva/corneas clear, EOM's intact    Neck: Supple, symmetrical, trachea midline Back: Symmetric, no curvature, ROM normal, no CVA tenderness Lungs:  respirations unlabored Heart: Regular rate and rhythm Abdomen: Soft, non-tender Extremities: Extremities normal, atraumatic, no cyanosis or edema Pulses: 2+ and symmetric all  extremities Skin: Skin color, texture, turgor normal, no rashes or lesions  NEUROLOGIC:   Mental status: Alert and oriented x4,  no aphasia, good attention span, fund of knowledge, and memory Motor Exam - grossly normal Sensory Exam - grossly normal Reflexes: 1+ Coordination - grossly normal Gait - grossly normal Balance - grossly normal Cranial Nerves: I: smell Not tested  II: visual acuity  OS: nl    OD: nl  II: visual fields Full to confrontation  II: pupils Equal, round, reactive to light  III,VII: ptosis None  III,IV,VI: extraocular muscles  Full ROM  V: mastication Normal  V: facial light touch sensation  Normal  V,VII: corneal reflex  Present  VII: facial muscle function - upper  Normal  VII: facial muscle function - lower Normal  VIII: hearing Not tested  IX: soft palate elevation  Normal  IX,X: gag reflex Present  XI: trapezius strength  5/5  XI: sternocleidomastoid strength 5/5  XI: neck flexion strength  5/5  XII: tongue strength  Normal    Data Review Lab Results  Component Value Date   WBC 8.0 03/05/2015   HGB 12.9 03/05/2015   HCT 37.4 03/05/2015   MCV 93.5 03/05/2015   PLT 270 03/05/2015   Lab Results  Component Value Date   NA 136 03/05/2015   K 4.4 03/05/2015   CL 103 03/05/2015   CO2 29 03/05/2015   BUN 30* 03/05/2015   CREATININE 0.80 03/05/2015   GLUCOSE 86 03/05/2015   Lab Results  Component Value Date   INR 1.13 03/05/2015    Assessment/Plan: Patient admitted for wound revision. Patient has failed a reasonable attempt at conservative therapy.  I explained the condition and procedure to the patient and answered any questions.  Patient wishes to proceed with procedure as planned. Understands risks/ benefits and typical outcomes of procedure.   JONES,DAVID S 04/21/2015 7:00 AM

## 2015-04-21 NOTE — Anesthesia Postprocedure Evaluation (Signed)
Anesthesia Post Note  Patient: Amber Ashley  Procedure(s) Performed: Procedure(s) (LRB): Lumbar wound revision (N/A)  Patient location during evaluation: PACU Anesthesia Type: General Level of consciousness: awake and oriented Pain management: pain level controlled Vital Signs Assessment: post-procedure vital signs reviewed and stable Respiratory status: spontaneous breathing Cardiovascular status: stable Postop Assessment: no signs of nausea or vomiting Anesthetic complications: yes Anesthetic complication details: injury of skin or soft tissueComments: Has bruise to Left upper eye lid.  This was not evident on arrival to PACU.  No swelling of eye.  tegaderm covers were used on eyes during procedure.  Patient was agitated on emergence and may have hit her eye with her hand.  Do not expect any long term problems with this.    Last Vitals:  Filed Vitals:   04/21/15 0628 04/21/15 0839  BP: 112/67 157/126  Pulse: 67 99  Temp: 37.2 C 36.6 C  Resp: 20 11    Last Pain:  Filed Vitals:   04/21/15 0850  PainSc: 10-Worst pain ever                 Ephram Kornegay

## 2015-04-21 NOTE — Transfer of Care (Signed)
Immediate Anesthesia Transfer of Care Note  Patient: Amber Ashley  Procedure(s) Performed: Procedure(s): Lumbar wound revision (N/A)  Patient Location: PACU  Anesthesia Type:General  Level of Consciousness: awake, oriented and patient cooperative  Airway & Oxygen Therapy: Patient Spontanous Breathing and Patient connected to nasal cannula oxygen  Post-op Assessment: Report given to RN and Post -op Vital signs reviewed and stable  Post vital signs: Reviewed  Last Vitals:  Filed Vitals:   04/21/15 0628 04/21/15 0839  BP: 112/67 157/126  Pulse: 67 99  Temp: 37.2 C 36.6 C  Resp: 20 11    Complications: No apparent anesthesia complications

## 2015-04-21 NOTE — Addendum Note (Signed)
Addendum  created 04/21/15 1205 by Lovie Chol, CRNA   Modules edited: Anesthesia Medication Administration

## 2015-04-21 NOTE — Op Note (Signed)
04/21/2015  8:21 AM  PATIENT:  Amber Ashley  70 y.o. female  PRE-OPERATIVE DIAGNOSIS:  Superficial wound dehiscence  POST-OPERATIVE DIAGNOSIS:  Same  PROCEDURE:  Lumbar wound revision with primary closure  SURGEON:  Marikay Alar, MD  ASSISTANTS: None  ANESTHESIA:   General  EBL: Minimal ml     BLOOD ADMINISTERED:none  DRAINS: None   SPECIMEN:  No Specimen  INDICATION FOR PROCEDURE: This patient underwent a lumbar laminectomy at 2 levels a few weeks ago. She presented with a superficial wound dehiscence. I tried to get her into the wound care clinic for topical treatment and secondary closure. The first available appointment wasn't for several weeks. Therefore recommended primary surgical revision of the wound with primary closure. Patient understood the risks, benefits, and alternatives and potential outcomes and wished to proceed.  PROCEDURE DETAILS: The patient was taken to the operating room and after induction of adequate generalized endotracheal anesthesia she was rolled into the prone position on the Wilson frame and all pressure points were padded. Her lumbar region was cleaned and then prepped with DuraPrep and then draped in usual sterile fashion. Her old incision was ellipsed out down into the subcutaneous tissues. The subcutaneous tissues were very clean. There is no evidence of infection. I did not dissect down to the fascial level. I irrigated with copious amounts of bacitracin containing saline solution and then placed vancomycin powder into the wound. I then closed the subcutaneous tissues with 2-0 Vicryl. As close the subcuticular tissue with 3-0 Vicryl. The skin was closed with benzoin and Steri-Strips. Sterile silver impregnated dressing was applied. The patient was awakened from general anesthesia and transferred to recovery in stable condition. At the end of the procedure all sponge needle and instrument counts were correct.  PLAN OF CARE: Discharge to home  after PACU  PATIENT DISPOSITION:  PACU - hemodynamically stable.   Delay start of Pharmacological VTE agent (>24hrs) due to surgical blood loss or risk of bleeding:  yes

## 2015-04-21 NOTE — Addendum Note (Signed)
Addendum  created 04/21/15 1107 by Lovie Chol, CRNA   Modules edited: Anesthesia Review and Sign Navigator Section, Clinical Notes   Clinical Notes:  File: [id number]

## 2015-04-22 ENCOUNTER — Encounter (HOSPITAL_COMMUNITY): Payer: Self-pay | Admitting: Neurological Surgery

## 2016-05-02 ENCOUNTER — Emergency Department (HOSPITAL_COMMUNITY): Payer: Medicare Other

## 2016-05-02 ENCOUNTER — Emergency Department (HOSPITAL_COMMUNITY)
Admission: EM | Admit: 2016-05-02 | Discharge: 2016-05-02 | Disposition: A | Discharge status: Home / Self Care | Payer: Medicare Other | Attending: Emergency Medicine | Admitting: Emergency Medicine

## 2016-05-02 ENCOUNTER — Encounter (HOSPITAL_COMMUNITY): Payer: Self-pay | Admitting: Neurology

## 2016-05-02 DIAGNOSIS — Z79899 Other long term (current) drug therapy: Secondary | ICD-10-CM | POA: Diagnosis not present

## 2016-05-02 DIAGNOSIS — J181 Lobar pneumonia, unspecified organism: Secondary | ICD-10-CM | POA: Diagnosis not present

## 2016-05-02 DIAGNOSIS — I10 Essential (primary) hypertension: Secondary | ICD-10-CM | POA: Diagnosis not present

## 2016-05-02 DIAGNOSIS — Z7984 Long term (current) use of oral hypoglycemic drugs: Secondary | ICD-10-CM | POA: Insufficient documentation

## 2016-05-02 DIAGNOSIS — Z96652 Presence of left artificial knee joint: Secondary | ICD-10-CM | POA: Diagnosis not present

## 2016-05-02 DIAGNOSIS — R509 Fever, unspecified: Secondary | ICD-10-CM | POA: Diagnosis present

## 2016-05-02 DIAGNOSIS — J189 Pneumonia, unspecified organism: Secondary | ICD-10-CM

## 2016-05-02 DIAGNOSIS — E119 Type 2 diabetes mellitus without complications: Secondary | ICD-10-CM | POA: Diagnosis not present

## 2016-05-02 DIAGNOSIS — J020 Streptococcal pharyngitis: Secondary | ICD-10-CM

## 2016-05-02 LAB — COMPREHENSIVE METABOLIC PANEL
ALK PHOS: 71 U/L (ref 38–126)
ALT: 21 U/L (ref 14–54)
AST: 27 U/L (ref 15–41)
Albumin: 3.5 g/dL (ref 3.5–5.0)
Anion gap: 11 (ref 5–15)
BILIRUBIN TOTAL: 1.3 mg/dL — AB (ref 0.3–1.2)
BUN: 10 mg/dL (ref 6–20)
CALCIUM: 8.8 mg/dL — AB (ref 8.9–10.3)
CO2: 24 mmol/L (ref 22–32)
Chloride: 100 mmol/L — ABNORMAL LOW (ref 101–111)
Creatinine, Ser: 0.84 mg/dL (ref 0.44–1.00)
GFR calc Af Amer: >60 mL/min (ref >60)
GFR calc non Af Amer: >60 mL/min (ref >60)
Glucose, Bld: 188 mg/dL — ABNORMAL HIGH (ref 65–99)
Potassium: 3.7 mmol/L (ref 3.5–5.1)
Sodium: 135 mmol/L (ref 135–145)
TOTAL PROTEIN: 6.8 g/dL (ref 6.5–8.1)

## 2016-05-02 LAB — CBC WITH DIFFERENTIAL/PLATELET
BASOS ABS: 0 10*3/uL (ref 0.0–0.1)
BASOS PCT: 0 %
EOS PCT: 0 %
Eosinophils Absolute: 0 10*3/uL (ref 0.0–0.7)
HCT: 40.4 % (ref 36.0–46.0)
Hemoglobin: 13.5 g/dL (ref 12.0–15.0)
Lymphocytes Relative: 7 %
Lymphs Abs: 1.2 10*3/uL (ref 0.7–4.0)
MCH: 32 pg (ref 26.0–34.0)
MCHC: 33.4 g/dL (ref 30.0–36.0)
MCV: 95.7 fL (ref 78.0–100.0)
MONO ABS: 1 10*3/uL (ref 0.1–1.0)
Monocytes Relative: 5 %
Neutro Abs: 15.9 10*3/uL — ABNORMAL HIGH (ref 1.7–7.7)
Neutrophils Relative %: 88 %
PLATELETS: 216 10*3/uL (ref 150–400)
RBC: 4.22 MIL/uL (ref 3.87–5.11)
RDW: 14.3 % (ref 11.5–15.5)
WBC: 18.1 10*3/uL — ABNORMAL HIGH (ref 4.0–10.5)

## 2016-05-02 LAB — URINALYSIS, ROUTINE W REFLEX MICROSCOPIC
BILIRUBIN URINE: NEGATIVE
GLUCOSE, UA: NEGATIVE mg/dL
KETONES UR: 40 mg/dL — AB
Nitrite: NEGATIVE
PROTEIN: 30 mg/dL — AB
Specific Gravity, Urine: >1.03 — ABNORMAL HIGH (ref 1.005–1.030)
pH: 5.5 (ref 5.0–8.0)

## 2016-05-02 LAB — RAPID STREP SCREEN (MED CTR MEBANE ONLY): Streptococcus, Group A Screen (Direct): POSITIVE — AB

## 2016-05-02 LAB — URINALYSIS, MICROSCOPIC (REFLEX)

## 2016-05-02 LAB — I-STAT CG4 LACTIC ACID, ED: Lactic Acid, Venous: 1.9 mmol/L (ref 0.5–1.9)

## 2016-05-02 MED ORDER — ACETAMINOPHEN 325 MG PO TABS
650.0000 mg | ORAL_TABLET | Freq: Once | ORAL | Status: AC | PRN
  Administered 2016-05-02: 650 mg via ORAL

## 2016-05-02 MED ORDER — DEXTROSE 5 % IV SOLN
500.0000 mg | Freq: Once | INTRAVENOUS | Status: AC
  Administered 2016-05-02: 500 mg via INTRAVENOUS
  Filled 2016-05-02: qty 500

## 2016-05-02 MED ORDER — DEXTROSE 5 % IV SOLN
1.0000 g | Freq: Once | INTRAVENOUS | Status: AC
  Administered 2016-05-02: 1 g via INTRAVENOUS
  Filled 2016-05-02: qty 10

## 2016-05-02 MED ORDER — SODIUM CHLORIDE 0.9 % IV BOLUS (SEPSIS)
1000.0000 mL | Freq: Once | INTRAVENOUS | Status: AC
  Administered 2016-05-02: 1000 mL via INTRAVENOUS

## 2016-05-02 MED ORDER — AZITHROMYCIN 250 MG PO TABS: 250.0000 mg | ORAL_TABLET | Freq: Every day | ORAL | 0 refills | Status: DC

## 2016-05-02 MED ORDER — CEPHALEXIN 500 MG PO CAPS: 500.0000 mg | ORAL_CAPSULE | Freq: Two times a day (BID) | ORAL | 0 refills | Status: DC

## 2016-05-02 MED ORDER — ACETAMINOPHEN 325 MG PO TABS
ORAL_TABLET | ORAL | Status: AC
  Filled 2016-05-02: qty 2

## 2016-05-02 NOTE — Discharge Instructions (Signed)
Make sure you get a new toothbrush.  Also get a probiotic at the drug store to prevent diarrhea

## 2016-05-02 NOTE — ED Provider Notes (Signed)
[facility]-EMERGENCY DEPT Provider Note   CSN: [id number] Arrival date & time: 05/02/16  7829     History   Chief Complaint Chief Complaint  Patient presents with  . Fever  . Sore Throat    HPI Amber Ashley is a 71 y.o. female.  The history is provided by the patient and the spouse.  Fever   This is a new problem. Episode onset: 3 days. The problem occurs constantly. The problem has been gradually worsening. The maximum temperature noted was 101 to 101.9 F. The temperature was taken using an oral thermometer. Associated symptoms include headaches, sore throat, muscle aches and cough. Pertinent negatives include no diarrhea, no vomiting and no congestion. Associated symptoms comments: No shortness of breath, abdominal pain, diarrhea only minimal cough that is nonproductive.. She has tried acetaminophen for the symptoms. The treatment provided no relief.  Sore Throat  Associated symptoms include headaches.    Past Medical History:  Diagnosis Date  . Arthritis    degenerative lumbar spine, back injection with Dr. Sherene Sires office    . Chronic back pain    stenosis  . Complication of anesthesia    woke up very slowly, states she was told that " we had a time waking me you up"  . Constipation    takes Stool Softener daily  . Diabetes mellitus without complication (HCC)    takes Amaryl and Metformin daily  . Gout    takes Allopurinol daily  . History of blood transfusion    no abnormal reaction noted  . Hypertension    takes Lisinopril-HCTZ daily  . Joint pain   . Nocturia   . Primary localized osteoarthritis of left knee   . Restless leg syndrome    takes Lyrica daily  . Urinary frequency   . Weakness    numbness and tingling in both legs  . Wound dehiscence     Patient Active Problem List   Diagnosis Date Noted  . S/P lumbar laminectomy 03/12/2015  . DJD (degenerative joint disease) of knee 05/19/2014  . Precordial chest pain 05/09/2014  . Primary localized  osteoarthritis of left knee   . Lumbar pain with radiation down right leg   . Hypertension   . Diabetes mellitus without complication (HCC)   . Diverticulitis   . Arthritis     Past Surgical History:  Procedure Laterality Date  . ABDOMINAL HYSTERECTOMY     Partial  . Bladder Tacking    . CHOLECYSTECTOMY     [facility].   . ESOPHAGOGASTRODUODENOSCOPY    . EYE SURGERY     cataracts removed bilateral /w IOL  . LUMBAR LAMINECTOMY/DECOMPRESSION MICRODISCECTOMY Bilateral 03/12/2015   Procedure: Bilateral Lumbar Three-Four, Lumbar Four-Five Laminectomy and Foraminotomy ;  Surgeon: Tia Alert, MD;  Location: [facility];  Service: Neurosurgery;  Laterality: Bilateral;  . LUMBAR WOUND DEBRIDEMENT N/A 04/21/2015   Procedure: Lumbar wound revision;  Surgeon: Tia Alert, MD;  Location: [facility];  Service: Neurosurgery;  Laterality: N/A;  . TOTAL KNEE ARTHROPLASTY Left 05/19/2014   dr Thurston Hole  . TOTAL KNEE ARTHROPLASTY Left 05/19/2014   Procedure: LEFT TOTAL KNEE ARTHROPLASTY;  Surgeon: Nilda Simmer, MD;  Location: [facility] OR;  Service: Orthopedics;  Laterality: Left;  . TUBAL LIGATION    . VARICOSE VEIN SURGERY      OB History    No data available       Home Medications    Prior to Admission medications   Medication Sig  Start Date End Date Taking? Authorizing Provider  allopurinol (ZYLOPRIM) 100 MG tablet Take 100 mg by mouth 2 (two) times daily.    Historical Provider, MD  celecoxib (CELEBREX) 200 MG capsule 1 tab po q day with food for pain and  swelling Patient taking differently: Take 200 mg by mouth daily.  05/21/14   Kirstin Shepperson, PA-C  Cholecalciferol (VITAMIN D) 2000 UNITS CAPS Take 2,000 Units by mouth daily.    Historical Provider, MD  docusate (COLACE) 50 MG/5ML liquid Take 100 mg by mouth 2 (two) times daily as needed for mild constipation.    Historical Provider, MD  docusate sodium (COLACE) 100 MG capsule 1 tab 2 times a day while on narcotics.  STOOL  SOFTENER Patient taking differently: Take 100 mg by mouth 2 (two) times daily as needed for mild constipation. 1 tab 2 times a day while on narcotics.  STOOL SOFTENER 05/21/14   Kirstin Shepperson, PA-C  doxycycline (VIBRAMYCIN) 100 MG capsule Take 100 mg by mouth 2 (two) times daily.    Historical Provider, MD  glimepiride (AMARYL) 1 MG tablet Take 1 mg by mouth daily with breakfast.    Historical Provider, MD  lisinopril-hydrochlorothiazide (PRINZIDE,ZESTORETIC) 20-25 MG per tablet Take 1 tablet by mouth daily.    Historical Provider, MD  metFORMIN (GLUCOPHAGE) 500 MG tablet Take 500 mg by mouth 2 (two) times daily with a meal.    Historical Provider, MD  Oxycodone HCl 10 MG TABS Take 10 mg by mouth 5 (five) times daily as needed.    Historical Provider, MD  pregabalin (LYRICA) 150 MG capsule Take 150 mg by mouth 3 (three) times daily.    Historical Provider, MD  rOPINIRole (REQUIP) 2 MG tablet Take 2 mg by mouth at bedtime.    Historical Provider, MD    Family History Family History  Problem Relation Age of Onset  . Stroke Mother   . Heart attack Mother     Heart attack while hospitalized with pneumonia at age 1  . Heart disease Father     Vague  . Hypertension Brother   . Diabetes Brother     Social History Social History  Substance Use Topics  . Smoking status: Never Smoker  . Smokeless tobacco: Never Used  . Alcohol use No     Allergies   Penicillins   Review of Systems Review of Systems  Constitutional: Positive for fever.  HENT: Positive for sore throat. Negative for congestion.   Respiratory: Positive for cough.   Gastrointestinal: Negative for diarrhea and vomiting.  Neurological: Positive for headaches.  All other systems reviewed and are negative.    Physical Exam Updated Vital Signs BP 161/73 (BP Location: Right Arm)   Pulse (!) 126   Temp 100.5 F (38.1 C) (Oral)   Resp 17   Ht 5' 3.5" (1.613 m)   Wt 224 lb (101.6 kg)   SpO2 95%   BMI 39.06 kg/m    Physical Exam  Constitutional: She is oriented to person, place, and time. She appears well-developed and well-nourished. No distress.  HENT:  Head: Normocephalic and atraumatic.  Mouth/Throat: Mucous membranes are dry. Posterior oropharyngeal erythema present.  Eyes: Conjunctivae and EOM are normal. Pupils are equal, round, and reactive to light.  Neck: Normal range of motion. Neck supple.  Cardiovascular: Regular rhythm and intact distal pulses.  Tachycardia present.   No murmur heard. Pulmonary/Chest: Effort normal. No respiratory distress. She has no wheezes. She has rhonchi in the right middle field.  She has no rales.  Abdominal: Soft. She exhibits no distension. There is no tenderness. There is no rebound and no guarding.  Musculoskeletal: Normal range of motion. She exhibits no edema or tenderness.  Neurological: She is alert and oriented to person, place, and time.  Skin: Skin is warm and dry. No rash noted. No erythema.  Psychiatric: She has a normal mood and affect. Her behavior is normal.  Nursing note and vitals reviewed.    ED Treatments / Results  Labs (all labs ordered are listed, but only abnormal results are displayed) Labs Reviewed  RAPID STREP SCREEN (NOT AT [facility]) - Abnormal; Notable for the following:       Result Value   Streptococcus, Group A Screen (Direct) POSITIVE (*)    All other components within normal limits  COMPREHENSIVE METABOLIC PANEL - Abnormal; Notable for the following:    Chloride 100 (*)    Glucose, Bld 188 (*)    Calcium 8.8 (*)    Total Bilirubin 1.3 (*)    All other components within normal limits  CBC WITH DIFFERENTIAL/PLATELET - Abnormal; Notable for the following:    WBC 18.1 (*)    Neutro Abs 15.9 (*)    All other components within normal limits  URINALYSIS, ROUTINE W REFLEX MICROSCOPIC - Abnormal; Notable for the following:    APPearance HAZY (*)    Specific Gravity, Urine >1.030 (*)    Hgb urine dipstick MODERATE (*)     Ketones, ur 40 (*)    Protein, ur 30 (*)    Leukocytes, UA TRACE (*)    All other components within normal limits  URINALYSIS, MICROSCOPIC (REFLEX) - Abnormal; Notable for the following:    Bacteria, UA MANY (*)    Squamous Epithelial / LPF 0-5 (*)    All other components within normal limits  I-STAT CG4 LACTIC ACID, ED  I-STAT CG4 LACTIC ACID, ED    EKG  EKG Interpretation None       Radiology Dg Chest 2 View  Result Date: 05/02/2016 CLINICAL DATA:  Fever.  Body aches. EXAM: CHEST  2 VIEW COMPARISON:  CT 12/22/2015.  Chest x-ray 12/22/2015 FINDINGS: Heart size normal. Right upper lobe infiltrate consistent with pneumonia. No pleural effusion or pneumothorax IMPRESSION: Right upper lobe infiltrate consistent with pneumonia . Electronically Signed   By: Maisie Fus  Register   On: 05/02/2016 09:14    Procedures Procedures (including critical care time)  Medications Ordered in ED Medications  sodium chloride 0.9 % bolus 1,000 mL (not administered)  acetaminophen (TYLENOL) tablet 650 mg (650 mg Oral Given 05/02/16 0846)     Initial Impression / Assessment and Plan / ED Course  I have reviewed the triage vital signs and the nursing notes.  Pertinent labs & imaging results that were available during my care of the patient were reviewed by me and considered in my medical decision making (see chart for details).    Patient is a 71 year old female presenting today with fever, body aches, minimal cough and sore throat. Symptoms of been ongoing for the last 3 days. She denies any shortness of breath. Upon arrival here patient was tachycardic and febrile. Oxygen saturation of 95%. On exam she has mild rhonchi in the right lung fields and erythema of her throat. She is in no acute distress at this time. Sepsis order set was initiated from the waiting room. Patient's lactic acid is normal. White count is 18,000 with a CMP with mild hyperglycemia of 188 but otherwise normal  findings. UA shows  chronic colonization the patient denies any urinary symptoms. Rapid strep pending. On chest x-ray patient has a right upper lobe pneumonia.   10:40 AM Rapid strep is positive.  Will treat with rocephin and azithromycin.  Pt given IVF and first dose of meds IV.  Port Score is 4968 making pt class 2 which is low risk and outpt treatment reasonable.  Vitals:   05/02/16 1230 05/02/16 1259  BP: 111/66   Pulse: 92 84  Resp:    Temp:      Final Clinical Impressions(s) / ED Diagnoses   Final diagnoses:  Strep pharyngitis  Community acquired pneumonia of right upper lobe of lung (HCC)    New Prescriptions New Prescriptions   AZITHROMYCIN (ZITHROMAX) 250 MG TABLET    Take 1 tablet (250 mg total) by mouth daily. Take first 2 tablets together, then 1 every day until finished.   CEPHALEXIN (KEFLEX) 500 MG CAPSULE    Take 1 capsule (500 mg total) by mouth 2 (two) times daily.     Gwyneth SproutWhitney Fedor Kazmierski, MD 05/02/16 1331

## 2016-05-02 NOTE — ED Triage Notes (Signed)
Pt reports fever, body aches, chills, sore throat since Saturday night.

## 2017-05-20 ENCOUNTER — Emergency Department (HOSPITAL_COMMUNITY): Payer: Medicare Other

## 2017-05-20 ENCOUNTER — Encounter (HOSPITAL_COMMUNITY)
Admission: EM | Disposition: A | Discharge status: Home Health | Payer: Self-pay | Source: Home / Self Care | Attending: Internal Medicine

## 2017-05-20 ENCOUNTER — Inpatient Hospital Stay (HOSPITAL_COMMUNITY)
Admission: EM | Admit: 2017-05-20 | Discharge: 2017-05-22 | DRG: 470 | Disposition: A | Discharge status: Home Health | Payer: Medicare Other | Attending: Internal Medicine | Admitting: Internal Medicine

## 2017-05-20 ENCOUNTER — Encounter (HOSPITAL_COMMUNITY): Payer: Self-pay | Admitting: Emergency Medicine

## 2017-05-20 ENCOUNTER — Inpatient Hospital Stay (HOSPITAL_COMMUNITY): Payer: Medicare Other | Admitting: Certified Registered"

## 2017-05-20 ENCOUNTER — Inpatient Hospital Stay (HOSPITAL_COMMUNITY): Payer: Medicare Other

## 2017-05-20 ENCOUNTER — Other Ambulatory Visit: Payer: Self-pay

## 2017-05-20 DIAGNOSIS — E1140 Type 2 diabetes mellitus with diabetic neuropathy, unspecified: Secondary | ICD-10-CM | POA: Diagnosis present

## 2017-05-20 DIAGNOSIS — Z90710 Acquired absence of both cervix and uterus: Secondary | ICD-10-CM

## 2017-05-20 DIAGNOSIS — S7222XA Displaced subtrochanteric fracture of left femur, initial encounter for closed fracture: Secondary | ICD-10-CM | POA: Diagnosis present

## 2017-05-20 DIAGNOSIS — R52 Pain, unspecified: Secondary | ICD-10-CM

## 2017-05-20 DIAGNOSIS — S72042A Displaced fracture of base of neck of left femur, initial encounter for closed fracture: Secondary | ICD-10-CM

## 2017-05-20 DIAGNOSIS — R350 Frequency of micturition: Secondary | ICD-10-CM | POA: Diagnosis not present

## 2017-05-20 DIAGNOSIS — G2581 Restless legs syndrome: Secondary | ICD-10-CM | POA: Diagnosis present

## 2017-05-20 DIAGNOSIS — Z833 Family history of diabetes mellitus: Secondary | ICD-10-CM

## 2017-05-20 DIAGNOSIS — S72012A Unspecified intracapsular fracture of left femur, initial encounter for closed fracture: Principal | ICD-10-CM | POA: Diagnosis present

## 2017-05-20 DIAGNOSIS — S7225XA Nondisplaced subtrochanteric fracture of left femur, initial encounter for closed fracture: Secondary | ICD-10-CM

## 2017-05-20 DIAGNOSIS — I10 Essential (primary) hypertension: Secondary | ICD-10-CM | POA: Diagnosis present

## 2017-05-20 DIAGNOSIS — R0789 Other chest pain: Secondary | ICD-10-CM | POA: Diagnosis not present

## 2017-05-20 DIAGNOSIS — Z880 Allergy status to penicillin: Secondary | ICD-10-CM | POA: Diagnosis not present

## 2017-05-20 DIAGNOSIS — Z09 Encounter for follow-up examination after completed treatment for conditions other than malignant neoplasm: Secondary | ICD-10-CM

## 2017-05-20 DIAGNOSIS — Z7984 Long term (current) use of oral hypoglycemic drugs: Secondary | ICD-10-CM

## 2017-05-20 DIAGNOSIS — Z8739 Personal history of other diseases of the musculoskeletal system and connective tissue: Secondary | ICD-10-CM | POA: Diagnosis not present

## 2017-05-20 DIAGNOSIS — G8929 Other chronic pain: Secondary | ICD-10-CM | POA: Diagnosis present

## 2017-05-20 DIAGNOSIS — Z96652 Presence of left artificial knee joint: Secondary | ICD-10-CM | POA: Diagnosis present

## 2017-05-20 DIAGNOSIS — Z791 Long term (current) use of non-steroidal anti-inflammatories (NSAID): Secondary | ICD-10-CM

## 2017-05-20 DIAGNOSIS — Z79899 Other long term (current) drug therapy: Secondary | ICD-10-CM

## 2017-05-20 DIAGNOSIS — M1990 Unspecified osteoarthritis, unspecified site: Secondary | ICD-10-CM | POA: Diagnosis not present

## 2017-05-20 DIAGNOSIS — Z9889 Other specified postprocedural states: Secondary | ICD-10-CM | POA: Diagnosis not present

## 2017-05-20 DIAGNOSIS — K5792 Diverticulitis of intestine, part unspecified, without perforation or abscess without bleeding: Secondary | ICD-10-CM | POA: Diagnosis not present

## 2017-05-20 DIAGNOSIS — M109 Gout, unspecified: Secondary | ICD-10-CM | POA: Diagnosis present

## 2017-05-20 DIAGNOSIS — W010XXA Fall on same level from slipping, tripping and stumbling without subsequent striking against object, initial encounter: Secondary | ICD-10-CM | POA: Diagnosis present

## 2017-05-20 DIAGNOSIS — Z79890 Hormone replacement therapy (postmenopausal): Secondary | ICD-10-CM | POA: Diagnosis not present

## 2017-05-20 DIAGNOSIS — M80052A Age-related osteoporosis with current pathological fracture, left femur, initial encounter for fracture: Secondary | ICD-10-CM | POA: Diagnosis not present

## 2017-05-20 DIAGNOSIS — M810 Age-related osteoporosis without current pathological fracture: Secondary | ICD-10-CM | POA: Diagnosis present

## 2017-05-20 DIAGNOSIS — W19XXXA Unspecified fall, initial encounter: Secondary | ICD-10-CM

## 2017-05-20 DIAGNOSIS — Z8249 Family history of ischemic heart disease and other diseases of the circulatory system: Secondary | ICD-10-CM

## 2017-05-20 DIAGNOSIS — X58XXXA Exposure to other specified factors, initial encounter: Secondary | ICD-10-CM | POA: Diagnosis not present

## 2017-05-20 HISTORY — DX: Displaced subtrochanteric fracture of left femur, initial encounter for closed fracture: S72.22XA

## 2017-05-20 HISTORY — PX: HIP ARTHROPLASTY: SHX981

## 2017-05-20 LAB — CBC WITH DIFFERENTIAL/PLATELET
BASOS ABS: 0 10*3/uL (ref 0.0–0.1)
Basophils Relative: 0 %
EOS PCT: 0 %
Eosinophils Absolute: 0 10*3/uL (ref 0.0–0.7)
HEMATOCRIT: 41 % (ref 36.0–46.0)
HEMOGLOBIN: 13.5 g/dL (ref 12.0–15.0)
LYMPHS ABS: 1.1 10*3/uL (ref 0.7–4.0)
LYMPHS PCT: 12 %
MCH: 31.5 pg (ref 26.0–34.0)
MCHC: 32.9 g/dL (ref 30.0–36.0)
MCV: 95.8 fL (ref 78.0–100.0)
Monocytes Absolute: 0.5 10*3/uL (ref 0.1–1.0)
Monocytes Relative: 5 %
NEUTROS ABS: 7.4 10*3/uL (ref 1.7–7.7)
NEUTROS PCT: 83 %
Platelets: 177 10*3/uL (ref 150–400)
RBC: 4.28 MIL/uL (ref 3.87–5.11)
RDW: 14.4 % (ref 11.5–15.5)
WBC: 9 10*3/uL (ref 4.0–10.5)

## 2017-05-20 LAB — URINALYSIS, ROUTINE W REFLEX MICROSCOPIC
BACTERIA UA: NONE SEEN
BILIRUBIN URINE: NEGATIVE
Glucose, UA: NEGATIVE mg/dL
KETONES UR: NEGATIVE mg/dL
NITRITE: POSITIVE — AB
PH: 6 (ref 5.0–8.0)
Protein, ur: NEGATIVE mg/dL
RBC / HPF: NONE SEEN RBC/hpf (ref 0–5)
SPECIFIC GRAVITY, URINE: 1.012 (ref 1.005–1.030)

## 2017-05-20 LAB — CBC
HEMATOCRIT: 41.9 % (ref 36.0–46.0)
HEMOGLOBIN: 13.6 g/dL (ref 12.0–15.0)
MCH: 31.6 pg (ref 26.0–34.0)
MCHC: 32.5 g/dL (ref 30.0–36.0)
MCV: 97.4 fL (ref 78.0–100.0)
Platelets: 167 10*3/uL (ref 150–400)
RBC: 4.3 MIL/uL (ref 3.87–5.11)
RDW: 14.8 % (ref 11.5–15.5)
WBC: 8.9 10*3/uL (ref 4.0–10.5)

## 2017-05-20 LAB — PROTIME-INR
INR: 1.11
Prothrombin Time: 14.3 seconds (ref 11.4–15.2)

## 2017-05-20 LAB — TYPE AND SCREEN
ABO/RH(D): B POS
Antibody Screen: NEGATIVE

## 2017-05-20 LAB — GLUCOSE, CAPILLARY
GLUCOSE-CAPILLARY: 147 mg/dL — AB (ref 65–99)
GLUCOSE-CAPILLARY: 165 mg/dL — AB (ref 65–99)

## 2017-05-20 LAB — BASIC METABOLIC PANEL
ANION GAP: 13 (ref 5–15)
BUN: 15 mg/dL (ref 6–20)
CHLORIDE: 96 mmol/L — AB (ref 101–111)
CO2: 25 mmol/L (ref 22–32)
Calcium: 8.9 mg/dL (ref 8.9–10.3)
Creatinine, Ser: 0.81 mg/dL (ref 0.44–1.00)
GFR calc Af Amer: >60 mL/min (ref >60)
GLUCOSE: 137 mg/dL — AB (ref 65–99)
POTASSIUM: 4.1 mmol/L (ref 3.5–5.1)
Sodium: 134 mmol/L — ABNORMAL LOW (ref 135–145)

## 2017-05-20 LAB — CREATININE, SERUM
CREATININE: 0.73 mg/dL (ref 0.44–1.00)
GFR calc Af Amer: >60 mL/min (ref >60)
GFR calc non Af Amer: >60 mL/min (ref >60)

## 2017-05-20 LAB — CBG MONITORING, ED: GLUCOSE-CAPILLARY: 70 mg/dL (ref 65–99)

## 2017-05-20 SURGERY — HEMIARTHROPLASTY, HIP, DIRECT ANTERIOR APPROACH, FOR FRACTURE
Anesthesia: General | Site: Hip | Laterality: Left

## 2017-05-20 MED ORDER — SODIUM CHLORIDE 0.9 % IV SOLN
Freq: Once | INTRAVENOUS | Status: AC
  Administered 2017-05-20: 100 mL/h via INTRAVENOUS

## 2017-05-20 MED ORDER — PHENOL 1.4 % MT LIQD: 1.0000 | OROMUCOSAL | Status: DC | PRN

## 2017-05-20 MED ORDER — PROMETHAZINE HCL 25 MG/ML IJ SOLN: 6.2500 mg | INTRAMUSCULAR | Status: DC | PRN

## 2017-05-20 MED ORDER — MENTHOL 3 MG MT LOZG
1.0000 | LOZENGE | OROMUCOSAL | Status: DC | PRN
Start: 2017-05-20 — End: 2017-05-22

## 2017-05-20 MED ORDER — MIDAZOLAM HCL 2 MG/2ML IJ SOLN
INTRAMUSCULAR | Status: AC
  Filled 2017-05-20: qty 2

## 2017-05-20 MED ORDER — FENTANYL CITRATE (PF) 250 MCG/5ML IJ SOLN
INTRAMUSCULAR | Status: AC
  Filled 2017-05-20: qty 5

## 2017-05-20 MED ORDER — PROPOFOL 10 MG/ML IV BOLUS
INTRAVENOUS | Status: AC
  Filled 2017-05-20: qty 20

## 2017-05-20 MED ORDER — SODIUM CHLORIDE 0.9 % IV SOLN
1.0000 g | Freq: Once | INTRAVENOUS | Status: DC
  Filled 2017-05-20 (×2): qty 10

## 2017-05-20 MED ORDER — PHENYLEPHRINE HCL 10 MG/ML IJ SOLN
INTRAMUSCULAR | Status: DC | PRN
  Administered 2017-05-20: 160 ug via INTRAVENOUS
  Administered 2017-05-20 (×2): 120 ug via INTRAVENOUS

## 2017-05-20 MED ORDER — EPHEDRINE SULFATE 50 MG/ML IJ SOLN
INTRAMUSCULAR | Status: DC | PRN
  Administered 2017-05-20: 10 mg via INTRAVENOUS
  Administered 2017-05-20: 5 mg via INTRAVENOUS

## 2017-05-20 MED ORDER — MEPERIDINE HCL 50 MG/ML IJ SOLN: 6.2500 mg | INTRAMUSCULAR | Status: DC | PRN

## 2017-05-20 MED ORDER — ACETAMINOPHEN 325 MG PO TABS
650.0000 mg | ORAL_TABLET | Freq: Four times a day (QID) | ORAL | Status: DC | PRN
  Administered 2017-05-22: 650 mg via ORAL
  Filled 2017-05-20: qty 2

## 2017-05-20 MED ORDER — VANCOMYCIN HCL IN DEXTROSE 1-5 GM/200ML-% IV SOLN
1000.0000 mg | Freq: Two times a day (BID) | INTRAVENOUS | Status: AC
  Administered 2017-05-21: 1000 mg via INTRAVENOUS
  Filled 2017-05-20: qty 200

## 2017-05-20 MED ORDER — ROCURONIUM BROMIDE 100 MG/10ML IV SOLN
INTRAVENOUS | Status: DC | PRN
  Administered 2017-05-20: 50 mg via INTRAVENOUS
  Administered 2017-05-20: 20 mg via INTRAVENOUS

## 2017-05-20 MED ORDER — ONDANSETRON HCL 4 MG/2ML IJ SOLN
INTRAMUSCULAR | Status: DC | PRN
  Administered 2017-05-20: 4 mg via INTRAVENOUS

## 2017-05-20 MED ORDER — LACTATED RINGERS IV SOLN
INTRAVENOUS | Status: DC | PRN
  Administered 2017-05-20: 18:00:00 via INTRAVENOUS

## 2017-05-20 MED ORDER — STERILE WATER FOR IRRIGATION IR SOLN
Status: DC | PRN
  Administered 2017-05-20: 1000 mL

## 2017-05-20 MED ORDER — VANCOMYCIN HCL 1000 MG IV SOLR
INTRAVENOUS | Status: DC | PRN
  Administered 2017-05-20: 1000 mg via TOPICAL

## 2017-05-20 MED ORDER — SODIUM CHLORIDE 0.9 % IV SOLN
INTRAVENOUS | Status: DC
  Administered 2017-05-20 (×2): via INTRAVENOUS

## 2017-05-20 MED ORDER — PREGABALIN 100 MG PO CAPS
200.0000 mg | ORAL_CAPSULE | Freq: Three times a day (TID) | ORAL | Status: DC
  Administered 2017-05-20 – 2017-05-22 (×4): 200 mg via ORAL
  Filled 2017-05-20 (×5): qty 2

## 2017-05-20 MED ORDER — VANCOMYCIN HCL 1000 MG IV SOLR
INTRAVENOUS | Status: DC | PRN
  Administered 2017-05-20: 1000 mg via INTRAVENOUS

## 2017-05-20 MED ORDER — INSULIN ASPART 100 UNIT/ML ~~LOC~~ SOLN: 0.0000 [IU] | Freq: Every day | SUBCUTANEOUS | Status: DC

## 2017-05-20 MED ORDER — HYDROMORPHONE HCL 1 MG/ML IJ SOLN
INTRAMUSCULAR | Status: AC
  Administered 2017-05-20: 0.5 mg via INTRAVENOUS
  Filled 2017-05-20: qty 1

## 2017-05-20 MED ORDER — CELECOXIB 100 MG PO CAPS
100.0000 mg | ORAL_CAPSULE | Freq: Every day | ORAL | Status: DC
  Administered 2017-05-21 – 2017-05-22 (×2): 100 mg via ORAL
  Filled 2017-05-20 (×3): qty 1

## 2017-05-20 MED ORDER — ACETAMINOPHEN 500 MG PO TABS
1000.0000 mg | ORAL_TABLET | Freq: Four times a day (QID) | ORAL | Status: AC
  Administered 2017-05-20 – 2017-05-21 (×4): 1000 mg via ORAL
  Filled 2017-05-20 (×4): qty 2

## 2017-05-20 MED ORDER — ACETAMINOPHEN 650 MG RE SUPP: 650.0000 mg | Freq: Four times a day (QID) | RECTAL | Status: DC | PRN

## 2017-05-20 MED ORDER — DEXTROSE 5 % IV SOLN
INTRAVENOUS | Status: DC | PRN
  Administered 2017-05-20: 25 ug/min via INTRAVENOUS

## 2017-05-20 MED ORDER — SUGAMMADEX SODIUM 200 MG/2ML IV SOLN
INTRAVENOUS | Status: DC | PRN
  Administered 2017-05-20: 190 mg via INTRAVENOUS

## 2017-05-20 MED ORDER — INSULIN ASPART 100 UNIT/ML ~~LOC~~ SOLN: 0.0000 [IU] | SUBCUTANEOUS | Status: DC

## 2017-05-20 MED ORDER — ONDANSETRON HCL 4 MG/2ML IJ SOLN
INTRAMUSCULAR | Status: AC
Start: 2017-05-20 — End: ?
  Filled 2017-05-20: qty 2

## 2017-05-20 MED ORDER — MORPHINE SULFATE (PF) 2 MG/ML IV SOLN
0.5000 mg | INTRAVENOUS | Status: DC | PRN
  Administered 2017-05-21 (×2): 0.5 mg via INTRAVENOUS
  Filled 2017-05-20 (×2): qty 1

## 2017-05-20 MED ORDER — SENNOSIDES-DOCUSATE SODIUM 8.6-50 MG PO TABS: 1.0000 | ORAL_TABLET | Freq: Every evening | ORAL | Status: DC | PRN

## 2017-05-20 MED ORDER — BISACODYL 5 MG PO TBEC: 5.0000 mg | DELAYED_RELEASE_TABLET | Freq: Every day | ORAL | Status: DC | PRN

## 2017-05-20 MED ORDER — KETOROLAC TROMETHAMINE 30 MG/ML IJ SOLN
INTRAMUSCULAR | Status: AC
  Filled 2017-05-20: qty 1

## 2017-05-20 MED ORDER — VANCOMYCIN HCL IN DEXTROSE 1-5 GM/200ML-% IV SOLN
1000.0000 mg | Freq: Once | INTRAVENOUS | Status: AC
  Administered 2017-05-20: 1000 mg via INTRAVENOUS
  Filled 2017-05-20: qty 200

## 2017-05-20 MED ORDER — SODIUM CHLORIDE 0.9 % IV SOLN
1.0000 g | Freq: Once | INTRAVENOUS | Status: AC
  Administered 2017-05-20: 1 g via INTRAVENOUS
  Filled 2017-05-20: qty 10

## 2017-05-20 MED ORDER — BUPIVACAINE HCL (PF) 0.25 % IJ SOLN
INTRAMUSCULAR | Status: AC
  Filled 2017-05-20: qty 30

## 2017-05-20 MED ORDER — LIDOCAINE HCL (CARDIAC) 20 MG/ML IV SOLN
INTRAVENOUS | Status: DC | PRN
  Administered 2017-05-20: 60 mg via INTRAVENOUS

## 2017-05-20 MED ORDER — DOCUSATE SODIUM 100 MG PO CAPS
100.0000 mg | ORAL_CAPSULE | Freq: Two times a day (BID) | ORAL | Status: DC
  Administered 2017-05-20 – 2017-05-22 (×3): 100 mg via ORAL
  Filled 2017-05-20 (×4): qty 1

## 2017-05-20 MED ORDER — SUGAMMADEX SODIUM 200 MG/2ML IV SOLN
INTRAVENOUS | Status: AC
  Filled 2017-05-20: qty 2

## 2017-05-20 MED ORDER — ENOXAPARIN SODIUM 40 MG/0.4ML ~~LOC~~ SOLN
40.0000 mg | SUBCUTANEOUS | Status: DC
  Administered 2017-05-21 – 2017-05-22 (×2): 40 mg via SUBCUTANEOUS
  Filled 2017-05-20 (×2): qty 0.4

## 2017-05-20 MED ORDER — INSULIN ASPART 100 UNIT/ML ~~LOC~~ SOLN
0.0000 [IU] | Freq: Three times a day (TID) | SUBCUTANEOUS | Status: DC
  Administered 2017-05-21: 3 [IU] via SUBCUTANEOUS
  Administered 2017-05-21: 2 [IU] via SUBCUTANEOUS
  Administered 2017-05-21: 3 [IU] via SUBCUTANEOUS
  Administered 2017-05-22: 1 [IU] via SUBCUTANEOUS

## 2017-05-20 MED ORDER — DEXAMETHASONE SODIUM PHOSPHATE 10 MG/ML IJ SOLN
INTRAMUSCULAR | Status: AC
  Filled 2017-05-20: qty 1

## 2017-05-20 MED ORDER — HYDROMORPHONE HCL 1 MG/ML IJ SOLN
0.5000 mg | Freq: Once | INTRAMUSCULAR | Status: AC
  Administered 2017-05-20: 0.5 mg via INTRAVENOUS
  Filled 2017-05-20: qty 1

## 2017-05-20 MED ORDER — LIDOCAINE 2% (20 MG/ML) 5 ML SYRINGE
INTRAMUSCULAR | Status: AC
  Filled 2017-05-20: qty 5

## 2017-05-20 MED ORDER — LEVOTHYROXINE SODIUM 25 MCG PO TABS
25.0000 ug | ORAL_TABLET | Freq: Every day | ORAL | Status: DC
  Administered 2017-05-21 – 2017-05-22 (×2): 25 ug via ORAL
  Filled 2017-05-20 (×3): qty 1

## 2017-05-20 MED ORDER — ONDANSETRON HCL 4 MG/2ML IJ SOLN: 4.0000 mg | Freq: Four times a day (QID) | INTRAMUSCULAR | Status: DC | PRN

## 2017-05-20 MED ORDER — HYDROMORPHONE HCL 1 MG/ML IJ SOLN
0.2500 mg | INTRAMUSCULAR | Status: DC | PRN
  Administered 2017-05-20 (×4): 0.5 mg via INTRAVENOUS

## 2017-05-20 MED ORDER — VANCOMYCIN HCL IN DEXTROSE 1-5 GM/200ML-% IV SOLN
INTRAVENOUS | Status: AC
  Filled 2017-05-20: qty 200

## 2017-05-20 MED ORDER — DEXAMETHASONE SODIUM PHOSPHATE 10 MG/ML IJ SOLN
INTRAMUSCULAR | Status: DC | PRN
  Administered 2017-05-20: 10 mg via INTRAVENOUS

## 2017-05-20 MED ORDER — FENTANYL CITRATE (PF) 100 MCG/2ML IJ SOLN
INTRAMUSCULAR | Status: DC | PRN
  Administered 2017-05-20: 100 ug via INTRAVENOUS
  Administered 2017-05-20 (×2): 50 ug via INTRAVENOUS

## 2017-05-20 MED ORDER — MORPHINE SULFATE (PF) 4 MG/ML IV SOLN
6.0000 mg | Freq: Once | INTRAVENOUS | Status: AC
  Administered 2017-05-20: 6 mg via INTRAVENOUS
  Filled 2017-05-20: qty 2

## 2017-05-20 MED ORDER — 0.9 % SODIUM CHLORIDE (POUR BTL) OPTIME
TOPICAL | Status: DC | PRN
  Administered 2017-05-20: 1000 mL

## 2017-05-20 MED ORDER — EPHEDRINE 5 MG/ML INJ
INTRAVENOUS | Status: AC
Start: 2017-05-20 — End: ?
  Filled 2017-05-20: qty 10

## 2017-05-20 MED ORDER — TRANEXAMIC ACID 1000 MG/10ML IV SOLN
1000.0000 mg | Freq: Once | INTRAVENOUS | Status: AC
  Administered 2017-05-20: 1000 mg via INTRAVENOUS
  Filled 2017-05-20: qty 10

## 2017-05-20 MED ORDER — ROCURONIUM BROMIDE 10 MG/ML (PF) SYRINGE
PREFILLED_SYRINGE | INTRAVENOUS | Status: AC
  Filled 2017-05-20: qty 5

## 2017-05-20 MED ORDER — ONDANSETRON HCL 4 MG PO TABS: 4.0000 mg | ORAL_TABLET | Freq: Four times a day (QID) | ORAL | Status: DC | PRN

## 2017-05-20 MED ORDER — OXYCODONE HCL 5 MG PO TABS
5.0000 mg | ORAL_TABLET | ORAL | Status: DC | PRN
  Administered 2017-05-21 – 2017-05-22 (×4): 10 mg via ORAL
  Filled 2017-05-20 (×4): qty 2

## 2017-05-20 MED ORDER — ONDANSETRON HCL 4 MG/2ML IJ SOLN
4.0000 mg | Freq: Once | INTRAMUSCULAR | Status: AC
  Administered 2017-05-20: 4 mg via INTRAVENOUS
  Filled 2017-05-20: qty 2

## 2017-05-20 MED ORDER — DICLOFENAC SODIUM 1 % TD GEL
2.0000 g | Freq: Four times a day (QID) | TRANSDERMAL | Status: DC | PRN
  Filled 2017-05-20: qty 100

## 2017-05-20 MED ORDER — VANCOMYCIN HCL 1000 MG IV SOLR
INTRAVENOUS | Status: AC
  Filled 2017-05-20: qty 1000

## 2017-05-20 MED ORDER — PROPOFOL 10 MG/ML IV BOLUS
INTRAVENOUS | Status: DC | PRN
  Administered 2017-05-20: 100 mg via INTRAVENOUS

## 2017-05-20 MED ORDER — MIDAZOLAM HCL 2 MG/2ML IJ SOLN: 0.5000 mg | Freq: Once | INTRAMUSCULAR | Status: DC | PRN

## 2017-05-20 SURGICAL SUPPLY — 54 items
BIT DRILL 7/64X5 DISP (BIT) ×4 IMPLANT
BLADE SAGITTAL 25.0X1.27X90 (BLADE) ×3 IMPLANT
BLADE SAGITTAL 25.0X1.27X90MM (BLADE) ×1
CAPT HIP HEMI 2B ×4 IMPLANT
CLOSURE STERI-STRIP 1/2X4 (GAUZE/BANDAGES/DRESSINGS) ×2
CLOSURE WOUND 1/2 X4 (GAUZE/BANDAGES/DRESSINGS) ×1
CLSR STERI-STRIP ANTIMIC 1/2X4 (GAUZE/BANDAGES/DRESSINGS) ×6 IMPLANT
COVER SURGICAL LIGHT HANDLE (MISCELLANEOUS) ×4 IMPLANT
DRAPE ORTHO SPLIT 77X108 STRL (DRAPES) ×8
DRAPE SURG ORHT 6 SPLT 77X108 (DRAPES) ×4 IMPLANT
DRAPE U-SHAPE 47X51 STRL (DRAPES) ×4 IMPLANT
DRSG AQUACEL AG ADV 3.5X10 (GAUZE/BANDAGES/DRESSINGS) ×3 IMPLANT
DRSG MEPILEX BORDER 4X8 (GAUZE/BANDAGES/DRESSINGS) ×1 IMPLANT
DURAPREP 26ML APPLICATOR (WOUND CARE) ×7 IMPLANT
ELECT BLADE 4.0 EZ CLEAN MEGAD (MISCELLANEOUS) ×4
ELECT CAUTERY BLADE 6.4 (BLADE) ×4 IMPLANT
ELECT REM PT RETURN 9FT ADLT (ELECTROSURGICAL) ×4
ELECTRODE BLDE 4.0 EZ CLN MEGD (MISCELLANEOUS) ×2 IMPLANT
ELECTRODE REM PT RTRN 9FT ADLT (ELECTROSURGICAL) ×2 IMPLANT
FACESHIELD WRAPAROUND (MASK) ×4 IMPLANT
FACESHIELD WRAPAROUND OR TEAM (MASK) ×1 IMPLANT
GLOVE BIO SURGEON STRL SZ7.5 (GLOVE) ×2 IMPLANT
GLOVE BIOGEL PI IND STRL 8 (GLOVE) ×4 IMPLANT
GLOVE BIOGEL PI INDICATOR 8 (GLOVE) ×4
GOWN STRL REUS W/ TWL LRG LVL3 (GOWN DISPOSABLE) ×4 IMPLANT
GOWN STRL REUS W/ TWL XL LVL3 (GOWN DISPOSABLE) ×2 IMPLANT
GOWN STRL REUS W/TWL LRG LVL3 (GOWN DISPOSABLE) ×8
GOWN STRL REUS W/TWL XL LVL3 (GOWN DISPOSABLE) ×4
HIP CAPITATED HEMI 2B ×1 IMPLANT
KIT BASIN OR (CUSTOM PROCEDURE TRAY) ×4 IMPLANT
KIT ROOM TURNOVER OR (KITS) ×4 IMPLANT
MANIFOLD NEPTUNE II (INSTRUMENTS) ×4 IMPLANT
NS IRRIG 1000ML POUR BTL (IV SOLUTION) ×4 IMPLANT
PACK TOTAL JOINT (CUSTOM PROCEDURE TRAY) ×4 IMPLANT
PAD ARMBOARD 7.5X6 YLW CONV (MISCELLANEOUS) ×11 IMPLANT
PILLOW ABDUCTION HIP (SOFTGOODS) ×4 IMPLANT
RETRIEVER SUT HEWSON (MISCELLANEOUS) ×4 IMPLANT
STRIP CLOSURE SKIN 1/2X4 (GAUZE/BANDAGES/DRESSINGS) ×2 IMPLANT
SUT FIBERWIRE #2 38 REV NDL BL (SUTURE) ×8
SUT FIBERWIRE #5 38 CONV NDL (SUTURE) ×12
SUT MNCRL AB 4-0 PS2 18 (SUTURE) ×7 IMPLANT
SUT MON AB 2-0 CT1 36 (SUTURE) ×1 IMPLANT
SUT VIC AB 0 CT1 27 (SUTURE) ×8
SUT VIC AB 0 CT1 27XBRD ANBCTR (SUTURE) ×2 IMPLANT
SUT VIC AB 1 CT1 27 (SUTURE)
SUT VIC AB 1 CT1 27XBRD ANBCTR (SUTURE) ×1 IMPLANT
SUT VIC AB 2-0 CT1 27 (SUTURE) ×12
SUT VIC AB 2-0 CT1 TAPERPNT 27 (SUTURE) ×3 IMPLANT
SUT VLOC 180 0 24IN GS25 (SUTURE) IMPLANT
SUTURE FIBERWR #5 38 CONV NDL (SUTURE) ×3 IMPLANT
SUTURE FIBERWR#2 38 REV NDL BL (SUTURE) ×4 IMPLANT
TOWEL OR 17X24 6PK STRL BLUE (TOWEL DISPOSABLE) ×4 IMPLANT
TOWEL OR 17X26 10 PK STRL BLUE (TOWEL DISPOSABLE) ×4 IMPLANT
TRAY FOLEY CATH SILVER 14FR (SET/KITS/TRAYS/PACK) IMPLANT

## 2017-05-20 NOTE — Anesthesia Procedure Notes (Addendum)
Procedure Name: Intubation Date/Time: 05/20/2017 4:34 PM Performed by: Rosiland OzMeyers, Merinda Victorino, CRNA Pre-anesthesia Checklist: Patient identified, Emergency Drugs available, Suction available, Patient being monitored and Timeout performed Patient Re-evaluated:Patient Re-evaluated prior to induction Oxygen Delivery Method: Circle system utilized Preoxygenation: Pre-oxygenation with 100% oxygen Induction Type: IV induction Ventilation: Mask ventilation without difficulty Laryngoscope Size: Miller and 3 Grade View: Grade I Tube type: Oral Tube size: 7.0 mm Number of attempts: 1 Airway Equipment and Method: Stylet Placement Confirmation: ETT inserted through vocal cords under direct vision,  positive ETCO2 and breath sounds checked- equal and bilateral Secured at: 21 cm Tube secured with: Tape Dental Injury: Teeth and Oropharynx as per pre-operative assessment

## 2017-05-20 NOTE — Transfer of Care (Signed)
Immediate Anesthesia Transfer of Care Note  Patient: Amber Ashley  Procedure(s) Performed: ARTHROPLASTY BIPOLAR HIP (HEMIARTHROPLASTY) (Left Hip)  Patient Location: PACU  Anesthesia Type:General  Level of Consciousness: awake and patient cooperative  Airway & Oxygen Therapy: Patient Spontanous Breathing  Post-op Assessment: Report given to RN and Post -op Vital signs reviewed and stable  Post vital signs: Reviewed and stable  Last Vitals:  Vitals:   05/20/17 1430 05/20/17 1445  BP: (!) 107/54 (!) 105/59  Pulse: 65 76  Resp: 13 11  Temp:    SpO2: 100% 100%    Last Pain:  Vitals:   05/20/17 1332  TempSrc:   PainSc: 4          Complications: No apparent anesthesia complications

## 2017-05-20 NOTE — H&P (Signed)
Date: 05/20/2017               Patient Name:  Amber Ashley MRN: [id number]  DOB: 1945/06/23 Age / Sex: 72 y.o., female   PCP: [name], PA-C         Medical Service: Internal Medicine Teaching Service         Attending Physician: Dr. Inez Catalina, MD    First Contact: Dr. Alinda Money Pager: [phone]  Second Contact: Dr. Nelson Chimes Pager: [phone]       After Hours (After 5p/  First Contact Pager: [phone]  weekends / holidays): Second Contact Pager: [phone]   Chief Complaint: Fall, Hip Pain  History of Present Illness: Mrs Gunnoe is a 72 yo F with a history of Diabetes, HTN, OA, Degenerative Disk Disease (s/p lumbar laminectomy), Arthritis and Diverticulitis who presents with right chest and left hip pain following a fall. Patients states that she got up at 4 am to use the restroom. She reports feeling quite sleepy as a side effect of her Lyrica that she takes for neuropathic pain. She states that she has a habit of falling asleep on the toilet, which occurred again this morning. When she went to get up from the toilet, she noticed her legs feeling numb (presumably from falling asleep on the toilet) and giving out when she stood up. She fell and hit her right chest on the tub. She then crawled out of the bathroom. She states she does not remember standing back up, but she does remember falling a second time, which was when she noticed the onset of her hip pain. She called out for help and was found by her husband. EMS was called and transported the patient to the ED. She endorses right chest wall pain and left hip pain. She denies shortness of breath, fevers, chills, nausea, diarrhea, constipation. She states that she is able to climb 3 flights of stairs and walk significant distance without shortness of breath.  In the ED, patient's vitals significant for soft BP 90s-110s/50s-60s. CBC WNL; BMP showed Na 134, Cl 96; PT/INR WNL. U/A showed nitrites, small leukocytes, and no bacteria. Lumbar  Xray, L knee Xray, CXR, and CT head were without acute findings. Hip Xray showed foreshortening of left femoral neck concerning for fracture. CT L Hip confirmed L femoral neck fracture. Orthopedics consulted in the ED. She received 6mg  Morphine, 4mg  Zofran, and 1g Ceftriaxone, and IVF. Patient to be admitted for further workup and care.   Meds:  Current Meds  Medication Sig  . Cholecalciferol (VITAMIN D) 2000 UNITS CAPS Take 5,000 Units by mouth daily.   . diclofenac sodium (VOLTAREN) 1 % GEL Apply 2 g topically 4 (four) times daily as needed (knee pain).  Marland Kitchen glimepiride (AMARYL) 1 MG tablet Take 1 mg by mouth daily with breakfast.  . levothyroxine (SYNTHROID, LEVOTHROID) 25 MCG tablet Take 25 mcg by mouth daily before breakfast.  . lisinopril-hydrochlorothiazide (PRINZIDE,ZESTORETIC) 20-25 MG per tablet Take 1 tablet by mouth daily.  . meloxicam (MOBIC) 15 MG tablet Take 15 mg by mouth daily.  . metFORMIN (GLUCOPHAGE) 500 MG tablet Take 500 mg by mouth 2 (two) times daily with a meal.  . oxyCODONE (ROXICODONE) 15 MG immediate release tablet Take 15 mg by mouth every 6 (six) hours as needed (pain).   . pregabalin (LYRICA) 200 MG capsule Take 200 mg by mouth 3 (three) times daily.   Marland Kitchen rOPINIRole (REQUIP) 3 MG tablet Take 3 mg by mouth  at bedtime.   . vitamin B-12 (CYANOCOBALAMIN) 1000 MCG tablet Take 1,000 mcg by mouth daily.     Allergies: Allergies as of 05/20/2017 - Review Complete 05/20/2017  Allergen Reaction Noted  . Penicillins Hives 09/12/2013   Past Medical History:  Diagnosis Date  . Arthritis    degenerative lumbar spine, back injection with Dr. Sherene Sires office    . Chronic back pain    stenosis  . Complication of anesthesia    woke up very slowly, states she was told that " we had a time waking me you up"  . Constipation    takes Stool Softener daily  . Diabetes mellitus without complication (HCC)    takes Amaryl and Metformin daily  . Gout    takes Allopurinol daily    . History of blood transfusion    no abnormal reaction noted  . Hypertension    takes Lisinopril-HCTZ daily  . Joint pain   . Nocturia   . Primary localized osteoarthritis of left knee   . Restless leg syndrome    takes Lyrica daily  . Urinary frequency   . Weakness    numbness and tingling in both legs  . Wound dehiscence     Family History: Family History  Problem Relation Age of Onset  . Stroke Mother   . Heart attack Mother        Heart attack while hospitalized with pneumonia at age 67  . Heart disease Father        Vague  . Hypertension Brother   . Diabetes Brother   - Confirmed on admission  Social History: Social History   Tobacco Use  . Smoking status: Never Smoker  . Smokeless tobacco: Never Used  Substance Use Topics  . Alcohol use: No  . Drug use: No  - Confirmed on admission  Also, lives with husband  Review of Systems: A complete ROS was negative except as per HPI.  Physical Exam: Blood pressure (!) 103/49, pulse 60, temperature 98.1 F (36.7 C), temperature source Oral, resp. rate 14, height 5\' 3"  (1.6 m), weight 199 lb (90.3 kg), SpO2 100 %. Physical Exam  Constitutional: She is oriented to person, place, and time. She appears well-developed and well-nourished.  Mildly distressed, tearful, obese female  HENT:  Head: Normocephalic and atraumatic.  Eyes: EOM are normal. Right eye exhibits no discharge. Left eye exhibits no discharge.  Cardiovascular: Normal rate, regular rhythm, normal heart sounds and intact distal pulses.  Pulmonary/Chest: Effort normal and breath sounds normal. No respiratory distress.  Abdominal: Soft. Bowel sounds are normal. She exhibits no distension. There is no tenderness.  Musculoskeletal: She exhibits no edema.  Tenderness to palpation of right chest. Pain in left hip.  Neurological: She is alert and oriented to person, place, and time.  Skin: Skin is warm and dry.    EKG: personally reviewed my interpretation is  NSR @ 79BPM, Baseline artifact.  CXR: personally reviewed my interpretation is Poorly inspired APO film with prominent interstitial markings. No acute findings.  L Hip Xray: IMPRESSION: 1. Apparent foreshortening of the left femoral neck is worrisome for a femoral neck fracture. 2. Osteopenia.  L Knee Xray: IMPRESSION: 1. No definite fracture. System for joint effusion is limited by marked rotation on the lateral view. 2. Osteopenia.  Lumbar Spine Xray: IMPRESSION: 1. No evidence of acute trauma. 2. Thoracolumbar degenerative disc disease.  CT Head: IMPRESSION: Atrophy without acute intracranial abnormality.  CT L Hip: IMPRESSION: Acute, impacted subcapital femoral neck  fracture.  Assessment & Plan by Problem: 72 yo F with a history of Diabetes, HTN, OA, Degenerative Disk Disease (s/p lumbar laminectomy), Arthritis and Diverticulitis who presents with right chest and left hip pain following a fall.  Left Subcapital Femur Neck Fracture: Fracture seen on CT. Occurred during a fall at home. Patient seen by orthopedics in ED with plans to operate today after medical clearance. - ACC/AHA PreOp Revised Cardiac Risk Score: 0 Points. Low Risk (3.9%) of cardiac event. Meets >5 METS. - Appreciate Orthopedics Recommendations - PRN pain control - IV Maintenance fluids - NPO  Chest Wall Pain: Patient with chest pain after hitting her chest on her tub during a fall. No broken ribs on CXR. Will provide supportive care.  HTN: BP soft in ED - Hold home Lisinopril-HCTZ  Diabetes: On metformin and Glimepiride at home. - Continue home Lyrica for diabetic neuropathy - Hold home meds - SSI  FEN: NS 100cc/hr, NPO VTE ppx: SCDs Code Status: FULL   Dispo: Admit patient to Inpatient with expected length of stay greater than 2 midnights.  Signed: Beola CordMelvin, Alexander, MD 05/20/2017, 1:16 PM  Pager: [phone]

## 2017-05-20 NOTE — Consult Note (Signed)
ORTHOPAEDIC CONSULTATION  REQUESTING PHYSICIAN: Duffy Bruce, MD  Chief Complaint: L hip pain  HPI: Amber Ashley is a 72 y.o. female with  Fall resulting in immediate L hip pain.  Patient has had previous L total knee performed by Dr. Noemi Chapel and requested Raliegh Ip participate in her care.  She had no antecedent pain, no previous history of injury to hip.  Had a non-eventful L knee replacement 3 years ago but had a history of infection after spine surgery in the past.  Past Medical History:  Diagnosis Date  . Arthritis    degenerative lumbar spine, back injection with Dr. Archie Endo office    . Chronic back pain    stenosis  . Complication of anesthesia    woke up very slowly, states she was told that " we had a time waking me you up"  . Constipation    takes Stool Softener daily  . Diabetes mellitus without complication ([place])    takes Amaryl and Metformin daily  . Gout    takes Allopurinol daily  . History of blood transfusion    no abnormal reaction noted  . Hypertension    takes Lisinopril-HCTZ daily  . Joint pain   . Nocturia   . Primary localized osteoarthritis of left knee   . Restless leg syndrome    takes Lyrica daily  . Urinary frequency   . Weakness    numbness and tingling in both legs  . Wound dehiscence    Past Surgical History:  Procedure Laterality Date  . ABDOMINAL HYSTERECTOMY     Partial  . Bladder Tacking    . [place].   . ESOPHAGOGASTRODUODENOSCOPY    . EYE SURGERY     cataracts removed bilateral /w IOL  . LUMBAR LAMINECTOMY/DECOMPRESSION MICRODISCECTOMY Bilateral 03/12/2015   Procedure: Bilateral Lumbar Three-Four, Lumbar Four-Five Laminectomy and Foraminotomy ;  Surgeon: Eustace Moore, MD;  Location: [facility];  Service: Neurosurgery;  Laterality: Bilateral;  . LUMBAR WOUND DEBRIDEMENT N/A 04/21/2015   Procedure: Lumbar wound revision;  Surgeon: Eustace Moore, MD;  Location: [facility];  Service:  Neurosurgery;  Laterality: N/A;  . TOTAL KNEE ARTHROPLASTY Left 05/19/2014   dr Noemi Chapel  . TOTAL KNEE ARTHROPLASTY Left 05/19/2014   Procedure: LEFT TOTAL KNEE ARTHROPLASTY;  Surgeon: Lorn Junes, MD;  Location: [facility];  Service: Orthopedics;  Laterality: Left;  . TUBAL LIGATION    . VARICOSE VEIN SURGERY     Social History   Socioeconomic History  . Marital status: Married    Spouse name: None  . Number of children: 4  . Years of education: None  . Highest education level: None  Social Needs  . Financial resource strain: None  . Food insecurity - worry: None  . Food insecurity - inability: None  . Transportation needs - medical: None  . Transportation needs - non-medical: None  Occupational History  . None  Tobacco Use  . Smoking status: Never Smoker  . Smokeless tobacco: Never Used  Substance and Sexual Activity  . Alcohol use: No  . Drug use: No  . Sexual activity: No  Other Topics Concern  . None  Social History Narrative   Lives at home with husband and daughter.     Family History  Problem Relation Age of Onset  . Stroke Mother   . Heart attack Mother        Heart attack while hospitalized with pneumonia  at age 47  . Heart disease Father        Vague  . Hypertension Brother   . Diabetes Brother     Prior to Admission medications   Medication Sig Start Date End Date Taking? Authorizing Provider  Cholecalciferol (VITAMIN D) 2000 UNITS CAPS Take 5,000 Units by mouth daily.    Yes [provider]  diclofenac sodium (VOLTAREN) 1 % GEL Apply 2 g topically 4 (four) times daily as needed (knee pain).   Yes [provider]  glimepiride (AMARYL) 1 MG tablet Take 1 mg by mouth daily with breakfast.   Yes [provider]  levothyroxine (SYNTHROID, LEVOTHROID) 25 MCG tablet Take 25 mcg by mouth daily before breakfast.   Yes [provider]  lisinopril-hydrochlorothiazide (PRINZIDE,ZESTORETIC) 20-25 MG per tablet Take 1 tablet by mouth  daily.   Yes [provider]  meloxicam (MOBIC) 15 MG tablet Take 15 mg by mouth daily.   Yes [provider]  metFORMIN (GLUCOPHAGE) 500 MG tablet Take 500 mg by mouth 2 (two) times daily with a meal.   Yes [provider]  oxyCODONE (ROXICODONE) 15 MG immediate release tablet Take 15 mg by mouth every 6 (six) hours as needed (pain).    Yes [provider]  pregabalin (LYRICA) 200 MG capsule Take 200 mg by mouth 3 (three) times daily.    Yes [provider]  rOPINIRole (REQUIP) 3 MG tablet Take 3 mg by mouth at bedtime.    Yes [provider]  vitamin B-12 (CYANOCOBALAMIN) 1000 MCG tablet Take 1,000 mcg by mouth daily.   Yes [provider]  docusate sodium (COLACE) 100 MG capsule 1 tab 2 times a day while on narcotics.  STOOL SOFTENER Patient not taking: Reported on 05/02/2016 05/21/14   Matthew Saras, PA-C   Dg Chest 1 View  Result Date: 05/20/2017 CLINICAL DATA:  Fall and left hip pain. EXAM: CHEST 1 VIEW COMPARISON:  05/02/2016 FINDINGS: Mild elevation of the right hemidiaphragm. Coarse interstitial lung markings in the right upper lung may represent postinflammatory changes based on the previous pneumonia in this area. Few linear densities in left lower chest may represent atelectasis. No significant airspace disease or consolidation at this time. Slightly decreased lung volumes. Heart size is normal. Mediastinum is prominent but likely secondary to the AP projection and low lung volumes. Trachea is midline. Negative for pneumothorax. Bony thorax appears intact. IMPRESSION: Low lung volumes without acute findings. Chronic changes as described. Electronically Signed   By: Markus Daft M.D.   On: 05/20/2017 08:57   Dg Lumbar Spine Complete  Result Date: 05/20/2017 CLINICAL DATA:  Fall, left hip pain. EXAM: LUMBAR SPINE - COMPLETE 4+ VIEW COMPARISON:  03/12/2015. FINDINGS: Mild dextroconvex curvature, centered at L2-3. Alignment is  otherwise anatomic. Vertebral body height is maintained. Endplate degenerative changes are worst in the lower thoracic and upper lumbar spine. Mild endplate degenerative changes and loss of disc space height at L5-S1. Facet hypertrophy throughout the lumbar spine, worst at L4-5 and L5-S1. No definite pars defects. IMPRESSION: 1. No evidence of acute trauma. 2. Thoracolumbar degenerative disc disease. Electronically Signed   By: Lorin Picket M.D.   On: 05/20/2017 09:02   Ct Head Wo Contrast  Result Date: 05/20/2017 CLINICAL DATA:  Status post fall.  Struck head on bathtub. EXAM: CT HEAD WITHOUT CONTRAST TECHNIQUE: Contiguous axial images were obtained from the base of the skull through the vertex without intravenous contrast. COMPARISON:  12/14/2015 FINDINGS: Brain: There  is no evidence for acute hemorrhage, hydrocephalus, mass lesion, or abnormal extra-axial fluid collection. No definite CT evidence for acute infarction. Diffuse loss of parenchymal volume is consistent with atrophy. Vascular: Atherosclerotic calcification of the carotid siphons noted at the skull base. No dense MCA sign. Skull: No evidence for fracture. No worrisome lytic or sclerotic lesion. Sinuses/Orbits: The visualized paranasal sinuses and mastoid air cells are clear. Visualized portions of the globes and intraorbital fat are unremarkable. Other: None. IMPRESSION: Atrophy without acute intracranial abnormality. Electronically Signed   By: Misty Stanley M.D.   On: 05/20/2017 11:16   Ct Hip Left Wo Contrast  Result Date: 05/20/2017 CLINICAL DATA:  Left hip pain after fall. EXAM: CT OF THE LEFT HIP WITHOUT CONTRAST TECHNIQUE: Multidetector CT imaging of the left hip was performed according to the standard protocol. Multiplanar CT image reconstructions were also generated. COMPARISON:  Left hip x-rays from same day. FINDINGS: Bones/Joint/Cartilage Acute, impacted subcapital femoral neck fracture with slight lateral displacement. No  additional fracture seen. Severe lower lumbar facet arthropathy. Ligaments Suboptimally assessed by CT. Muscles and Tendons No significant muscle atrophy. Visualized tendons are grossly unremarkable. Soft tissues Mild sigmoid diverticulosis. Visualized intrapelvic structures are otherwise unremarkable. IMPRESSION: Acute, impacted subcapital femoral neck fracture. Electronically Signed   By: Titus Dubin M.D.   On: 05/20/2017 11:07   Dg Knee Complete 4 Views Left  Result Date: 05/20/2017 CLINICAL DATA:  Fall, initial encounter. EXAM: LEFT KNEE - COMPLETE 4+ VIEW COMPARISON:  None. FINDINGS: Left knee arthroplasty. Cross-table lateral view is rotated, making evaluation for a joint effusion difficult. Osteopenia. No definite fracture. IMPRESSION: 1. No definite fracture. System for joint effusion is limited by marked rotation on the lateral view. 2. Osteopenia. Electronically Signed   By: Lorin Picket M.D.   On: 05/20/2017 08:54   Dg Hip Unilat With Pelvis 2-3 Views Left  Result Date: 05/20/2017 CLINICAL DATA:  Fall with left hip pain, initial encounter. EXAM: DG HIP (WITH OR WITHOUT PELVIS) 2-3V LEFT COMPARISON:  None. FINDINGS: The left femoral neck appears foreshortened when compared with the right. No additional evidence of an acute fracture. Osteopenia. Degenerative changes in the spine. IMPRESSION: 1. Apparent foreshortening of the left femoral neck is worrisome for a femoral neck fracture. 2. Osteopenia. Electronically Signed   By: Lorin Picket M.D.   On: 05/20/2017 08:53   Family History Reviewed and non-contributory, no pertinent history of problems with bleeding or anesthesia      Review of Systems 14 system ROS conducted and negative except for that noted in HPI   OBJECTIVE  Vitals: Patient Vitals for the past 8 hrs:  BP Temp Temp src Pulse Resp SpO2 Height Weight  05/20/17 1145 (!) 103/49 - - 60 14 100 % - -  05/20/17 1130 (!) 110/51 - - 71 14 100 % - -  05/20/17 1115  129/69 - - 73 - 100 % - -  05/20/17 1100 (!) 100/58 - - 63 - 99 % - -  05/20/17 1045 114/62 - - 62 - 100 % - -  05/20/17 1030 (!) 111/56 - - 64 - 98 % - -  05/20/17 1015 (!) 115/57 - - 74 - 100 % - -  05/20/17 1000 (!) 101/57 - - 76 - 100 % - -  05/20/17 0945 102/73 - - 81 - 100 % - -  05/20/17 0930 (!) 114/56 - - 65 (!) 9 100 % - -  05/20/17 0915 109/70 - - 77 15 100 % - -  05/20/17 0845 110/61 - - 67 13 97 % - -  05/20/17 0815 118/61 - - - 12 - - -  05/20/17 0800 (!) 112/94 - - 80 18 99 % - -  05/20/17 0745 107/61 - - 69 11 99 % - -  05/20/17 0730 118/66 - - 63 15 100 % - -  05/20/17 0715 (!) 115/57 - - 79 17 96 % - -  05/20/17 0711 - - - - - - '5\' 3"'$  (1.6 m) 199 lb (90.3 kg)  05/20/17 0706 117/67 98.1 F (36.7 C) Oral 64 18 98 % - -  05/20/17 0656 - - - - - 96 % - -   General: Alert, no acute distress Cardiovascular: No pedal edema Respiratory: No cyanosis, no use of accessory musculature GI: No organomegaly, abdomen is soft and non-tender Skin: No lesions in the area of chief complaint other than those listed below in MSK exam.  Neurologic: Sensation intact distally save for the below mentioned MSK exam Psychiatric: Patient is competent for consent with normal mood and affect Lymphatic: No axillary or cervical lymphadenopathy Extremities   YQI:[id number] and externally rotated.  ROM deferred. + GS/TA/EHL. Sensation intact in DP/SP/S/S/P distributions. 2+ DP pulse with warm and well perfused digits. Compartments soft and compressible, with no pain on passive stretch.   Rest of the extremities are without swelling, deformity, or effusion. Skin intact.  Nontender to palpation, with full and painless ROM throughout. Neurovascularly intact in all distributions. Compartments soft and compressible.   Test Results Imaging XR and CT reviewed demonstrating moderately displaced L femoral neck fracture, specifically on coronal CT cuts it appears as though there is significant shear and  displacement worrisome for damage to the blood supply to the femoral head.  Labs cbc Recent Labs    05/20/17 0719  WBC 9.0  HGB 13.5  HCT 41.0  PLT 177    Labs inflam No results for input(s): CRP in the last 72 hours.  Invalid input(s): ESR  Labs coag Recent Labs    05/20/17 0719  INR 1.11    Recent Labs    05/20/17 0719  NA 134*  K 4.1  CL 96*  CO2 25  GLUCOSE 137*  BUN 15  CREATININE 0.81  CALCIUM 8.9     ASSESSMENT AND PLAN: 72 y.o. female with the following:  L moderately displaced femoral neck fracture   Non-operative measures are not recommended in this ambulatory patient.  She has reported some previous chest "tightness" but the ER team said EKG was normal.  Pending medical clearance would plan for L hip hemiarthoplasty potentially this afternoon.    The risks benefits and alternatives were discussed with the patient including but not limited to the risks of nonoperative treatment, versus surgical intervention including infection, bleeding, nerve injury, periprosthetic fracture, the need for revision surgery, dislocation, leg length discrepancy, gait change, blood clots, cardiopulmonary complications, morbidity, mortality, among others, and they were willing to proceed.  Specific increased risks for this patient are infection, postoperative pain with potential acetabular OA in the future.   - Weight Bearing Status/Activity: NWB, bedrest  - Additional recommended labs/tests: per medicine  -VTE Prophylaxis: will recommend lovenox postop  - Pain control: prn medicines  - Please keep NPO if we are able to do surgery today.

## 2017-05-20 NOTE — H&P (View-Only) (Signed)
ORTHOPAEDIC CONSULTATION  REQUESTING PHYSICIAN: Duffy Bruce, MD  Chief Complaint: L hip pain  HPI: Amber Ashley is a 72 y.o. female with  Fall resulting in immediate L hip pain.  Patient has had previous L total knee performed by Dr. Noemi Chapel and requested Raliegh Ip participate in her care.  She had no antecedent pain, no previous history of injury to hip.  Had a non-eventful L knee replacement 3 years ago but had a history of infection after spine surgery in the past.  Past Medical History:  Diagnosis Date  . Arthritis    degenerative lumbar spine, back injection with Dr. Archie Endo office    . Chronic back pain    stenosis  . Complication of anesthesia    woke up very slowly, states she was told that " we had a time waking me you up"  . Constipation    takes Stool Softener daily  . Diabetes mellitus without complication ([place])    takes Amaryl and Metformin daily  . Gout    takes Allopurinol daily  . History of blood transfusion    no abnormal reaction noted  . Hypertension    takes Lisinopril-HCTZ daily  . Joint pain   . Nocturia   . Primary localized osteoarthritis of left knee   . Restless leg syndrome    takes Lyrica daily  . Urinary frequency   . Weakness    numbness and tingling in both legs  . Wound dehiscence    Past Surgical History:  Procedure Laterality Date  . ABDOMINAL HYSTERECTOMY     Partial  . Bladder Tacking    . [place].   . ESOPHAGOGASTRODUODENOSCOPY    . EYE SURGERY     cataracts removed bilateral /w IOL  . LUMBAR LAMINECTOMY/DECOMPRESSION MICRODISCECTOMY Bilateral 03/12/2015   Procedure: Bilateral Lumbar Three-Four, Lumbar Four-Five Laminectomy and Foraminotomy ;  Surgeon: Eustace Moore, MD;  Location: [facility] [facility];  Service: Neurosurgery;  Laterality: Bilateral;  . LUMBAR WOUND DEBRIDEMENT N/A 04/21/2015   Procedure: Lumbar wound revision;  Surgeon: Eustace Moore, MD;  Location: [place] [facility];  Service:  Neurosurgery;  Laterality: N/A;  . TOTAL KNEE ARTHROPLASTY Left 05/19/2014   dr Noemi Chapel  . TOTAL KNEE ARTHROPLASTY Left 05/19/2014   Procedure: LEFT TOTAL KNEE ARTHROPLASTY;  Surgeon: Lorn Junes, MD;  Location: [facility];  Service: Orthopedics;  Laterality: Left;  . TUBAL LIGATION    . VARICOSE VEIN SURGERY     Social History   Socioeconomic History  . Marital status: Married    Spouse name: None  . Number of children: 4  . Years of education: None  . Highest education level: None  Social Needs  . Financial resource strain: None  . Food insecurity - worry: None  . Food insecurity - inability: None  . Transportation needs - medical: None  . Transportation needs - non-medical: None  Occupational History  . None  Tobacco Use  . Smoking status: Never Smoker  . Smokeless tobacco: Never Used  Substance and Sexual Activity  . Alcohol use: No  . Drug use: No  . Sexual activity: No  Other Topics Concern  . None  Social History Narrative   Lives at home with husband and daughter.     Family History  Problem Relation Age of Onset  . Stroke Mother   . Heart attack Mother        Heart attack while hospitalized with pneumonia  at age 39  . Heart disease Father        Vague  . Hypertension Brother   . Diabetes Brother     Prior to Admission medications   Medication Sig Start Date End Date Taking? Authorizing Provider  Cholecalciferol (VITAMIN D) 2000 UNITS CAPS Take 5,000 Units by mouth daily.    Yes [provider]  diclofenac sodium (VOLTAREN) 1 % GEL Apply 2 g topically 4 (four) times daily as needed (knee pain).   Yes [provider]  glimepiride (AMARYL) 1 MG tablet Take 1 mg by mouth daily with breakfast.   Yes [provider]  levothyroxine (SYNTHROID, LEVOTHROID) 25 MCG tablet Take 25 mcg by mouth daily before breakfast.   Yes [provider]  lisinopril-hydrochlorothiazide (PRINZIDE,ZESTORETIC) 20-25 MG per tablet Take 1 tablet by mouth  daily.   Yes [provider]  meloxicam (MOBIC) 15 MG tablet Take 15 mg by mouth daily.   Yes [provider]  metFORMIN (GLUCOPHAGE) 500 MG tablet Take 500 mg by mouth 2 (two) times daily with a meal.   Yes [provider]  oxyCODONE (ROXICODONE) 15 MG immediate release tablet Take 15 mg by mouth every 6 (six) hours as needed (pain).    Yes [provider]  pregabalin (LYRICA) 200 MG capsule Take 200 mg by mouth 3 (three) times daily.    Yes [provider]  rOPINIRole (REQUIP) 3 MG tablet Take 3 mg by mouth at bedtime.    Yes [provider]  vitamin B-12 (CYANOCOBALAMIN) 1000 MCG tablet Take 1,000 mcg by mouth daily.   Yes [provider]  docusate sodium (COLACE) 100 MG capsule 1 tab 2 times a day while on narcotics.  STOOL SOFTENER Patient not taking: Reported on 05/02/2016 05/21/14   Matthew Saras, PA-C   Dg Chest 1 View  Result Date: 05/20/2017 CLINICAL DATA:  Fall and left hip pain. EXAM: CHEST 1 VIEW COMPARISON:  05/02/2016 FINDINGS: Mild elevation of the right hemidiaphragm. Coarse interstitial lung markings in the right upper lung may represent postinflammatory changes based on the previous pneumonia in this area. Few linear densities in left lower chest may represent atelectasis. No significant airspace disease or consolidation at this time. Slightly decreased lung volumes. Heart size is normal. Mediastinum is prominent but likely secondary to the AP projection and low lung volumes. Trachea is midline. Negative for pneumothorax. Bony thorax appears intact. IMPRESSION: Low lung volumes without acute findings. Chronic changes as described. Electronically Signed   By: Markus Daft M.D.   On: 05/20/2017 08:57   Dg Lumbar Spine Complete  Result Date: 05/20/2017 CLINICAL DATA:  Fall, left hip pain. EXAM: LUMBAR SPINE - COMPLETE 4+ VIEW COMPARISON:  03/12/2015. FINDINGS: Mild dextroconvex curvature, centered at L2-3. Alignment is  otherwise anatomic. Vertebral body height is maintained. Endplate degenerative changes are worst in the lower thoracic and upper lumbar spine. Mild endplate degenerative changes and loss of disc space height at L5-S1. Facet hypertrophy throughout the lumbar spine, worst at L4-5 and L5-S1. No definite pars defects. IMPRESSION: 1. No evidence of acute trauma. 2. Thoracolumbar degenerative disc disease. Electronically Signed   By: Lorin Picket M.D.   On: 05/20/2017 09:02   Ct Head Wo Contrast  Result Date: 05/20/2017 CLINICAL DATA:  Status post fall.  Struck head on bathtub. EXAM: CT HEAD WITHOUT CONTRAST TECHNIQUE: Contiguous axial images were obtained from the base of the skull through the vertex without intravenous contrast. COMPARISON:  12/14/2015 FINDINGS: Brain: There  is no evidence for acute hemorrhage, hydrocephalus, mass lesion, or abnormal extra-axial fluid collection. No definite CT evidence for acute infarction. Diffuse loss of parenchymal volume is consistent with atrophy. Vascular: Atherosclerotic calcification of the carotid siphons noted at the skull base. No dense MCA sign. Skull: No evidence for fracture. No worrisome lytic or sclerotic lesion. Sinuses/Orbits: The visualized paranasal sinuses and mastoid air cells are clear. Visualized portions of the globes and intraorbital fat are unremarkable. Other: None. IMPRESSION: Atrophy without acute intracranial abnormality. Electronically Signed   By: Misty Stanley M.D.   On: 05/20/2017 11:16   Ct Hip Left Wo Contrast  Result Date: 05/20/2017 CLINICAL DATA:  Left hip pain after fall. EXAM: CT OF THE LEFT HIP WITHOUT CONTRAST TECHNIQUE: Multidetector CT imaging of the left hip was performed according to the standard protocol. Multiplanar CT image reconstructions were also generated. COMPARISON:  Left hip x-rays from same day. FINDINGS: Bones/Joint/Cartilage Acute, impacted subcapital femoral neck fracture with slight lateral displacement. No  additional fracture seen. Severe lower lumbar facet arthropathy. Ligaments Suboptimally assessed by CT. Muscles and Tendons No significant muscle atrophy. Visualized tendons are grossly unremarkable. Soft tissues Mild sigmoid diverticulosis. Visualized intrapelvic structures are otherwise unremarkable. IMPRESSION: Acute, impacted subcapital femoral neck fracture. Electronically Signed   By: Titus Dubin M.D.   On: 05/20/2017 11:07   Dg Knee Complete 4 Views Left  Result Date: 05/20/2017 CLINICAL DATA:  Fall, initial encounter. EXAM: LEFT KNEE - COMPLETE 4+ VIEW COMPARISON:  None. FINDINGS: Left knee arthroplasty. Cross-table lateral view is rotated, making evaluation for a joint effusion difficult. Osteopenia. No definite fracture. IMPRESSION: 1. No definite fracture. System for joint effusion is limited by marked rotation on the lateral view. 2. Osteopenia. Electronically Signed   By: Lorin Picket M.D.   On: 05/20/2017 08:54   Dg Hip Unilat With Pelvis 2-3 Views Left  Result Date: 05/20/2017 CLINICAL DATA:  Fall with left hip pain, initial encounter. EXAM: DG HIP (WITH OR WITHOUT PELVIS) 2-3V LEFT COMPARISON:  None. FINDINGS: The left femoral neck appears foreshortened when compared with the right. No additional evidence of an acute fracture. Osteopenia. Degenerative changes in the spine. IMPRESSION: 1. Apparent foreshortening of the left femoral neck is worrisome for a femoral neck fracture. 2. Osteopenia. Electronically Signed   By: Lorin Picket M.D.   On: 05/20/2017 08:53   Family History Reviewed and non-contributory, no pertinent history of problems with bleeding or anesthesia      Review of Systems 14 system ROS conducted and negative except for that noted in HPI   OBJECTIVE  Vitals: Patient Vitals for the past 8 hrs:  BP Temp Temp src Pulse Resp SpO2 Height Weight  05/20/17 1145 (!) 103/49 - - 60 14 100 % - -  05/20/17 1130 (!) 110/51 - - 71 14 100 % - -  05/20/17 1115  129/69 - - 73 - 100 % - -  05/20/17 1100 (!) 100/58 - - 63 - 99 % - -  05/20/17 1045 114/62 - - 62 - 100 % - -  05/20/17 1030 (!) 111/56 - - 64 - 98 % - -  05/20/17 1015 (!) 115/57 - - 74 - 100 % - -  05/20/17 1000 (!) 101/57 - - 76 - 100 % - -  05/20/17 0945 102/73 - - 81 - 100 % - -  05/20/17 0930 (!) 114/56 - - 65 (!) 9 100 % - -  05/20/17 0915 109/70 - - 77 15 100 % - -  05/20/17 0845 110/61 - - 67 13 97 % - -  05/20/17 0815 118/61 - - - 12 - - -  05/20/17 0800 (!) 112/94 - - 80 18 99 % - -  05/20/17 0745 107/61 - - 69 11 99 % - -  05/20/17 0730 118/66 - - 63 15 100 % - -  05/20/17 0715 (!) 115/57 - - 79 17 96 % - -  05/20/17 0711 - - - - - - '5\' 3"'$  (1.6 m) 199 lb (90.3 kg)  05/20/17 0706 117/67 98.1 F (36.7 C) Oral 64 18 98 % - -  05/20/17 0656 - - - - - 96 % - -   General: Alert, no acute distress Cardiovascular: No pedal edema Respiratory: No cyanosis, no use of accessory musculature GI: No organomegaly, abdomen is soft and non-tender Skin: No lesions in the area of chief complaint other than those listed below in MSK exam.  Neurologic: Sensation intact distally save for the below mentioned MSK exam Psychiatric: Patient is competent for consent with normal mood and affect Lymphatic: No axillary or cervical lymphadenopathy Extremities   OZH:YQMVHQION and externally rotated.  ROM deferred. + GS/TA/EHL. Sensation intact in DP/SP/S/S/P distributions. 2+ DP pulse with warm and well perfused digits. Compartments soft and compressible, with no pain on passive stretch.   Rest of the extremities are without swelling, deformity, or effusion. Skin intact.  Nontender to palpation, with full and painless ROM throughout. Neurovascularly intact in all distributions. Compartments soft and compressible.   Test Results Imaging XR and CT reviewed demonstrating moderately displaced L femoral neck fracture, specifically on coronal CT cuts it appears as though there is significant shear and  displacement worrisome for damage to the blood supply to the femoral head.  Labs cbc Recent Labs    05/20/17 0719  WBC 9.0  HGB 13.5  HCT 41.0  PLT 177    Labs inflam No results for input(s): CRP in the last 72 hours.  Invalid input(s): ESR  Labs coag Recent Labs    05/20/17 0719  INR 1.11    Recent Labs    05/20/17 0719  NA 134*  K 4.1  CL 96*  CO2 25  GLUCOSE 137*  BUN 15  CREATININE 0.81  CALCIUM 8.9     ASSESSMENT AND PLAN: 72 y.o. female with the following:  L moderately displaced femoral neck fracture   Non-operative measures are not recommended in this ambulatory patient.  She has reported some previous chest "tightness" but the ER team said EKG was normal.  Pending medical clearance would plan for L hip hemiarthoplasty potentially this afternoon.    The risks benefits and alternatives were discussed with the patient including but not limited to the risks of nonoperative treatment, versus surgical intervention including infection, bleeding, nerve injury, periprosthetic fracture, the need for revision surgery, dislocation, leg length discrepancy, gait change, blood clots, cardiopulmonary complications, morbidity, mortality, among others, and they were willing to proceed.  Specific increased risks for this patient are infection, postoperative pain with potential acetabular OA in the future.   - Weight Bearing Status/Activity: NWB, bedrest  - Additional recommended labs/tests: per medicine  -VTE Prophylaxis: will recommend lovenox postop  - Pain control: prn medicines  - Please keep NPO if we are able to do surgery today.

## 2017-05-20 NOTE — Anesthesia Postprocedure Evaluation (Signed)
Anesthesia Post Note  Patient: Golden HurterBetty K Sedore  Procedure(s) Performed: ARTHROPLASTY BIPOLAR HIP (HEMIARTHROPLASTY) (Left Hip)     Patient location during evaluation: PACU Anesthesia Type: General Level of consciousness: awake and alert, patient cooperative and oriented Pain management: pain level controlled Vital Signs Assessment: post-procedure vital signs reviewed and stable Respiratory status: spontaneous breathing, nonlabored ventilation, respiratory function stable and patient connected to nasal cannula oxygen Cardiovascular status: blood pressure returned to baseline and stable Postop Assessment: no apparent nausea or vomiting Anesthetic complications: no    Last Vitals:  Vitals:   05/20/17 1945 05/20/17 2008  BP: (!) 134/54 (!) 121/56  Pulse: 82 60  Resp: 12 16  Temp: 36.7 C 36.6 C  SpO2: 98% 96%    Last Pain:  Vitals:   05/20/17 2008  TempSrc: Oral  PainSc:                  Shallon Yaklin,E. Scheryl Sanborn

## 2017-05-20 NOTE — ED Notes (Signed)
Admitting provider made aware of pt CBG 72.

## 2017-05-20 NOTE — Interval H&P Note (Signed)
Discussed case, risks and benefits with patient again.  All questions answered, no change to history.  Amber Zufall MD  

## 2017-05-20 NOTE — ED Notes (Signed)
Pt CBG was 70, notified Jessica(RN)

## 2017-05-20 NOTE — Op Note (Signed)
Orthopaedic Surgery Operative Note (CSN: [id number])  Amber Ashley  March 13, 1946 Date of Surgery: 05/20/2017   Diagnoses:  Left displaced femoral neck fracture  Procedure:    ARTHROPLASTY BIPOLAR HIP (HEMIARTHROPLASTY) CPT(R) Code:  69629 - PR PARTIAL HIP REPLACEMENT     Operative Finding Successful completion of planned procedure.  Good stability to 90, adduction, 60 internal, minimal shuck.    Post-operative plan: The patient will be WBAT with posterior hip precautions.  The patient will be admitted to floor.  DVT prophylaxis with lovenox 40mg  qd until mobilizing well.  Pain control with PRN pain medication preferring oral medicines.  Follow up plan will be scheduled in approximately 10-14 days for wound check and AP pelvis.  Post-Op Diagnosis: Same Surgeons:Primary: Bjorn Pippin, MD Assistants:Brandon Juan Quam [facility] Location: [facility] OR ROOM 03 Anesthesia: General Antibiotics: vancomycin 1g iv, vancomycin 1g powder locally Tourniquet time: * No tourniquets in log * Estimated Blood Loss: 150 Complications: None Specimens: None Implants: Implant Name Type Inv. Item Serial No. Manufacturer Lot No. LRB No. Used Action  STEM HIP 127 DEG - [id number] Stem STEM HIP 127 DEG  STRYKER TRAUMA [id number] Left 1 Implanted  HEAD MODULAR ENDO - [id number] Orthopedic Implant HEAD MODULAR ENDO  [employer] [id number] Left 1 Implanted  SLEEVE UNITRAX V40 - [id number] Orthopedic Implant SLEEVE [employer] [id number] Left 1 Implanted    Indications for Surgery:   Amber Ashley is a 72 y.o. female with fall resulting in above injury.  She has had a history of infection postoperatively after spine surgery.  Benefits and risks of operative and nonoperative management were discussed prior to surgery with patient/guardian(s) and informed consent form was completed.  Specific risks including infection, need for additional surgery, periprosthetic fracture, postop arthritis, limp and continued  pain   Procedure:   The patient was identified in the preoperative holding area where the surgical site was marked. The patient was taken to the OR where a procedural timeout was called and the above noted anesthesia was induced.  The patient was positioned lateral with mark 2 positioner.  Preoperative antibiotics were dosed.  The patient's left hip was prepped and draped in the usual sterile fashion.  A second preoperative timeout was called.      We made an incision centered over the greater trochanter with a scalpel. We used the scalpel to continue to dissect to the fascia. The fascia was pierced with Bovie electrocautery. Mayo scissors were used to cut the fascia in a longitudinal fashion. The gluteus maximus fibers were bluntly split in line with their fibers. The femur was slowly internally rotated, putting tension on the posterior structures. Bovie electrocautery was used to dissect the short external rotators off of the insertion onto the femur. After the short external rotators were transected, we visualized the femoral neck fracture. We identified the sciatic nerve by palpation and verified that it was not in danger from dissection. We made a T-shaped capsulotomy. We assessed and a neck cut was not necessary. We removed the femoral head. We used the box cutter to cut away some of the greater trochanter for ease of insertion of the stem. We then used the canal finder to locate the femoral canal. Using the angle of the femoral neck as our guide for version, we broached sequentially. We used a calcar planer to smooth the neck cut.  We trialed components and found appropriate fit and stability.  The patient would come to full extension of the hip.  The hip was stable at 90 flexion, adduction, 60 internal rotation.  We then removed the trials as well as the broach. We ensured there were no foreign bodies or bone within the acetabulum. We copiously irrigated the wound.  We then carefully placed our stem,  size listed above before assembling the head and neck together onto the stem. We then reduced the hip. Again, that was stable in the previously mentioned manipulations. We repaired the posterior capsule. Short external rotators repaired to the greater trochanter. We closed the fascia of the iliotibial band and gluteus maximus with running and intterupted Vicryl sutures. We then closed Scarpa's fascia with running Vicryl sutures. Skin was closed with monocryl.    The patient was awoken from general anesthesia and taken to the PACU in stable condition without complication.

## 2017-05-20 NOTE — ED Triage Notes (Signed)
Pt BIB EMS from home. Pt fell attempting to get to bathroom, possible multiple falls. Experiencing L hip pain with outward rotation. No obvious deformity. +PMS to LLE. Given 50mcg fentanyl, 4mg  zofran en route by EMS. Hx of HTN, DM, L knee replacement.

## 2017-05-20 NOTE — H&P (Signed)
Date: 05/20/2017               Patient Name:  Amber Ashley MRN: [id number]  DOB: [birth year] Age / Sex: 72 y.o., female   PCP: [name], PA-C              Medical Service: Internal Medicine Teaching Service              Attending Physician: Dr. Inez Catalina, MD    First Contact: Renae Fickle, MS 3 Pager: [phone]  Second Contact: Dr. Alinda Money Pager: [phone]  Third Contact Dr. Nelson Chimes Pager: [phone]       After Hours (After 5p/  First Contact Pager: [phone]  weekends / holidays): Second Contact Pager: [phone]   Chief Complaint: Fall and L hip pain  History of Present Illness:  Amber Ashley is a 72 yo F w/ a PMHx of osteoarthritis, prior L total knee arthroplasty, T2DM w/ diabetic neuropathy, gout, and HTN who presents to the ED with L hip pain after experiencing a fall during the middle of the night. At 0400, she got out of bed to use the restroom and noticed she was feeling drowsy due to the Lyrica she took before bed. She sat on the toilet and fell asleep, when she woke up she noticed numbness and weakness in her legs. Upon standing, she felt as though she could not support herself and fell into the tub on her L breast. Denies syncope or pre-syncope but has some trouble remembering the exact course of events. She crawled to the door and pulled herself up by the handle, at which point she fell again and experienced L hip pain. Prior to the incident, she ambulates without assistance. She is able to ascend 3 flights of stairs without any SOB. She exercises on a stationary bike 3-4 times/week. No other symptoms apart from L hip pain.  In ER, s/p morphine 6 mg, zofran 4 mg, NS IV 100 mL/hr, and 1 g IV Rocephin due to positive nitrites and leukocytes on U/A. CBC and BMP wnl. Normal PT/INR. Glucose- 137. Hip XR showed shortening of the L femoral neck and osteopenia. CT hip revealed acute, impacted subcapital femoral neck fracture. Surgical repair planned for this afternoon.   Meds: Current  Facility-Administered Medications  Medication Dose Route Frequency Provider Last Rate Last Dose  . vancomycin (VANCOCIN) IVPB 1000 mg/200 mL premix  1,000 mg Intravenous Once Bjorn Pippin, MD       Current Outpatient Medications  Medication Sig Dispense Refill  . Cholecalciferol (VITAMIN D) 2000 UNITS CAPS Take 5,000 Units by mouth daily.     . diclofenac sodium (VOLTAREN) 1 % GEL Apply 2 g topically 4 (four) times daily as needed (knee pain).    Marland Kitchen glimepiride (AMARYL) 1 MG tablet Take 1 mg by mouth daily with breakfast.    . levothyroxine (SYNTHROID, LEVOTHROID) 25 MCG tablet Take 25 mcg by mouth daily before breakfast.    . lisinopril-hydrochlorothiazide (PRINZIDE,ZESTORETIC) 20-25 MG per tablet Take 1 tablet by mouth daily.    . meloxicam (MOBIC) 15 MG tablet Take 15 mg by mouth daily.    . metFORMIN (GLUCOPHAGE) 500 MG tablet Take 500 mg by mouth 2 (two) times daily with a meal.    . oxyCODONE (ROXICODONE) 15 MG immediate release tablet Take 15 mg by mouth every 6 (six) hours as needed (pain).     . pregabalin (LYRICA) 200 MG capsule Take 200 mg by mouth 3 (three) times daily.     Marland Kitchen  rOPINIRole (REQUIP) 3 MG tablet Take 3 mg by mouth at bedtime.     . vitamin B-12 (CYANOCOBALAMIN) 1000 MCG tablet Take 1,000 mcg by mouth daily.    Marland Kitchen. docusate sodium (COLACE) 100 MG capsule 1 tab 2 times a day while on narcotics.  STOOL SOFTENER (Patient not taking: Reported on 05/02/2016) 60 capsule 0    Allergies: Allergies as of 05/20/2017 - Review Complete 05/20/2017  Allergen Reaction Noted  . Penicillins Hives 09/12/2013   Past Medical History:  Diagnosis Date  . Arthritis    degenerative lumbar spine, back injection with Dr. Sherene SiresWainer's office    . Chronic back pain    stenosis  . Complication of anesthesia    woke up very slowly, states she was told that " we had a time waking me you up"  . Constipation    takes Stool Softener daily  . Diabetes mellitus without complication (HCC)    takes  Amaryl and Metformin daily  . Gout    takes Allopurinol daily  . History of blood transfusion    no abnormal reaction noted  . Hypertension    takes Lisinopril-HCTZ daily  . Joint pain   . Nocturia   . Primary localized osteoarthritis of left knee   . Restless leg syndrome    takes Lyrica daily  . Urinary frequency   . Weakness    numbness and tingling in both legs  . Wound dehiscence    Past Surgical History:  Procedure Laterality Date  . ABDOMINAL HYSTERECTOMY     Partial  . Bladder Tacking    . [facility]   . ESOPHAGOGASTRODUODENOSCOPY    . EYE SURGERY     cataracts removed bilateral /w IOL  . LUMBAR LAMINECTOMY/DECOMPRESSION MICRODISCECTOMY Bilateral 03/12/2015   Procedure: Bilateral Lumbar Three-Four, Lumbar Four-Five Laminectomy and Foraminotomy ;  Surgeon: Tia Alertavid S Jones, MD;  Location: [facility];  Service: Neurosurgery;  Laterality: Bilateral;  . LUMBAR WOUND DEBRIDEMENT N/A 04/21/2015   Procedure: Lumbar wound revision;  Surgeon: Tia Alertavid S Jones, MD;  Location: [facility];  Service: Neurosurgery;  Laterality: N/A;  . TOTAL KNEE ARTHROPLASTY Left 05/19/2014   dr Thurston Holewainer  . TOTAL KNEE ARTHROPLASTY Left 05/19/2014   Procedure: LEFT TOTAL KNEE ARTHROPLASTY;  Surgeon: Nilda Simmerobert A Wainer, MD;  Location: [facility] OR;  Service: Orthopedics;  Laterality: Left;  . TUBAL LIGATION    . VARICOSE VEIN SURGERY     Family History  Problem Relation Age of Onset  . Stroke Mother   . Heart attack Mother        Heart attack while hospitalized with pneumonia at age 70  . Heart disease Father        Vague  . Hypertension Brother   . Diabetes Brother    Social History   Socioeconomic History  . Marital status: Married    Spouse name: Not on file  . Number of children: 4  . Years of education: Not on file  . Highest education level: Not on file  Social Needs  . Financial resource strain: Not on file  . Food insecurity - worry: Not on file  . Food insecurity -  inability: Not on file  . Transportation needs - medical: Not on file  . Transportation needs - non-medical: Not on file  Occupational History  . Not on file  Tobacco Use  . Smoking status: Never Smoker  . Smokeless tobacco: Never Used  Substance and Sexual Activity  .  Alcohol use: No  . Drug use: No  . Sexual activity: No  Other Topics Concern  . Not on file  Social History Narrative   Lives at home with husband and daughter.      Review of Systems: Constitutional: negative Ears, nose, mouth, throat, and face: negative Respiratory: negative Cardiovascular: negative Gastrointestinal: negative Musculoskeletal:positive for L hip pain Neurological: positive for neuropathic pain in lower extremities  Physical Exam: Blood pressure (!) 103/49, pulse 60, temperature 98.1 F (36.7 C), temperature source Oral, resp. rate 14, height 5\' 3"  (1.6 m), weight 90.3 kg (199 lb), SpO2 100 %. General appearance: alert, cooperative and no distress Head: Normocephalic, without obvious abnormality, atraumatic Eyes: conjunctivae/corneas clear. PERRL, EOM's intact. Fundi benign. Throat: lips, mucosa, and tongue normal; teeth and gums normal and dry mucus membranes Lungs: clear to auscultation bilaterally Heart: regular rate and rhythm, S1, S2 normal, no murmur, click, rub or gallop Abdomen: soft, non-tender; bowel sounds normal; no masses,  no organomegaly Extremities: L hip tenderness, no lower extremity edema Pulses: 2+ and symmetric Skin: Skin color, texture, turgor normal. No rashes or lesions   Lab results:  BMET    Component Value Date/Time   NA 134 (L) 05/20/2017 0719   K 4.1 05/20/2017 0719   CL 96 (L) 05/20/2017 0719   CO2 25 05/20/2017 0719   GLUCOSE 137 (H) 05/20/2017 0719   BUN 15 05/20/2017 0719   CREATININE 0.81 05/20/2017 0719   CALCIUM 8.9 05/20/2017 0719   GFRNONAA >60 05/20/2017 0719   GFRAA >60 05/20/2017 0719   CBC    Component Value Date/Time   WBC 9.0  05/20/2017 0719   RBC 4.28 05/20/2017 0719   HGB 13.5 05/20/2017 0719   HCT 41.0 05/20/2017 0719   PLT 177 05/20/2017 0719   MCV 95.8 05/20/2017 0719   MCH 31.5 05/20/2017 0719   MCHC 32.9 05/20/2017 0719   RDW 14.4 05/20/2017 0719   LYMPHSABS 1.1 05/20/2017 0719   MONOABS 0.5 05/20/2017 0719   EOSABS 0.0 05/20/2017 0719   BASOSABS 0.0 05/20/2017 0719     Imaging results:  Dg Chest 1 View  Result Date: 05/20/2017 CLINICAL DATA:  Fall and left hip pain. EXAM: CHEST 1 VIEW COMPARISON:  05/02/2016 FINDINGS: Mild elevation of the right hemidiaphragm. Coarse interstitial lung markings in the right upper lung may represent postinflammatory changes based on the previous pneumonia in this area. Few linear densities in left lower chest may represent atelectasis. No significant airspace disease or consolidation at this time. Slightly decreased lung volumes. Heart size is normal. Mediastinum is prominent but likely secondary to the AP projection and low lung volumes. Trachea is midline. Negative for pneumothorax. Bony thorax appears intact. IMPRESSION: Low lung volumes without acute findings. Chronic changes as described. Electronically Signed   By: Richarda Overlie M.D.   On: 05/20/2017 08:57   Dg Lumbar Spine Complete  Result Date: 05/20/2017 CLINICAL DATA:  Fall, left hip pain. EXAM: LUMBAR SPINE - COMPLETE 4+ VIEW COMPARISON:  03/12/2015. FINDINGS: Mild dextroconvex curvature, centered at L2-3. Alignment is otherwise anatomic. Vertebral body height is maintained. Endplate degenerative changes are worst in the lower thoracic and upper lumbar spine. Mild endplate degenerative changes and loss of disc space height at L5-S1. Facet hypertrophy throughout the lumbar spine, worst at L4-5 and L5-S1. No definite pars defects. IMPRESSION: 1. No evidence of acute trauma. 2. Thoracolumbar degenerative disc disease. Electronically Signed   By: Leanna Battles M.D.   On: 05/20/2017 09:02   Ct  Head Wo  Contrast  Result Date: 05/20/2017 CLINICAL DATA:  Status post fall.  Struck head on bathtub. EXAM: CT HEAD WITHOUT CONTRAST TECHNIQUE: Contiguous axial images were obtained from the base of the skull through the vertex without intravenous contrast. COMPARISON:  12/14/2015 FINDINGS: Brain: There is no evidence for acute hemorrhage, hydrocephalus, mass lesion, or abnormal extra-axial fluid collection. No definite CT evidence for acute infarction. Diffuse loss of parenchymal volume is consistent with atrophy. Vascular: Atherosclerotic calcification of the carotid siphons noted at the skull base. No dense MCA sign. Skull: No evidence for fracture. No worrisome lytic or sclerotic lesion. Sinuses/Orbits: The visualized paranasal sinuses and mastoid air cells are clear. Visualized portions of the globes and intraorbital fat are unremarkable. Other: None. IMPRESSION: Atrophy without acute intracranial abnormality. Electronically Signed   By: Kennith Center M.D.   On: 05/20/2017 11:16   Ct Hip Left Wo Contrast  Result Date: 05/20/2017 CLINICAL DATA:  Left hip pain after fall. EXAM: CT OF THE LEFT HIP WITHOUT CONTRAST TECHNIQUE: Multidetector CT imaging of the left hip was performed according to the standard protocol. Multiplanar CT image reconstructions were also generated. COMPARISON:  Left hip x-rays from same day. FINDINGS: Bones/Joint/Cartilage Acute, impacted subcapital femoral neck fracture with slight lateral displacement. No additional fracture seen. Severe lower lumbar facet arthropathy. Ligaments Suboptimally assessed by CT. Muscles and Tendons No significant muscle atrophy. Visualized tendons are grossly unremarkable. Soft tissues Mild sigmoid diverticulosis. Visualized intrapelvic structures are otherwise unremarkable. IMPRESSION: Acute, impacted subcapital femoral neck fracture. Electronically Signed   By: Obie Dredge M.D.   On: 05/20/2017 11:07   Dg Knee Complete 4 Views Left  Result Date:  05/20/2017 CLINICAL DATA:  Fall, initial encounter. EXAM: LEFT KNEE - COMPLETE 4+ VIEW COMPARISON:  None. FINDINGS: Left knee arthroplasty. Cross-table lateral view is rotated, making evaluation for a joint effusion difficult. Osteopenia. No definite fracture. IMPRESSION: 1. No definite fracture. System for joint effusion is limited by marked rotation on the lateral view. 2. Osteopenia. Electronically Signed   By: Leanna Battles M.D.   On: 05/20/2017 08:54   Dg Hip Unilat With Pelvis 2-3 Views Left  Result Date: 05/20/2017 CLINICAL DATA:  Fall with left hip pain, initial encounter. EXAM: DG HIP (WITH OR WITHOUT PELVIS) 2-3V LEFT COMPARISON:  None. FINDINGS: The left femoral neck appears foreshortened when compared with the right. No additional evidence of an acute fracture. Osteopenia. Degenerative changes in the spine. IMPRESSION: 1. Apparent foreshortening of the left femoral neck is worrisome for a femoral neck fracture. 2. Osteopenia. Electronically Signed   By: Leanna Battles M.D.   On: 05/20/2017 08:53    Other results: EKG: Normal sinus rate and rhythm, no significant change from past EKG  Assessment & Plan by Problem: Active Problems:   Closed left subtrochanteric femur fracture (HCC)  Shilpa Bushee is a 71 yo F w/ a PMHx of osteoarthritis, prior L total knee arthroplasty, T2DM w/ diabetic neuropathy, gout, and HTN who presents to the ED with L hip pain after a fall this morning and CT findings of acute, impacted L subcapital femoral neck fracture.  Acute, impacted L subcapital femoral neck fracture: Pt is cleared for surgical repair this afternoon. According to [facility] guidelines for preop revised cardiac risk, she scored 0 pts, which correlates to a 3.9% risk of cardiac events, which is low. She meets more than 5 mets of activity.  - Pain management until operative procedure with Morphine 6 mg IV - NS IV fluids @  100 mL/hr - Acetominophen 650 mg prn for pain - Zofran 4mg  PO  prn  nausea - Senokot, Dulcolax for bowel regimen  T2DM w/ diabetic neuropathy: Gluc- 137 - Novolog SSI - Lyrica 200 mg PO tid for neuropathic pain management - Hold home metformin  Osteoarthritis: - Voltaren gel as needed to affected areas  HTN: BP on admission 117/67 - Hold home lisinopril-HCTZ given surgery this afternoon and BP wnl   This is a Psychologist, occupational Note.  The care of the patient was discussed with Dr. Alinda Money and the assessment and plan was formulated with their assistance.  Please see their note for official documentation of the patient encounter.   Signed: Henriette Combs, Medical Student 05/20/2017, 1:04 PM

## 2017-05-20 NOTE — Anesthesia Preprocedure Evaluation (Addendum)
Anesthesia Evaluation  Patient identified by MRN, date of birth, ID band Patient awake    Reviewed: Allergy & Precautions, NPO status , Patient's Chart, lab work & pertinent test results  History of Anesthesia Complications (+) PROLONGED EMERGENCE  Airway Mallampati: II  TM Distance: >3 FB Neck ROM: Full    Dental  (+) Missing, Dental Advisory Given   Pulmonary neg pulmonary ROS,    breath sounds clear to auscultation       Cardiovascular hypertension, Pt. on medications (-) angina Rhythm:Regular Rate:Normal     Neuro/Psych negative neurological ROS     GI/Hepatic negative GI ROS, Neg liver ROS,   Endo/Other  diabetes (glu 70), Oral Hypoglycemic AgentsHypothyroidism   Renal/GU negative Renal ROS     Musculoskeletal  (+) Arthritis , Osteoarthritis,    Abdominal (+) + obese,   Peds  Hematology negative hematology ROS (+)   Anesthesia Other Findings   Reproductive/Obstetrics                            Anesthesia Physical Anesthesia Plan  ASA: III  Anesthesia Plan: General   Post-op Pain Management:    Induction: Intravenous  PONV Risk Score and Plan: 4 or greater and Ondansetron, Dexamethasone and Treatment may vary due to age or medical condition  Airway Management Planned: Oral ETT  Additional Equipment:   Intra-op Plan:   Post-operative Plan: Extubation in OR  Informed Consent: I have reviewed the patients History and Physical, chart, labs and discussed the procedure including the risks, benefits and alternatives for the proposed anesthesia with the patient or authorized representative who has indicated his/her understanding and acceptance.   Dental advisory given  Plan Discussed with: CRNA and Surgeon  Anesthesia Plan Comments: (Plan routine monitors, GETA)        Anesthesia Quick Evaluation

## 2017-05-20 NOTE — ED Provider Notes (Signed)
[facility] EMERGENCY DEPARTMENT Provider Note   CSN: [id number] Arrival date & time: 05/20/17  4098     History   Chief Complaint Chief Complaint  Patient presents with  . Fall  . Hip Pain    HPI Amber Ashley is a 72 y.o. female.  HPI   72 year old female with past medical history as below including chronic pain secondary to degenerative disc disease, diabetes, hypertension, here with left hip pain and right chest wall pain after fall.  The patient states that she has a history of difficulty staying awake and some falls after taking her Lyrica at night.  She woke up this morning to go the restroom.  She is able to walk to the bathroom, use the restroom, then tripped and fell while walking back.  She states she does not believe she lost consciousness, and feels like it was just that she was "out of it" due to her Lyrica.  Denies any chest pain or shortness of breath.  She fell and hit her right breast and chest wall on the side of the bathtub, then landed on her left hip.  She reports severe left hip pain that is worse with any kind of movement.  Denies any numbness or weakness in lower extremity.  She has exquisite pain with any attempted range of motion.  She also reports mild right-sided chest pain.  This is worse with direct palpation and deep inspiration.  Denies any shortness of breath.  She was well prior to the fall.  No recent other medication changes.  Past Medical History:  Diagnosis Date  . Arthritis    degenerative lumbar spine, back injection with Dr. Sherene Sires office    . Chronic back pain    stenosis  . Complication of anesthesia    woke up very slowly, states she was told that " we had a time waking me you up"  . Constipation    takes Stool Softener daily  . Diabetes mellitus without complication (HCC)    takes Amaryl and Metformin daily  . Gout    takes Allopurinol daily  . History of blood transfusion    no abnormal reaction noted  .  Hypertension    takes Lisinopril-HCTZ daily  . Joint pain   . Nocturia   . Primary localized osteoarthritis of left knee   . Restless leg syndrome    takes Lyrica daily  . Urinary frequency   . Weakness    numbness and tingling in both legs  . Wound dehiscence     Patient Active Problem List   Diagnosis Date Noted  . S/P lumbar laminectomy 03/12/2015  . DJD (degenerative joint disease) of knee 05/19/2014  . Precordial chest pain 05/09/2014  . Primary localized osteoarthritis of left knee   . Lumbar pain with radiation down right leg   . Hypertension   . Diabetes mellitus without complication (HCC)   . Diverticulitis   . Arthritis     Past Surgical History:  Procedure Laterality Date  . ABDOMINAL HYSTERECTOMY     Partial  . Bladder Tacking    . CHOLECYSTECTOMY     [facility].   . ESOPHAGOGASTRODUODENOSCOPY    . EYE SURGERY     cataracts removed bilateral /w IOL  . LUMBAR LAMINECTOMY/DECOMPRESSION MICRODISCECTOMY Bilateral 03/12/2015   Procedure: Bilateral Lumbar Three-Four, Lumbar Four-Five Laminectomy and Foraminotomy ;  Surgeon: Tia Alert, MD;  Location: [facility] NEURO ORS;  Service: Neurosurgery;  Laterality: Bilateral;  . LUMBAR  WOUND DEBRIDEMENT N/A 04/21/2015   Procedure: Lumbar wound revision;  Surgeon: Tia Alertavid S Jones, MD;  Location: [facility] NEURO ORS;  Service: Neurosurgery;  Laterality: N/A;  . TOTAL KNEE ARTHROPLASTY Left 05/19/2014   dr Thurston Holewainer  . TOTAL KNEE ARTHROPLASTY Left 05/19/2014   Procedure: LEFT TOTAL KNEE ARTHROPLASTY;  Surgeon: Nilda Simmerobert A Wainer, MD;  Location: [facility] OR;  Service: Orthopedics;  Laterality: Left;  . TUBAL LIGATION    . VARICOSE VEIN SURGERY      OB History    No data available       Home Medications    Prior to Admission medications   Medication Sig Start Date End Date Taking? Authorizing Provider  Cholecalciferol (VITAMIN D) 2000 UNITS CAPS Take 5,000 Units by mouth daily.    Yes [provider]  diclofenac sodium (VOLTAREN) 1  % GEL Apply 2 g topically 4 (four) times daily as needed (knee pain).   Yes [provider]  glimepiride (AMARYL) 1 MG tablet Take 1 mg by mouth daily with breakfast.   Yes [provider]  levothyroxine (SYNTHROID, LEVOTHROID) 25 MCG tablet Take 25 mcg by mouth daily before breakfast.   Yes [provider]  lisinopril-hydrochlorothiazide (PRINZIDE,ZESTORETIC) 20-25 MG per tablet Take 1 tablet by mouth daily.   Yes [provider]  meloxicam (MOBIC) 15 MG tablet Take 15 mg by mouth daily.   Yes [provider]  metFORMIN (GLUCOPHAGE) 500 MG tablet Take 500 mg by mouth 2 (two) times daily with a meal.   Yes [provider]  oxyCODONE (ROXICODONE) 15 MG immediate release tablet Take 15 mg by mouth every 6 (six) hours as needed (pain).    Yes [provider]  pregabalin (LYRICA) 200 MG capsule Take 200 mg by mouth 3 (three) times daily.    Yes [provider]  rOPINIRole (REQUIP) 3 MG tablet Take 3 mg by mouth at bedtime.    Yes [provider]  vitamin B-12 (CYANOCOBALAMIN) 1000 MCG tablet Take 1,000 mcg by mouth daily.   Yes [provider]  docusate sodium (COLACE) 100 MG capsule 1 tab 2 times a day while on narcotics.  STOOL SOFTENER Patient not taking: Reported on 05/02/2016 05/21/14   Julien GirtShepperson, Kirstin, PA-C    Family History Family History  Problem Relation Age of Onset  . Stroke Mother   . Heart attack Mother        Heart attack while hospitalized with pneumonia at age 70  . Heart disease Father        Vague  . Hypertension Brother   . Diabetes Brother     Social History Social History   Tobacco Use  . Smoking status: Never Smoker  . Smokeless tobacco: Never Used  Substance Use Topics  . Alcohol use: No  . Drug use: No     Allergies   Penicillins   Review of Systems Review of Systems  Constitutional: Negative for chills and fever.  HENT: Negative for congestion, rhinorrhea and  sore throat.   Eyes: Negative for visual disturbance.  Respiratory: Negative for cough, shortness of breath and wheezing.   Cardiovascular: Negative for chest pain and leg swelling.  Gastrointestinal: Negative for abdominal pain, diarrhea, nausea and vomiting.  Genitourinary: Negative for dysuria, flank pain, vaginal bleeding and vaginal discharge.  Musculoskeletal: Positive for arthralgias, gait problem and myalgias. Negative for neck pain.  Skin: Negative for rash.  Allergic/Immunologic: Negative for immunocompromised state.  Neurological: Negative for syncope and headaches.  Hematological: Does  not bruise/bleed easily.  All other systems reviewed and are negative.    Physical Exam Updated Vital Signs BP (!) 110/51   Pulse 71   Temp 98.1 F (36.7 C) (Oral)   Resp 14   Ht 5\' 3"  (1.6 m)   Wt 90.3 kg (199 lb)   SpO2 100%   BMI 35.25 kg/m   Physical Exam  Constitutional: She is oriented to person, place, and time. She appears well-developed and well-nourished. No distress.  HENT:  Head: Normocephalic and atraumatic.  Eyes: Conjunctivae are normal.  Neck: Neck supple.  Cardiovascular: Normal rate, regular rhythm and normal heart sounds. Exam reveals no friction rub.  No murmur heard. Pulmonary/Chest: Effort normal and breath sounds normal. No respiratory distress. She has no wheezes. She has no rales. She exhibits tenderness (Tenderness to palpation over the right outer breast.  No bruising or deformity.  Mild tenderness over the lateral right chest wall.  Normal chest wall excursion.  Normal work of breathing with clear breath sounds.).  Abdominal: She exhibits no distension.  Musculoskeletal: She exhibits no edema.  Neurological: She is alert and oriented to person, place, and time. She exhibits normal muscle tone.  Skin: Skin is warm. Capillary refill takes less than 2 seconds.  Psychiatric: She has a normal mood and affect.  Nursing note and vitals reviewed.   LOWER  EXTREMITY EXAM: Left  INSPECTION & PALPATION: Left leg slightly shortened and externally rotated.  Market tenderness over the anterior pelvis and lateral hip.  No bruising.  No open wounds.  SENSORY: sensation is intact to light touch in:  Superficial peroneal nerve distribution (over dorsum of foot) Deep peroneal nerve distribution (over first dorsal web space) Sural nerve distribution (over lateral aspect 5th metatarsal) Saphenous nerve distribution (over medial instep)  MOTOR:  + Motor EHL (great toe dorsiflexion) + FHL (great toe plantar flexion)  + TA (ankle dorsiflexion)  + GSC (ankle plantar flexion)  VASCULAR: 2+ dorsalis pedis and posterior tibialis pulses Capillary refill < 2 sec, toes warm and well-perfused  COMPARTMENTS: Soft, warm, well-perfused No pain with passive extension No parethesias    ED Treatments / Results  Labs (all labs ordered are listed, but only abnormal results are displayed) Labs Reviewed  BASIC METABOLIC PANEL - Abnormal; Notable for the following components:      Result Value   Sodium 134 (*)    Chloride 96 (*)    Glucose, Bld 137 (*)    All other components within normal limits  CBC WITH DIFFERENTIAL/PLATELET  PROTIME-INR  URINALYSIS, ROUTINE W REFLEX MICROSCOPIC  TYPE AND SCREEN    EKG  EKG Interpretation  Date/Time:  Saturday May 20 2017 07:03:31 EST Ventricular Rate:  79 PR Interval:    QRS Duration: 105 QT Interval:  375 QTC Calculation: 436 R Axis:   -73 Text Interpretation:  Sinus rhythm Short PR interval Left axis deviation RSR' in V1 or V2, probably normal variant No significant change since last tracing Baseline artifact Confirmed by Shaune Pollack [phone]) on 05/20/2017 7:21:31 AM       Radiology Dg Chest 1 View  Result Date: 05/20/2017 CLINICAL DATA:  Fall and left hip pain. EXAM: CHEST 1 VIEW COMPARISON:  05/02/2016 FINDINGS: Mild elevation of the right hemidiaphragm. Coarse interstitial lung markings in  the right upper lung may represent postinflammatory changes based on the previous pneumonia in this area. Few linear densities in left lower chest may represent atelectasis. No significant airspace disease or consolidation at this time. Slightly  decreased lung volumes. Heart size is normal. Mediastinum is prominent but likely secondary to the AP projection and low lung volumes. Trachea is midline. Negative for pneumothorax. Bony thorax appears intact. IMPRESSION: Low lung volumes without acute findings. Chronic changes as described. Electronically Signed   By: Richarda Overlie M.D.   On: 05/20/2017 08:57   Dg Lumbar Spine Complete  Result Date: 05/20/2017 CLINICAL DATA:  Fall, left hip pain. EXAM: LUMBAR SPINE - COMPLETE 4+ VIEW COMPARISON:  03/12/2015. FINDINGS: Mild dextroconvex curvature, centered at L2-3. Alignment is otherwise anatomic. Vertebral body height is maintained. Endplate degenerative changes are worst in the lower thoracic and upper lumbar spine. Mild endplate degenerative changes and loss of disc space height at L5-S1. Facet hypertrophy throughout the lumbar spine, worst at L4-5 and L5-S1. No definite pars defects. IMPRESSION: 1. No evidence of acute trauma. 2. Thoracolumbar degenerative disc disease. Electronically Signed   By: Leanna Battles M.D.   On: 05/20/2017 09:02   Ct Head Wo Contrast  Result Date: 05/20/2017 CLINICAL DATA:  Status post fall.  Struck head on bathtub. EXAM: CT HEAD WITHOUT CONTRAST TECHNIQUE: Contiguous axial images were obtained from the base of the skull through the vertex without intravenous contrast. COMPARISON:  12/14/2015 FINDINGS: Brain: There is no evidence for acute hemorrhage, hydrocephalus, mass lesion, or abnormal extra-axial fluid collection. No definite CT evidence for acute infarction. Diffuse loss of parenchymal volume is consistent with atrophy. Vascular: Atherosclerotic calcification of the carotid siphons noted at the skull base. No dense MCA sign.  Skull: No evidence for fracture. No worrisome lytic or sclerotic lesion. Sinuses/Orbits: The visualized paranasal sinuses and mastoid air cells are clear. Visualized portions of the globes and intraorbital fat are unremarkable. Other: None. IMPRESSION: Atrophy without acute intracranial abnormality. Electronically Signed   By: Kennith Center M.D.   On: 05/20/2017 11:16   Ct Hip Left Wo Contrast  Result Date: 05/20/2017 CLINICAL DATA:  Left hip pain after fall. EXAM: CT OF THE LEFT HIP WITHOUT CONTRAST TECHNIQUE: Multidetector CT imaging of the left hip was performed according to the standard protocol. Multiplanar CT image reconstructions were also generated. COMPARISON:  Left hip x-rays from same day. FINDINGS: Bones/Joint/Cartilage Acute, impacted subcapital femoral neck fracture with slight lateral displacement. No additional fracture seen. Severe lower lumbar facet arthropathy. Ligaments Suboptimally assessed by CT. Muscles and Tendons No significant muscle atrophy. Visualized tendons are grossly unremarkable. Soft tissues Mild sigmoid diverticulosis. Visualized intrapelvic structures are otherwise unremarkable. IMPRESSION: Acute, impacted subcapital femoral neck fracture. Electronically Signed   By: Obie Dredge M.D.   On: 05/20/2017 11:07   Dg Knee Complete 4 Views Left  Result Date: 05/20/2017 CLINICAL DATA:  Fall, initial encounter. EXAM: LEFT KNEE - COMPLETE 4+ VIEW COMPARISON:  None. FINDINGS: Left knee arthroplasty. Cross-table lateral view is rotated, making evaluation for a joint effusion difficult. Osteopenia. No definite fracture. IMPRESSION: 1. No definite fracture. System for joint effusion is limited by marked rotation on the lateral view. 2. Osteopenia. Electronically Signed   By: Leanna Battles M.D.   On: 05/20/2017 08:54   Dg Hip Unilat With Pelvis 2-3 Views Left  Result Date: 05/20/2017 CLINICAL DATA:  Fall with left hip pain, initial encounter. EXAM: DG HIP (WITH OR WITHOUT  PELVIS) 2-3V LEFT COMPARISON:  None. FINDINGS: The left femoral neck appears foreshortened when compared with the right. No additional evidence of an acute fracture. Osteopenia. Degenerative changes in the spine. IMPRESSION: 1. Apparent foreshortening of the left femoral neck is worrisome for a  femoral neck fracture. 2. Osteopenia. Electronically Signed   By: Leanna Battles M.D.   On: 05/20/2017 08:53    Procedures Procedures (including critical care time)  Medications Ordered in ED Medications  morphine 4 MG/ML injection 6 mg (6 mg Intravenous Given 05/20/17 0731)  ondansetron (ZOFRAN) injection 4 mg (4 mg Intravenous Given 05/20/17 0729)  0.9 %  sodium chloride infusion (100 mL/hr Intravenous New Bag/Given 05/20/17 0729)     Initial Impression / Assessment and Plan / ED Course  I have reviewed the triage vital signs and the nursing notes.  Pertinent labs & imaging results that were available during my care of the patient were reviewed by me and considered in my medical decision making (see chart for details).     72 yo F with PMHx chronic pain, obesity, h/o L TKR by Dr. Thurston Hole here with L hip pain, R chest wall pain s/p likley mechanical fall. Not on blood thinners. No apparent head trauma. Will check plain films, give fluids and analgesia. No abd TTP or signs or intra-abdominal trauma, and VSS.  Plain films with possible femur fracture.  CT scan subsequently obtained and shows subcapital left femur fracture.  Patient will be admitted to medicine with orthopedic consulting.  Discussed with Delbert Harness on-call, who may be able to take the patient to OR today.  Patient will need to be medically cleared by medicine team prior to this.  She is otherwise hemodynamically stable in the ED.  Final Clinical Impressions(s) / ED Diagnoses   Final diagnoses:  Fall, initial encounter  Closed displaced basicervical fracture of left femur, initial encounter [facility])    ED Discharge Orders    None        Shaune Pollack, MD 05/20/17 1149

## 2017-05-21 ENCOUNTER — Other Ambulatory Visit: Payer: Self-pay

## 2017-05-21 DIAGNOSIS — W19XXXA Unspecified fall, initial encounter: Secondary | ICD-10-CM

## 2017-05-21 DIAGNOSIS — G2581 Restless legs syndrome: Secondary | ICD-10-CM

## 2017-05-21 DIAGNOSIS — K5792 Diverticulitis of intestine, part unspecified, without perforation or abscess without bleeding: Secondary | ICD-10-CM

## 2017-05-21 DIAGNOSIS — Z9889 Other specified postprocedural states: Secondary | ICD-10-CM

## 2017-05-21 DIAGNOSIS — E1140 Type 2 diabetes mellitus with diabetic neuropathy, unspecified: Secondary | ICD-10-CM

## 2017-05-21 DIAGNOSIS — M80052A Age-related osteoporosis with current pathological fracture, left femur, initial encounter for fracture: Secondary | ICD-10-CM

## 2017-05-21 DIAGNOSIS — R350 Frequency of micturition: Secondary | ICD-10-CM

## 2017-05-21 DIAGNOSIS — Z79899 Other long term (current) drug therapy: Secondary | ICD-10-CM

## 2017-05-21 DIAGNOSIS — I10 Essential (primary) hypertension: Secondary | ICD-10-CM

## 2017-05-21 DIAGNOSIS — Z8739 Personal history of other diseases of the musculoskeletal system and connective tissue: Secondary | ICD-10-CM

## 2017-05-21 DIAGNOSIS — Z7984 Long term (current) use of oral hypoglycemic drugs: Secondary | ICD-10-CM

## 2017-05-21 DIAGNOSIS — R0789 Other chest pain: Secondary | ICD-10-CM

## 2017-05-21 DIAGNOSIS — S72042A Displaced fracture of base of neck of left femur, initial encounter for closed fracture: Secondary | ICD-10-CM

## 2017-05-21 DIAGNOSIS — M1990 Unspecified osteoarthritis, unspecified site: Secondary | ICD-10-CM

## 2017-05-21 LAB — BASIC METABOLIC PANEL
ANION GAP: 9 (ref 5–15)
BUN: 7 mg/dL (ref 6–20)
CHLORIDE: 106 mmol/L (ref 101–111)
CO2: 24 mmol/L (ref 22–32)
Calcium: 8.2 mg/dL — ABNORMAL LOW (ref 8.9–10.3)
Creatinine, Ser: 0.63 mg/dL (ref 0.44–1.00)
GFR calc non Af Amer: >60 mL/min (ref >60)
GLUCOSE: 196 mg/dL — AB (ref 65–99)
Potassium: 3.8 mmol/L (ref 3.5–5.1)
Sodium: 139 mmol/L (ref 135–145)

## 2017-05-21 LAB — URINALYSIS, ROUTINE W REFLEX MICROSCOPIC
Bilirubin Urine: NEGATIVE
Glucose, UA: 50 mg/dL — AB
Hgb urine dipstick: NEGATIVE
Ketones, ur: 5 mg/dL — AB
Nitrite: NEGATIVE
PH: 6 (ref 5.0–8.0)
Protein, ur: NEGATIVE mg/dL
SPECIFIC GRAVITY, URINE: 1.013 (ref 1.005–1.030)

## 2017-05-21 LAB — GLUCOSE, CAPILLARY
GLUCOSE-CAPILLARY: 170 mg/dL — AB (ref 65–99)
GLUCOSE-CAPILLARY: 202 mg/dL — AB (ref 65–99)
GLUCOSE-CAPILLARY: 135 mg/dL — AB (ref 65–99)
Glucose-Capillary: 204 mg/dL — ABNORMAL HIGH (ref 65–99)

## 2017-05-21 LAB — CBC
HCT: 36.7 % (ref 36.0–46.0)
Hemoglobin: 12 g/dL (ref 12.0–15.0)
MCH: 31.4 pg (ref 26.0–34.0)
MCHC: 32.7 g/dL (ref 30.0–36.0)
MCV: 96.1 fL (ref 78.0–100.0)
Platelets: 168 10*3/uL (ref 150–400)
RBC: 3.82 MIL/uL — AB (ref 3.87–5.11)
RDW: 14.7 % (ref 11.5–15.5)
WBC: 7.5 10*3/uL (ref 4.0–10.5)

## 2017-05-21 LAB — SURGICAL PCR SCREEN
MRSA, PCR: NEGATIVE
STAPHYLOCOCCUS AUREUS: NEGATIVE

## 2017-05-21 MED ORDER — ROPINIROLE HCL 1 MG PO TABS
3.0000 mg | ORAL_TABLET | Freq: Every day | ORAL | Status: DC
  Administered 2017-05-21: 3 mg via ORAL
  Filled 2017-05-21: qty 3

## 2017-05-21 NOTE — Plan of Care (Signed)
  Progressing Health Behavior/Discharge Planning: Ability to manage health-related needs will improve 05/21/2017 1113 - Progressing by Quentin CornwallMadison, Ziyonna Christner, RN Clinical Measurements: Ability to maintain clinical measurements within normal limits will improve 05/21/2017 1113 - Progressing by Quentin CornwallMadison, Leanore Biggers, RN Will remain free from infection 05/21/2017 1113 - Progressing by Quentin CornwallMadison, Dekker Verga, RN Diagnostic test results will improve 05/21/2017 1113 - Progressing by Quentin CornwallMadison, Jamont Mellin, RN Respiratory complications will improve 05/21/2017 1113 - Progressing by Quentin CornwallMadison, Jiayi Lengacher, RN Cardiovascular complication will be avoided 05/21/2017 1113 - Progressing by Quentin CornwallMadison, Kash Mothershead, RN Activity: Risk for activity intolerance will decrease 05/21/2017 1113 - Progressing by Quentin CornwallMadison, Dominica Kent, RN Nutrition: Adequate nutrition will be maintained 05/21/2017 1113 - Progressing by Quentin CornwallMadison, Lashonne Shull, RN Coping: Level of anxiety will decrease 05/21/2017 1113 - Progressing by Quentin CornwallMadison, Marajade Lei, RN Elimination: Will not experience complications related to bowel motility 05/21/2017 1113 - Progressing by Quentin CornwallMadison, Esther Bradstreet, RN Will not experience complications related to urinary retention 05/21/2017 1113 - Progressing by Quentin CornwallMadison, Elliotte Marsalis, RN Pain Managment: General experience of comfort will improve 05/21/2017 1113 - Progressing by Quentin CornwallMadison, Shazia Mitchener, RN Safety: Ability to remain free from injury will improve 05/21/2017 1113 - Progressing by Quentin CornwallMadison, Crist Kruszka, RN Skin Integrity: Risk for impaired skin integrity will decrease 05/21/2017 1113 - Progressing by Quentin CornwallMadison, Ariyel Jeangilles, RN Education: Knowledge of the prescribed therapeutic regimen will improve 05/21/2017 1113 - Progressing by Quentin CornwallMadison, Camisha Srey, RN Understanding of discharge needs will improve 05/21/2017 1113 - Progressing by Quentin CornwallMadison, Jeniffer Culliver, RN Activity: Ability to avoid complications of mobility impairment will improve 05/21/2017 1113 - Progressing by Quentin CornwallMadison, Joie Reamer, RN Ability to  tolerate increased activity will improve 05/21/2017 1113 - Progressing by Quentin CornwallMadison, Mayce Noyes, RN Clinical Measurements: Postoperative complications will be avoided or minimized 05/21/2017 1113 - Progressing by Quentin CornwallMadison, Kale Rondeau, RN Pain Management: Pain level will decrease with appropriate interventions 05/21/2017 1113 - Progressing by Quentin CornwallMadison, Wen Munford, RN Skin Integrity: Signs of wound healing will improve 05/21/2017 1113 - Progressing by Quentin CornwallMadison, Brok Stocking, RN

## 2017-05-21 NOTE — Progress Notes (Addendum)
   Subjective: [name] was seen resting in her bed this morning with daughter at bedside. She states that she is feeling well this morning aside from some restless leg syndrome in her right leg. She states that her post operative pain is well controlled on her current regimen. She denies chest pain or shortness of breath. She states that she has noticed some odor to her urine lately, but denies increased frequency or dysuria. She states she has never had a DEXA or bone scan done and she does not know of a family hisotry of osteoporosis. She was informed that with a fracture from a standing height fall she meets criteria for osteo porosis and will need a DEXA scan and will be started on a bisphosphonate. No further questions or complaints.  Objective:  Vital signs in last 24 hours: Vitals:   05/20/17 2200 05/20/17 2300 05/21/17 0015 05/21/17 0612  BP: 125/61 131/64 (!) 110/59 (!) 98/56  Pulse: 64 61 60 (!) 53  Resp: 16 15 15 12   Temp:   97.6 F (36.4 C) 98.2 F (36.8 C)  TempSrc:   Axillary Oral  SpO2: 99% 100% 98% 98%  Weight:      Height:       Physical Exam  Constitutional: She is oriented to person, place, and time. She appears well-developed and well-nourished.  HENT:  Head: Normocephalic and atraumatic.  Eyes: EOM are normal. Right eye exhibits no discharge. Left eye exhibits no discharge.  Cardiovascular: Normal rate, regular rhythm, normal heart sounds and intact distal pulses.  Pulmonary/Chest: Effort normal and breath sounds normal. No respiratory distress.  Abdominal: Soft. Bowel sounds are normal. She exhibits no distension. There is no tenderness.  Musculoskeletal: She exhibits no edema or deformity.  Clean dry dressing in place at left hip  Neurological: She is alert and oriented to person, place, and time.  Skin: Skin is warm and dry.    Assessment/Plan: 72 yo F with a history of Diabetes, HTN, OA, Degenerative Disk Disease (s/p lumbar laminectomy), Arthritis and  Diverticulitis who presents with right chest and left hip pain following a fall.  Osteoporosis Left Subcapital Femur Neck Fracture: Fracture verifired on CT. Occurred during a fall at home. S/P surgical fixation 2/16. Patient with clinical diagnosis of osteoporosis with this fragility fracture. Will need to initiate bisphosphonate therapy 30 days from the date of surgery. - Appreciate Orthopedics Recommendations - Morphine 2mg  IV q2h, PRN Severe Pain - Oxycodone 5-10mg  q4h, PRN Moderate Pain - Acetaminophen 1,000mg  q6h - PT/OT Eval and Treat - SW Consult for Rehab Facility - Pulmonary Toilet  ?UTI:  Patient received 1 dose of ceftriaxone in ED for U/A showing leukocytes and nitrites, but no bacteria. Patient has noticed some odor to her urine lately, but denies increased frequency or dysuria. - Repeat U/A  HTN: BP hypo to normotensive this admission. Receiving IV morphine. - Hold home Lisinopril-HCTZ  Diabetes: On metformin and Glimepiride at home. - Continue home Lyrica for diabetic neuropathy - Hold home meds - SSI  Chest Wall Pain: Resolved. Patient with chest pain after hitting her chest on her tub during a fall. No broken ribs on CXR. Will provide supportive care.  FEN: Regular Diet VTE ppx: Lovenox, SCDs Code Status: FULL   Dispo: Anticipated discharge in approximately 1-2 day(s).   Beola CordMelvin, Alexander, MD 05/21/2017, 10:35 AM Pager: [phone]

## 2017-05-21 NOTE — Progress Notes (Signed)
ORTHOPAEDIC PROGRESS NOTE  s/p Procedure(s): ARTHROPLASTY UNIPOLAR L HIP (HEMIARTHROPLASTY)  SUBJECTIVE: Reports mild pain about operative site. No chest pain. No SOB. No nausea/vomiting. No other complaints.  OBJECTIVE: PE: LLE: incision CDI, abduction pillow in place, leg lengths equal, warm well perfused foot, intact EHL/TA/GSC  Vitals:   05/21/17 0612 05/21/17 1424  BP: (!) 98/56 (!) 107/59  Pulse: (!) 53 66  Resp: 12 16  Temp: 98.2 F (36.8 C) 98 F (36.7 C)  SpO2: 98% 94%     ASSESSMENT: Amber Ashley is a 72 y.o. female doing well postoperatively.  PLAN: Weightbearing: WBAT LLE, posterior hip precautions Insicional and dressing care: Dressings left intact until follow-up Orthopedic device(s): abduction pillow while in bed Showering: ok to shower with aquacel in place VTE prophylaxis: Lovenox 40mg  qd 2 weeks Pain control: prn medications. Follow - up plan: 1 week Contact information:  Weekdays 8-5 Ramond Marrowax Raidon Swanner MD [phone], After hours and holidays please check [email or web address] for group call information for [facility]

## 2017-05-21 NOTE — Evaluation (Signed)
Physical Therapy Evaluation Patient Details Name: Amber Ashley MRN: [id number] DOB: 08/04/1945 Today's Date: 05/21/2017   History of Present Illness  Admitted after fall resulting in L hip fracture; now s/p L hip hemiarthroplasty, Post Prec, WBAT;  has a past medical history of Arthritis, Chronic back pain, Complication of anesthesia, Constipation, Diabetes mellitus without complication (HCC), Gout, History of blood transfusion, Hypertension, Joint pain, Nocturia, Primary localized osteoarthritis of left knee, Restless leg syndrome, Urinary frequency, Weakness, and Wound dehiscence.  Clinical Impression   Patient is s/p above surgery resulting in functional limitations due to the deficits listed below (see PT Problem List). Independent prior to admission; Husband with recent heart surgery; daughter is available to help; Overall moving well, and I anticipate being able to dc home with family assist;  Patient will benefit from skilled PT to increase their independence and safety with mobility to allow discharge to the venue listed below.       Follow Up Recommendations Home health PT;Supervision/Assistance - 24 hour    Equipment Recommendations  None recommended by PT(pretty well-equipped)    Recommendations for Other Services OT consult(as ordered)     Precautions / Restrictions Precautions Precautions: Posterior Hip Precaution Booklet Issued: Yes (comment) Precaution Comments: Educated pt in Posterior Precautions Restrictions LLE Weight Bearing: Weight bearing as tolerated      Mobility  Bed Mobility Overal bed mobility: Needs Assistance Bed Mobility: Supine to Sit     Supine to sit: Min guard;HOB elevated     General bed mobility comments: Cues for technqiue; close guard for posterio precautions  Transfers Overall transfer level: Needs assistance Equipment used: Rolling walker (2 wheeled) Transfers: Sit to/from Stand Sit to Stand: Min guard         General  transfer comment: Cues for precautions, hand placment, and safety  Ambulation/Gait Ambulation/Gait assistance: Min guard Ambulation Distance (Feet): 60 Feet Assistive device: Rolling walker (2 wheeled) Gait Pattern/deviations: Decreased step length - right;Decreased stance time - left Gait velocity: slowed Gait velocity interpretation: Below normal speed for age/gender General Gait Details: Cues for post precautions with turns, especially; cues also to self-monitor for activity tolernace  Stairs            Wheelchair Mobility    Modified Rankin (Stroke Patients Only)       Balance                                             Pertinent Vitals/Pain Pain Assessment: 0-10 Pain Score: 3  Pain Location: L hip Pain Descriptors / Indicators: Aching;Operative site guarding Pain Intervention(s): Monitored during session    Home Living Family/patient expects to be discharged to:: Private residence Living Arrangements: Spouse/significant other Available Help at Discharge: Family;Available 24 hours/day Type of Home: House Home Access: Stairs to enter Entrance Stairs-Rails: Left Entrance Stairs-Number of Steps: 3 Home Layout: One level Home Equipment: Walker - 2 wheels;Bedside commode      Prior Function Level of Independence: Independent               Hand Dominance        Extremity/Trunk Assessment   Upper Extremity Assessment Upper Extremity Assessment: Defer to OT evaluation    Lower Extremity Assessment Lower Extremity Assessment: LLE deficits/detail LLE Deficits / Details: Grossly decr AROM and strength L hip postoperatively       Communication   Communication: No  difficulties  Cognition Arousal/Alertness: Awake/alert Behavior During Therapy: WFL for tasks assessed/performed Overall Cognitive Status: Within Functional Limits for tasks assessed                                        General Comments       Exercises Total Joint Exercises Quad Sets: AROM;Left;5 reps Gluteal Sets: AROM;Both;5 reps Towel Squeeze: AROM;Both;5 reps Heel Slides: AAROM;Left;5 reps Hip ABduction/ADduction: AAROM;Left;5 reps   Assessment/Plan    PT Assessment Patient needs continued PT services  PT Problem List Decreased strength;Decreased range of motion;Decreased activity tolerance;Decreased balance;Decreased mobility;Decreased knowledge of use of DME;Decreased safety awareness;Decreased knowledge of precautions;Pain       PT Treatment Interventions DME instruction;Gait training;Stair training;Functional mobility training;Therapeutic activities;Therapeutic exercise;Balance training;Patient/family education    PT Goals (Current goals can be found in the Care Plan section)  Acute Rehab PT Goals Patient Stated Goal: home PT Goal Formulation: With patient Time For Goal Achievement: 05/28/17 Potential to Achieve Goals: Good    Frequency Min 6X/week   Barriers to discharge        Co-evaluation               AM-PAC PT "6 Clicks" Daily Activity  Outcome Measure Difficulty turning over in bed (including adjusting bedclothes, sheets and blankets)?: A Lot Difficulty moving from lying on back to sitting on the side of the bed? : A Little Difficulty sitting down on and standing up from a chair with arms (e.g., wheelchair, bedside commode, etc,.)?: A Little Help needed moving to and from a bed to chair (including a wheelchair)?: A Little Help needed walking in hospital room?: A Little Help needed climbing 3-5 steps with a railing? : A Lot 6 Click Score: 16    End of Session Equipment Utilized During Treatment: Gait belt Activity Tolerance: Patient tolerated treatment well Patient left: with call bell/phone within reach;Other (comment)(in bathroom) Nurse Communication: Mobility status PT Visit Diagnosis: Other abnormalities of gait and mobility (R26.89);Pain Pain - Right/Left: Left Pain - part of  body: Hip    Time: 1142-1220 PT Time Calculation (min) (ACUTE ONLY): 38 min   Charges:   PT Evaluation $PT Eval Low Complexity: 1 Low PT Treatments $Gait Training: 8-22 mins $Therapeutic Activity: 8-22 mins   PT G Codes:        Van Clines, PT  Acute Rehabilitation Services Pager [phone] Office [phone]   Levi Aland 05/21/2017, 1:12 PM

## 2017-05-21 NOTE — Progress Notes (Signed)
  Date: 05/21/2017  Patient name: Amber Ashley  Medical record number: [id number]  Date of birth: 12/30/1945   I have seen and evaluated Amber Ashley. Briefly, Amber Ashley is a 72yo woman with PMH of DM2, HTN, OA, DDD who presented after a fall with some post fall right sided chest pain and a left sided hip fracture.  She reports falling from a standing position after going to the bathroom at night (might have been sleep walking).  She called out for help and was transported to the ED by EMS.  She denies any urinary symptoms, fever, chills, recent illness.  She often falls asleep apparently on the toilet when getting up to go to the bathroom at night.  In the ED, there was concern for a UTI given nitrite + UA, but no bacteria.  She reports only increased frequency and did tell us her urine occasionally smelled foul.  She reports that she has never had a DEXA scan.     Vitals:   05/21/17 0015 05/21/17 0612  BP: (!) 110/59 (!) 98/56  Pulse: 60 (!) 53  Resp: 15 12  Temp: 97.6 F (36.4 C) 98.2 F (36.8 C)  SpO2: 98% 98%   Physical Exam:  General: Alert, awake, reports no pain Eyes: Anicteric sclerae, no conjunctival pallor CV: RR, NR, no murmur noted Pulm: Breathing comfortably, no audible wheezing Abd: Soft, NT, ND Ext: Left leg is warm and pulses are palpable post op.  She is able to move her toes in the LLE and RLE  CT hip left: Acute left hip fracture.    Assessment and Plan: I have seen and evaluated the patient as outlined above. I agree with the formulated Assessment and Plan as detailed in the residents' note, with the following changes:   1. Acute fragility fracture - Patient went to surgery last night, doing well this AM - Orthopedics following - Pain control with morphine PRN - PT/OT - Can start bisphosphonate 30 days post op - Discussed getting a DEXA scan with patient once she has healed through her PCP  2  Abnormal UA - Repeat UA  Other issues per Dr. Vesta MixerMelvin's daily note.   Inez CatalinaMullen, Wretha Laris B, MD 2/17/201911:02 AM

## 2017-05-22 ENCOUNTER — Encounter (HOSPITAL_COMMUNITY): Payer: Self-pay | Admitting: Orthopaedic Surgery

## 2017-05-22 DIAGNOSIS — Z880 Allergy status to penicillin: Secondary | ICD-10-CM

## 2017-05-22 DIAGNOSIS — S72042A Displaced fracture of base of neck of left femur, initial encounter for closed fracture: Secondary | ICD-10-CM

## 2017-05-22 DIAGNOSIS — X58XXXA Exposure to other specified factors, initial encounter: Secondary | ICD-10-CM

## 2017-05-22 DIAGNOSIS — M810 Age-related osteoporosis without current pathological fracture: Secondary | ICD-10-CM

## 2017-05-22 DIAGNOSIS — S7225XA Nondisplaced subtrochanteric fracture of left femur, initial encounter for closed fracture: Secondary | ICD-10-CM

## 2017-05-22 LAB — CBC
HCT: 35.4 % — ABNORMAL LOW (ref 36.0–46.0)
Hemoglobin: 11.4 g/dL — ABNORMAL LOW (ref 12.0–15.0)
MCH: 31.3 pg (ref 26.0–34.0)
MCHC: 32.2 g/dL (ref 30.0–36.0)
MCV: 97.3 fL (ref 78.0–100.0)
PLATELETS: 163 10*3/uL (ref 150–400)
RBC: 3.64 MIL/uL — AB (ref 3.87–5.11)
RDW: 15.1 % (ref 11.5–15.5)
WBC: 8.2 10*3/uL (ref 4.0–10.5)

## 2017-05-22 LAB — BASIC METABOLIC PANEL
ANION GAP: 10 (ref 5–15)
BUN: 16 mg/dL (ref 6–20)
CO2: 24 mmol/L (ref 22–32)
Calcium: 8.1 mg/dL — ABNORMAL LOW (ref 8.9–10.3)
Chloride: 105 mmol/L (ref 101–111)
Creatinine, Ser: 0.75 mg/dL (ref 0.44–1.00)
GFR calc Af Amer: >60 mL/min (ref >60)
GLUCOSE: 166 mg/dL — AB (ref 65–99)
POTASSIUM: 3.7 mmol/L (ref 3.5–5.1)
Sodium: 139 mmol/L (ref 135–145)

## 2017-05-22 LAB — GLUCOSE, CAPILLARY
GLUCOSE-CAPILLARY: 90 mg/dL (ref 65–99)
Glucose-Capillary: 131 mg/dL — ABNORMAL HIGH (ref 65–99)

## 2017-05-22 MED ORDER — OXYCODONE HCL 5 MG PO TABS: ORAL_TABLET | ORAL | 0 refills | Status: DC

## 2017-05-22 MED ORDER — ACETAMINOPHEN 500 MG PO TABS: 1000.0000 mg | ORAL_TABLET | Freq: Three times a day (TID) | ORAL | 0 refills | Status: DC

## 2017-05-22 MED ORDER — ENOXAPARIN SODIUM 40 MG/0.4ML ~~LOC~~ SOLN: 40.0000 mg | SUBCUTANEOUS | 1 refills | Status: DC

## 2017-05-22 MED ORDER — OXYCODONE HCL 5 MG PO TABS
5.0000 mg | ORAL_TABLET | ORAL | Status: DC | PRN
  Administered 2017-05-22: 5 mg via ORAL
  Administered 2017-05-22: 10 mg via ORAL
  Filled 2017-05-22: qty 1
  Filled 2017-05-22: qty 2

## 2017-05-22 MED ORDER — ACETAMINOPHEN 500 MG PO TABS: 1000.0000 mg | ORAL_TABLET | Freq: Three times a day (TID) | ORAL | 0 refills | Status: AC | PRN

## 2017-05-22 MED ORDER — ONDANSETRON HCL 4 MG PO TABS: 4.0000 mg | ORAL_TABLET | Freq: Three times a day (TID) | ORAL | 1 refills | Status: AC | PRN

## 2017-05-22 MED ORDER — ENOXAPARIN SODIUM 40 MG/0.4ML ~~LOC~~ SOLN: 40.0000 mg | SUBCUTANEOUS | 0 refills | Status: DC

## 2017-05-22 MED ORDER — MELOXICAM 7.5 MG PO TABS: 7.5000 mg | ORAL_TABLET | Freq: Every day | ORAL | 2 refills | Status: AC

## 2017-05-22 NOTE — Discharge Summary (Signed)
Name: Amber Ashley MRN: [id number] DOB: 06/17/1945 72 y.o. PCP: Treasa SchoolLand, Phillip, PA-C  Date of Admission: 05/20/2017  6:53 AM Date of Discharge: 05/23/2017 Attending Physician: Doneen PoissonKlima, Lawrence, MD  Discharge Diagnosis:  Active Problems:   Closed left subtrochanteric femur fracture (HCC)   Closed displaced fracture of base of neck of left femur (HCC)   Fall   Osteoporosis   Discharge Medications: Allergies as of 05/22/2017      Reactions   Penicillins Hives         Medication List    STOP taking these medications   lisinopril-hydrochlorothiazide 20-25 MG tablet Commonly known as:  PRINZIDE,ZESTORETIC     TAKE these medications   acetaminophen 500 MG tablet Commonly known as:  TYLENOL Take 2 tablets (1,000 mg total) by mouth every 8 (eight) hours as needed for up to 14 days for mild pain or moderate pain.   diclofenac sodium 1 % Gel Commonly known as:  VOLTAREN Apply 2 g topically 4 (four) times daily as needed (knee pain).   docusate sodium 100 MG capsule Commonly known as:  COLACE 1 tab 2 times a day while on narcotics.  STOOL SOFTENER   enoxaparin 40 MG/0.4ML injection Commonly known as:  LOVENOX Inject 0.4 mLs (40 mg total) into the skin daily for 14 days.   glimepiride 1 MG tablet Commonly known as:  AMARYL Take 1 mg by mouth daily with breakfast.   levothyroxine 25 MCG tablet Commonly known as:  SYNTHROID, LEVOTHROID Take 25 mcg by mouth daily before breakfast.   meloxicam 7.5 MG tablet Commonly known as:  MOBIC Take 1 tablet (7.5 mg total) by mouth daily. What changed:    medication strength  how much to take   metFORMIN 500 MG tablet Commonly known as:  GLUCOPHAGE Take 500 mg by mouth 2 (two) times daily with a meal.   ondansetron 4 MG tablet Commonly known as:  ZOFRAN Take 1 tablet (4 mg total) by mouth every 8 (eight) hours as needed for up to 7 days for nausea or vomiting.   oxyCODONE 15 MG immediate release tablet Commonly known as:   ROXICODONE Take 15 mg by mouth every 6 (six) hours as needed (pain).   pregabalin 200 MG capsule Commonly known as:  LYRICA Take 200 mg by mouth 3 (three) times daily.   rOPINIRole 3 MG tablet Commonly known as:  REQUIP Take 3 mg by mouth at bedtime.   vitamin B-12 1000 MCG tablet Commonly known as:  CYANOCOBALAMIN Take 1,000 mcg by mouth daily.   Vitamin D 2000 units Caps Take 5,000 Units by mouth daily.       Disposition and follow-up:   Ms.Amber Ashley was discharged from [facility] in Stable condition.  At the hospital follow up visit please address:  1. Left Femur Fracture: Please ensure patient has adequate pain control; is receiving home health PT; and has follow up with orthopedic suregry.  2. Osteoporosis: Patient clinically diagnoses with osteoporosis as she experience a fracture following a fall from standing height. Please obtain DEXA scan. Current guidelines recommend starting Bisphosphonate therapy 30 days post-op.  3. Hypertension: Patient's BP remained low-normal while admitted. IV morphine may have contributed, but she endorses recent weight loss as well. Lisinopril-HCTZ was held and then discontinued on discharge. Please evaluate patient's needs for antihypertensive medications.  2.  Labs / imaging needed at time of follow-up: DEXA Scan   3.  Pending labs/ test needing follow-up: None  Follow-up  Appointments: Follow-up Information    Bjorn Pippin, MD. Call in 1 week(s).   Specialty:  Orthopedic Surgery Contact information: [address] [place] [address] [phone]        [name], [place]. Schedule an appointment as soon as possible for a visit.   Specialty:  Physician Assistant Why:  Please make an appointment for a day in the next 2 weeks Contact information: [address] [place] [address] [phone]        Home[facility] Follow up.   Specialty:  Home Health  Services Why:  A representative from [facility] will contact you to arrange start date and time for your therapy.  Contact information: [address] [place] [address] [phone]           Hospital Course by problem list:   Fall Left Femur Fracture Osteoporosis: Patient presented via EMS with right chest and left hip pain following falls at home. She had initially fallen in her bathroom after standing up from the toilet and finding her legs had gone numb (as she had fallen asleep on the toilet) at which point she hit her right chest on her tub. She then fell a second time after crawling out of the bathroom and injured her left hip. Patient had stable vitals with low-normal BP. She denied head trauma and CT Head was negative for acute abnormality. No acute abnormalities noted on left knee or lumbar xray. Xray suggested and CT of L hip confirmed fracture. Osteopenia was noted on Xray of left hip and knee. She was taken for left hip arthroplasty by orthopedics on the night of presentation. She tolerated the surgery well and pain control was transitioned to oral narcotic pain medications after the first day of recovery. As she experienced a fracture after falling from standing height, she was clinically diagnosed with osteoporosis. She was evaluated by PT and OT who recommended Home health PT follow up. Patient to follow up with her PCP and Orthopedic Surgery. She was discharged on her home oxycodone, Mobic, and tylenol for pain control.  Discharge Vitals:   BP (!) 118/54 (BP Location: Right Arm)   Pulse 63   Temp 98 F (36.7 C) (Oral)   Resp 17   Ht 5\' 3"  (1.6 m)   Wt 199 lb (90.3 kg)   SpO2 100%   BMI 35.25 kg/m   Pertinent Labs, Studies, and Procedures:  CBC Latest Ref Rng & Units 05/22/2017 05/21/2017 05/20/2017  WBC 4.0 - 10.5 K/uL 8.2 7.5 8.9  Hemoglobin 12.0 - 15.0 g/dL 11.4(L) 12.0 13.6  Hematocrit 36.0 - 46.0 % 35.4(L) 36.7 41.9  Platelets 150 - 400 K/uL 163  168 167   BMP Latest Ref Rng & Units 05/22/2017 05/21/2017 05/20/2017  Glucose 65 - 99 mg/dL 440(H) 474(Q) -  BUN 6 - 20 mg/dL 16 7 -  Creatinine 5.95 - 1.00 mg/dL 6.38 7.56 4.33  Sodium 135 - 145 mmol/L 139 139 -  Potassium 3.5 - 5.1 mmol/L 3.7 3.8 -  Chloride 101 - 111 mmol/L 105 106 -  CO2 22 - 32 mmol/L 24 24 -  Calcium 8.9 - 10.3 mg/dL 8.1(L) 8.2(L) -   CXR: IMPRESSION: - Low lung volumes without acute findings. - Chronic changes as described.  Lumbar Spine Xray: IMPRESSION: 1. No evidence of acute trauma. 2. Thoracolumbar degenerative disc disease.  Left Knee Xray: IMPRESSION: 1. No definite fracture. System for joint effusion is  limited by marked rotation on the lateral view. 2. Osteopenia.  Left Hip Xray: IMPRESSION: 1. Apparent foreshortening of the left femoral neck is worrisome for a femoral neck fracture. 2. Osteopenia.  Left Hip CT: IMPRESSION: Acute, impacted subcapital femoral neck fracture.  CT Head: IMPRESSION: Atrophy without acute intracranial abnormality.  Post-Op Pelvis Xray: IMPRESSION: Status post left hip arthroplasty. Hardware appears intact and appropriately positioned. No evidence of surgical complicating feature.  Discharge Instructions: Discharge Instructions    Call MD for:  difficulty breathing, headache or visual disturbances   Complete by:  As directed    Call MD for:  persistant dizziness or light-headedness   Complete by:  As directed    Call MD for:  persistant nausea and vomiting   Complete by:  As directed    Call MD for:  redness, tenderness, or signs of infection (pain, swelling, redness, odor or green/yellow discharge around incision site)   Complete by:  As directed    Call MD for:  severe uncontrolled pain   Complete by:  As directed    Call MD for:  temperature >100.4   Complete by:  As directed    Diet - low sodium heart healthy   Complete by:  As directed    Discharge instructions   Complete by:  As directed    Thank  you for allowing [place] to care for you.  Your symptoms were due to a fractured Hip. - For pain: You can continue to take your home Oxycodone and Acetamimophen - We have ordered home health PT/OT for you - Please follow up with orthopedics as scheduled by them - Please schedule a follow up with your PCP in the next 1-2 weeks - At PCP follow up you will need to discuss DEXA scan and Bisphosphonate therapy for osteoporosis  For your high blood pressure: - Your blood pressure has been low to normal while in the hospital. We have stopped your blood pressure medicine for now. Please ask your PCP about your need to continue this medication when you follow up in the next 1-2 weeks.  Contact a medical professional if you experience severe symptoms.   Discharge instructions   Complete by:  As directed    Ramond Marrow MD, MPH [facility] [address] [phone] (tel)   [phone] (fax)  POST-OPERATIVE INSTRUCTIONS  WOUND CARE You may remove the operative dressing 7 days postop and leave open to air afterwards KEEP THE INCISIONS CLEAN AND DRY. You may shower on Post-Op Day #2. Gently pat the area dry.  The dressing is waterproof. Do not soak the knee in water. Do not go swimming in the pool or ocean until 4 weeks after surgery or when otherwise instructed.  EXERCISES You may bear weight as tolerated on your operative extremity. When in bed and while sleeping it is imperative you wear your knee immobilizer until we instruct you otherwise while in clinic. It is essential that you follow your posterior hip precautions.  Do not sit in a low chair. (No hip flexion >90 degrees) Do not cross your legs.  (No Adduction past neutral) Keep your toes pointed straight ahead at all times (No internal rotation or excessive external rotation) Therapy may be performed by an outpatient therapist or at a rehab facility  POST-OP MEDICINES A multi-modal approach will be used to  treat your pain. Oxycodone - This is a strong narcotic, to be used only on an "as needed" basis for pain. Acetaminophen -  A non-narcotic pain medicine.  Use 1000mg  three times a day for the first 14 days after surgery. Lovenox 40mg  -  This is a medicine used to help prevent blood clots.  Please use it daily until your primary doctor can transition you to a more long term oral medicine. If you have any adverse effects with the medications, please call our office.  FOLLOW-UP If you develop a Fever (<101.5), Redness or Drainage from the surgical incision site, please call our office to arrange for an evaluation. Please call the office to schedule a follow-up appointment for your suture removal, 10-14 days post-operatively. IF YOU HAVE ANY QUESTIONS, PLEASE FEEL FREE TO CALL OUR OFFICE.   Face-to-face encounter (required for Medicare/Medicaid patients)   Complete by:  As directed    I Beola Cord certify that this patient is under my care and that I, or a nurse practitioner or physician's assistant working with me, had a face-to-face encounter that meets the physician face-to-face encounter requirements with this patient on 05/22/2017. The encounter with the patient was in whole, or in part for the following medical condition(s) which is the primary reason for home health care (List medical condition): Left Subcapital Femoral Neck Fracture   The encounter with the patient was in whole, or in part, for the following medical condition, which is the primary reason for home health care:  Left Subcapital Femoral Neck Fracture   I certify that, based on my findings, the following services are medically necessary home health services:  Physical therapy   Reason for Medically Necessary Home Health Services:   Therapy- Investment banker, operational, Patent examiner Therapy- Therapeutic Exercises to Increase Strength and Endurance     My clinical findings support the need for the above services:  Pain  interferes with ambulation/mobility   Further, I certify that my clinical findings support that this patient is homebound due to:  Unable to leave home safely without assistance   Home Health   Complete by:  As directed    To provide the following care/treatments:   PT OT Home Health Aide     Increase activity slowly   Complete by:  As directed       Signed: Beola Cord, MD 05/23/2017, 11:27 AM   Pager: [phone]

## 2017-05-22 NOTE — Progress Notes (Signed)
Internal Medicine Attending  Date: 05/22/2017  Patient name: Amber HurterBetty K Strauser Medical record number: [id number] Date of birth: 07/17/1945 Age: 72 y.o. Gender: female  I saw and evaluated the patient. I reviewed the resident's note by Dr. Alinda MoneyMelvin and I agree with the resident's findings and plans as documented in his progress note.  When seen on rounds this morning [name] was without complaints stating her pain was well controlled and at a 2 out of 10 at the time we saw her. She is excited to be going home today with home health services. I agree with discharge home and follow-up with the orthopedic surgeon and her primary care provider.

## 2017-05-22 NOTE — Progress Notes (Signed)
   Subjective: [name] was seen resting in her bed today with her husband at bedside. She states she is doing well and her pain is controlled on her current regimen. She will be going home with home PT and states she is ready to go. She states she is very appreciative of the staff and care she has received. No further complaints or questions this morning.  Objective:  Vital signs in last 24 hours: Vitals:   05/21/17 0612 05/21/17 1424 05/21/17 1952 05/22/17 0352  BP: (!) 98/56 (!) 107/59 (!) 113/56 (!) 118/54  Pulse: (!) 53 66 72 63  Resp: 12 16 18 17   Temp: 98.2 F (36.8 C) 98 F (36.7 C) 98.6 F (37 C) 98 F (36.7 C)  TempSrc: Oral Oral Oral Oral  SpO2: 98% 94% 98% 100%  Weight:      Height:       Physical Exam  Constitutional: She is oriented to person, place, and time. She appears well-developed and well-nourished.  HENT:  Head: Normocephalic and atraumatic.  Eyes: EOM are normal. Right eye exhibits no discharge. Left eye exhibits no discharge.  Cardiovascular: Normal rate, regular rhythm, normal heart sounds and intact distal pulses.  Pulmonary/Chest: Effort normal and breath sounds normal. No respiratory distress.  Abdominal: Soft. Bowel sounds are normal. She exhibits no distension. There is no tenderness.  Musculoskeletal: She exhibits no edema or deformity.  Clean dry dressing in place at left hip  Neurological: She is alert and oriented to person, place, and time.  Skin: Skin is warm and dry.   Assessment/Plan: 72 yo F with a history of Diabetes, HTN, OA, Degenerative Disk Disease (s/p lumbar laminectomy), Arthritis and Diverticulitis who presents with right chest and left hip pain following a fall.  Osteoporosis Left Subcapital Femur Neck Fracture: Fracture verifired on CT. Occurred during a fall at home. S/P surgical fixation 2/16. Patient with clinical diagnosis of osteoporosis with this fragility fracture. Will need to initiate bisphosphonate therapy 30 days  from the date of surgery. - Appreciate Orthopedics Recommendations - Oxycodone 5-10mg  q4h, PRN Moderate Pain - Acetaminophen 1,000mg  q6h - PT Recommends HH PT & 24hr assistance - OT Eval and Treat - Pulmonary Toilet  ?UTI:  Patient received 1 dose of ceftriaxone in ED for U/A showing leukocytes and nitrites, but no bacteria. Patient has noticed intermittent odor to her urine, but not latlely, adittinally but denies increased frequency or dysuria. Will not further treat asymptomatic patient.  HTN: BP hypo to normotensive this admission. - Hold home Lisinopril-HCTZ and will hold on discharge.  Diabetes: On metformin and Glimepiride at home. - Continue home Lyrica for diabetic neuropathy - Hold home meds - SSI  FEN: Regular Diet VTE ppx: Lovenox, SCDs Code Status: FULL   Dispo: Anticipated discharge Today.  Amber Ashley, Amber Haynesworth, MD 05/22/2017, 11:28 AM Pager: [phone]

## 2017-05-22 NOTE — Progress Notes (Signed)
Physical Therapy Treatment Patient Details Name: Amber HurterBetty K Ashley MRN: [id number] DOB: 04/20/1945 Today's Date: 05/22/2017    History of Present Illness Admitted after fall resulting in L hip fracture; now s/p L hip hemiarthroplasty, Post Prec, WBAT;  has a past medical history of Arthritis, Chronic back pain, Complication of anesthesia, Constipation, Diabetes mellitus without complication (HCC), Gout, History of blood transfusion, Hypertension, Joint pain, Nocturia, Primary localized osteoarthritis of left knee, Restless leg syndrome, Urinary frequency, Weakness, and Wound dehiscence.    PT Comments    Patient is making good progress with PT.  From a mobility standpoint anticipate patient will be ready for DC home when medically ready.    Follow Up Recommendations  Home health PT;Supervision/Assistance - 24 hour     Equipment Recommendations  None recommended by PT(pretty well-equipped)    Recommendations for Other Services OT consult(as ordered)     Precautions / Restrictions Precautions Precautions: Posterior Hip Precaution Booklet Issued: Yes (comment) Precaution Comments: precautions reviewed with pt and husband Restrictions Weight Bearing Restrictions: Yes LLE Weight Bearing: Weight bearing as tolerated    Mobility  Bed Mobility               General bed mobility comments: pt OOB in chair upon arrival  Transfers Overall transfer level: Needs assistance Equipment used: Rolling walker (2 wheeled) Transfers: Sit to/from Stand Sit to Stand: Supervision         General transfer comment: supervision for safety  Ambulation/Gait Ambulation/Gait assistance: Supervision Ambulation Distance (Feet): 200 Feet Assistive device: Rolling walker (2 wheeled) Gait Pattern/deviations: Decreased step length - right;Decreased stance time - left;Step-to pattern;Step-through pattern Gait velocity: decreased   General Gait Details: cues for sequencing and step length  symmetry   Stairs Stairs: Yes   Stair Management: One rail Left;Step to pattern;Sideways Number of Stairs: 2 General stair comments: cues for sequencing and technique  Wheelchair Mobility    Modified Rankin (Stroke Patients Only)       Balance Overall balance assessment: Needs assistance   Sitting balance-Leahy Scale: Good     Standing balance support: Bilateral upper extremity supported;During functional activity Standing balance-Leahy Scale: Fair Standing balance comment: pt is able to static stand without UE support                            Cognition Arousal/Alertness: Awake/alert Behavior During Therapy: WFL for tasks assessed/performed Overall Cognitive Status: Within Functional Limits for tasks assessed                                        Exercises      General Comments General comments (skin integrity, edema, etc.): husband present; HEP handout reviewed       Pertinent Vitals/Pain Pain Assessment: 0-10 Pain Score: 2  Pain Location: L hip Pain Descriptors / Indicators: Guarding;Sore Pain Intervention(s): Monitored during session;Repositioned;Ice applied    Home Living                      Prior Function            PT Goals (current goals can now be found in the care plan section) Acute Rehab PT Goals Patient Stated Goal: home PT Goal Formulation: With patient Time For Goal Achievement: 05/28/17 Potential to Achieve Goals: Good Progress towards PT goals: Progressing toward goals    Frequency  Min 6X/week      PT Plan Current plan remains appropriate    Co-evaluation              AM-PAC PT "6 Clicks" Daily Activity  Outcome Measure  Difficulty turning over in bed (including adjusting bedclothes, sheets and blankets)?: A Little Difficulty moving from lying on back to sitting on the side of the bed? : A Little Difficulty sitting down on and standing up from a chair with arms (e.g.,  wheelchair, bedside commode, etc,.)?: A Little Help needed moving to and from a bed to chair (including a wheelchair)?: A Little Help needed walking in hospital room?: A Little Help needed climbing 3-5 steps with a railing? : A Little 6 Click Score: 18    End of Session Equipment Utilized During Treatment: Gait belt Activity Tolerance: Patient tolerated treatment well Patient left: with call bell/phone within reach;in chair;with family/visitor present Nurse Communication: Mobility status PT Visit Diagnosis: Other abnormalities of gait and mobility (R26.89);Pain Pain - Right/Left: Left Pain - part of body: Hip     Time: 4098-1191 PT Time Calculation (min) (ACUTE ONLY): 46 min  Charges:  $Gait Training: 23-37 mins $Therapeutic Activity: 8-22 mins                    G Codes:       Erline Levine, PTA Pager: [phone]     Carolynne Edouard 05/22/2017, 11:21 AM

## 2017-05-22 NOTE — Progress Notes (Signed)
ORTHOPAEDIC PROGRESS NOTE  s/p Procedure(s): ARTHROPLASTY UNIPOLAR L HIP (HEMIARTHROPLASTY)  SUBJECTIVE: Reports mild pain about operative site. No chest pain. No SOB. No nausea/vomiting. No other complaints.  OBJECTIVE: PE: LLE: incision CDI, abduction pillow in place, leg lengths equal, warm well perfused foot, intact EHL/TA/GSC  Vitals:   05/21/17 1952 05/22/17 0352  BP: (!) 113/56 (!) 118/54  Pulse: 72 63  Resp: 18 17  Temp: 98.6 F (37 C) 98 F (36.7 C)  SpO2: 98% 100%     ASSESSMENT: Amber Ashley is a 72 y.o. female doing well postoperatively.  PLAN: Weightbearing: WBAT LLE, posterior hip precautions Insicional and dressing care: Dressings left intact until follow-up Orthopedic device(s): abduction pillow while in bed Showering: ok to shower with aquacel in place VTE prophylaxis: Lovenox 40mg  qd 2 weeks Pain control: prn medications. Follow - up plan: 1 week Contact information:  Weekdays 8-5 Ramond Marrowax Janiece Scovill MD [phone], After hours and holidays please check [email or web address] for group call information for [facility]

## 2017-05-22 NOTE — Care Management Note (Signed)
Case Management Note  Patient Details  Name: Amber Ashley MRN: [id number] Date of Birth: 09/17/1945  Subjective/Objective:  72 yr old female s/p left unipolar hemiarthroplasty.                   Action/Plan: Patient was preoperatively setup with [facility], no changes. Has DME from previous knee surgery. Patient will discharge home with family support.   Expected Discharge Date:  05/22/17               Expected Discharge Plan:  Home w Home Health Services  In-House Referral:  NA  Discharge planning Services  CM Consult  Post Acute Care Choice:  Home Health Choice offered to:  Patient  DME Arranged:  (Has RW and 3in1) DME Agency:  NA  HH Arranged:  PT HH Agency:  [facility] (formerly [facility])  Status of Service:  Completed, signed off  If discussed at [employer] Meetings, dates discussed:    Additional Comments:  Amber Ashley, Amber Pulcini Naomi, RN 05/22/2017, 12:21 PM

## 2017-05-22 NOTE — Evaluation (Signed)
Occupational Therapy Evaluation Patient Details Name: Amber Ashley Read MRN: [id number] DOB: 12/29/1945 Today's Date: 05/22/2017    History of Present Illness Admitted after fall resulting in L hip fracture; now s/p L hip hemiarthroplasty, Post Prec, WBAT;  has a past medical history of Arthritis, Chronic back pain, Complication of anesthesia, Constipation, Diabetes mellitus without complication (HCC), Gout, History of blood transfusion, Hypertension, Joint pain, Nocturia, Primary localized osteoarthritis of left knee, Restless leg syndrome, Urinary frequency, Weakness, and Wound dehiscence.   Clinical Impression   Patient is s/p L hip hemiarthoplasty surgery resulting in functional limitations due to the deficits listed below (see OT problem list). Pt able to recall hip precautions and educated on ADls with precautions. Pt plan to purchase AE. Pt educated to sponge bath only until able to complete tub transfer safely.  Patient will benefit from skilled OT acutely to increase independence and safety with ADLS to allow discharge home.     Follow Up Recommendations  No OT follow up    Equipment Recommendations  None recommended by OT(has all necessary DMe per chart. Recommend Reacher)    Recommendations for Other Services       Precautions / Restrictions Precautions Precautions: Posterior Hip Precaution Booklet Issued: Yes (comment) Precaution Comments: reviewed posterior hip precautions for adls Restrictions Weight Bearing Restrictions: No LLE Weight Bearing: Weight bearing as tolerated      Mobility Bed Mobility Overal bed mobility: Needs Assistance Bed Mobility: Supine to Sit     Supine to sit: Mod assist;HOB elevated     General bed mobility comments: pt requires (A) from therapist by reaching with UE toward therapist to pull up into sitting. bed rail removed during transfer to simulate home. Bed placed at similar height to home bed  Transfers Overall transfer level: Needs  assistance Equipment used: Rolling walker (2 wheeled) Transfers: Sit to/from Stand Sit to Stand: Supervision         General transfer comment: cues for hand placement and cues for L LE for sitting    Balance Overall balance assessment: Needs assistance   Sitting balance-Leahy Scale: Good     Standing balance support: Bilateral upper extremity supported;During functional activity Standing balance-Leahy Scale: Fair Standing balance comment: relies on RW                           ADL either performed or assessed with clinical judgement   ADL Overall ADL's : Needs assistance/impaired Eating/Feeding: Independent   Grooming: Independent   Upper Body Bathing: Independent   Lower Body Bathing: Supervison/ safety;Adhering to hip precautions;With adaptive equipment Lower Body Bathing Details (indicate cue type and reason): educated on reacher     Lower Body Dressing: Supervision/safety;With adaptive equipment;Sit to/from stand Lower Body Dressing Details (indicate cue type and reason): educated on reacher and plans to purchase Toilet Transfer: Supervision/safety;RW       Tub/ Shower Transfer: Tub transfer;With caregiver independent assisting;Minimal assistance Tub/Shower Transfer Details (indicate cue type and reason): educated on transfer with 3n1 and provided handout. pt educated to keep the L LE toward the outside the tub to keep correct sequence.  Functional mobility during ADLs: Supervision/safety;Rolling walker General ADL Comments: pt and spouse educated on bed mobility in depth and have a hosptial bed at home. pt educated on leg lifter if needed from a sheet. educated on fresh clean linen and bathing for home d/c. pt positioned in chair and educated on ice to surgical area  Vision Baseline Vision/History: Wears glasses Wears Glasses: At all times       Perception     Praxis      Pertinent Vitals/Pain Pain Assessment: Faces Pain Score: 2  Faces  Pain Scale: Hurts a little bit Pain Location: L hip Pain Descriptors / Indicators: Guarding;Sore Pain Intervention(s): Monitored during session;Premedicated before session;Repositioned;Ice applied(REPORTS feels good to sit up)     Hand Dominance Right   Extremity/Trunk Assessment Upper Extremity Assessment Upper Extremity Assessment: Overall WFL for tasks assessed   Lower Extremity Assessment Lower Extremity Assessment: Defer to PT evaluation   Cervical / Trunk Assessment Cervical / Trunk Assessment: Normal   Communication Communication Communication: No difficulties   Cognition Arousal/Alertness: Awake/alert Behavior During Therapy: WFL for tasks assessed/performed Overall Cognitive Status: Within Functional Limits for tasks assessed                                     General Comments  husband present for all education and provided handout education as well. no further questions at this time    Exercises     Shoulder Instructions      Home Living Family/patient expects to be discharged to:: Private residence Living Arrangements: Spouse/significant other Available Help at Discharge: Family;Available 24 hours/day Type of Home: House Home Access: Stairs to enter [employer] of Steps: 3 Entrance Stairs-Rails: Left Home Layout: One level     Bathroom Shower/Tub: Chief Strategy Officer: Standard     Home Equipment: Environmental consultant - 2 wheels;Bedside commode          Prior Functioning/Environment Level of Independence: Independent                 OT Problem List: Decreased activity tolerance;Decreased knowledge of precautions;Decreased knowledge of use of DME or AE;Decreased safety awareness;Pain;Obesity;Decreased strength;Impaired balance (sitting and/or standing)      OT Treatment/Interventions: Self-care/ADL training;Therapeutic activities;Therapeutic exercise;DME and/or AE instruction;Patient/family education;Balance  training    OT Goals(Current goals can be found in the care plan section) Acute Rehab OT Goals Patient Stated Goal: home OT Goal Formulation: With patient/family Time For Goal Achievement: 06/05/17 Potential to Achieve Goals: Good  OT Frequency: Min 2X/week   Barriers to D/C:            Co-evaluation              AM-PAC PT "6 Clicks" Daily Activity     Outcome Measure Help from another person eating meals?: None Help from another person taking care of personal grooming?: None Help from another person toileting, which includes using toliet, bedpan, or urinal?: None Help from another person bathing (including washing, rinsing, drying)?: A Little Help from another person to put on and taking off regular upper body clothing?: None Help from another person to put on and taking off regular lower body clothing?: A Little 6 Click Score: 22   End of Session Equipment Utilized During Treatment: Gait belt;Rolling walker Nurse Communication: Mobility status;Precautions  Activity Tolerance: Patient tolerated treatment well Patient left: in chair;with call bell/phone within reach;with family/visitor present  OT Visit Diagnosis: Unsteadiness on feet (R26.81)                Time: 9604-5409 OT Time Calculation (min): 37 min Charges:  OT General Charges $OT Visit: 1 Visit OT Evaluation $OT Eval Moderate Complexity: 1 Mod OT Treatments $Self Care/Home Management : 8-22 mins G-Codes:  Amber Ashley   OTR/L Pager: [phone] Office: [phone] .   Amber Ashley 05/22/2017, 11:34 AM

## 2017-05-23 DIAGNOSIS — M810 Age-related osteoporosis without current pathological fracture: Secondary | ICD-10-CM

## 2017-05-23 HISTORY — DX: Age-related osteoporosis without current pathological fracture: M81.0

## 2017-12-15 ENCOUNTER — Encounter: Payer: Self-pay | Admitting: *Deleted

## 2017-12-15 ENCOUNTER — Telehealth: Payer: Self-pay | Admitting: *Deleted

## 2017-12-15 NOTE — Progress Notes (Signed)
OPEN IN ERROR 

## 2017-12-15 NOTE — Telephone Encounter (Signed)
Scheduling Request from [facility], Harlon DittyPhil Land, PA-C sent to scheduling box

## 2018-01-17 NOTE — Progress Notes (Deleted)
No chief complaint on file.  History of Present Illness: 10 female with history of DM and HTN who is here today as a new patient for evaluation of ***. She was seen in February 2016 by Dr. Antoine Poche for evaluation of chest pain. He noted a prior echo that was normal but I cannot see the report on this echo. She tells me today that she ***  Primary Care Physician: [name], PA-C  Past Medical History:  Diagnosis Date  . Arthritis    degenerative lumbar spine, back injection with Dr. Sherene Sires office    . Chronic back pain    stenosis  . Closed displaced fracture of base of neck of left femur (HCC)   . Closed left subtrochanteric femur fracture (HCC) 05/20/2017  . Complication of anesthesia    woke up very slowly, states she was told that " we had a time waking me you up"  . Constipation    takes Stool Softener daily  . Diabetes mellitus without complication (HCC)    takes Amaryl and Metformin daily  . Diverticulitis   . DJD (degenerative joint disease) of knee 05/19/2014  . Fall   . Gout    takes Allopurinol daily  . History of blood transfusion    no abnormal reaction noted  . Hypertension    takes Lisinopril-HCTZ daily  . Joint pain   . Lumbar pain with radiation down right leg   . Nocturia   . Osteoporosis 05/23/2017  . Precordial chest pain 05/09/2014  . Primary localized osteoarthritis of left knee   . Primary localized osteoarthritis of right knee   . Restless leg syndrome    takes Lyrica daily  . S/P lumbar laminectomy 03/12/2015  . Urinary frequency   . Weakness    numbness and tingling in both legs  . Wound dehiscence     Past Surgical History:  Procedure Laterality Date  . Bladder Tacking  1981  . CHOLECYSTECTOMY  1978   [facility].   . ESOPHAGOGASTRODUODENOSCOPY    . EYE SURGERY     cataracts removed bilateral /w IOL  . HIP ARTHROPLASTY Left 05/20/2017   Procedure: ARTHROPLASTY BIPOLAR HIP (HEMIARTHROPLASTY);  Surgeon: Bjorn Pippin, MD;  Location:  [facility] OR;  Service: Orthopedics;  Laterality: Left;  . LUMBAR LAMINECTOMY  04/21/2015  . LUMBAR LAMINECTOMY/DECOMPRESSION MICRODISCECTOMY Bilateral 03/12/2015   Procedure: Bilateral Lumbar Three-Four, Lumbar Four-Five Laminectomy and Foraminotomy ;  Surgeon: Tia Alert, MD;  Location: [facility] NEURO ORS;  Service: Neurosurgery;  Laterality: Bilateral;  . LUMBAR WOUND DEBRIDEMENT N/A 04/21/2015   Procedure: Lumbar wound revision;  Surgeon: Tia Alert, MD;  Location: [facility];  Service: Neurosurgery;  Laterality: N/A;  . PARTIAL HYSTERECTOMY  1998   Partial  . TOTAL KNEE ARTHROPLASTY Left 05/19/2014   dr Thurston Hole  . TOTAL KNEE ARTHROPLASTY Left 05/19/2014   Procedure: LEFT TOTAL KNEE ARTHROPLASTY;  Surgeon: Nilda Simmer, MD;  Location: [facility] OR;  Service: Orthopedics;  Laterality: Left;  . TUBAL LIGATION Bilateral 1980  . VARICOSE VEIN SURGERY      Current Outpatient Medications  Medication Sig Dispense Refill  . Cholecalciferol (VITAMIN D) 2000 UNITS CAPS Take 5,000 Units by mouth daily.     . diclofenac sodium (VOLTAREN) 1 % GEL Apply 2 g topically 4 (four) times daily as needed (knee pain).    Marland Kitchen docusate sodium (COLACE) 100 MG capsule 1 tab 2 times a day while on narcotics.  STOOL SOFTENER (Patient not taking: Reported  on 05/02/2016) 60 capsule 0  . enoxaparin (LOVENOX) 40 MG/0.4ML injection Inject 0.4 mLs (40 mg total) into the skin daily for 14 days. 5.6 mL 0  . glimepiride (AMARYL) 1 MG tablet Take 1 mg by mouth daily with breakfast.    . levothyroxine (SYNTHROID, LEVOTHROID) 25 MCG tablet Take 25 mcg by mouth daily before breakfast.    . meloxicam (MOBIC) 7.5 MG tablet Take 1 tablet (7.5 mg total) by mouth daily. 30 tablet 2  . metFORMIN (GLUCOPHAGE) 500 MG tablet Take 500 mg by mouth 2 (two) times daily with a meal.    . oxyCODONE (ROXICODONE) 15 MG immediate release tablet Take 15 mg by mouth every 6 (six) hours as needed (pain).     . pregabalin (LYRICA) 200 MG capsule Take 200 mg by  mouth 3 (three) times daily.     Marland Kitchen rOPINIRole (REQUIP) 3 MG tablet Take 3 mg by mouth at bedtime.     . vitamin B-12 (CYANOCOBALAMIN) 1000 MCG tablet Take 1,000 mcg by mouth daily.     No current facility-administered medications for this visit.     Allergies  Allergen Reactions  . Penicillins Hives    Has patient had a PCN reaction causing immediate rash, facial/tongue/throat swelling, SOB or lightheadedness with hypotension: Yes/No Has patient had a PCN reaction causing severe rash involving mucus membranes or skin necrosis: Yes/No Has patient had a PCN reaction that required hospitalization Yes/No Has patient had a PCN reaction occurring within the last 10 years: Yes/No If all of the above answers are "NO", then may proceed with Cephalosporin use.     Social History   Socioeconomic History  . Marital status: Married    Spouse name: Not on file  . Number of children: 4  . Years of education: Not on file  . Highest education level: Not on file  Occupational History  . Not on file  Social Needs  . Financial resource strain: Not on file  . Food insecurity:    Worry: Not on file    Inability: Not on file  . Transportation needs:    Medical: Not on file    Non-medical: Not on file  Tobacco Use  . Smoking status: Never Smoker  . Smokeless tobacco: Never Used  Substance and Sexual Activity  . Alcohol use: No  . Drug use: No  . Sexual activity: Never  Lifestyle  . Physical activity:    Days per week: Not on file    Minutes per session: Not on file  . Stress: Not on file  Relationships  . Social connections:    Talks on phone: Not on file    Gets together: Not on file    Attends religious service: Not on file    Active member of club or organization: Not on file    Attends meetings of clubs or organizations: Not on file    Relationship status: Not on file  . Intimate partner violence:    Fear of current or ex partner: Not on file    Emotionally abused: Not on file      Physically abused: Not on file    Forced sexual activity: Not on file  Other Topics Concern  . Not on file  Social History Narrative   Lives at home with husband and daughter.      Family History  Problem Relation Age of Onset  . Stroke Mother   . Heart attack Mother        Heart  attack while hospitalized with pneumonia at age 40  . Heart disease Father        Vague  . Anxiety disorder Father   . CAD Father   . Depression Father   . Osteoporosis Father   . Congestive Heart Failure Father   . Hypertension Brother   . Diabetes Brother   . CAD Paternal Grandfather   . Diabetes type II Paternal Grandfather   . Hypertension Paternal Grandfather   . Osteoporosis Paternal Grandfather   . Congenital heart disease Paternal Grandfather     Review of Systems:  As stated in the HPI and otherwise negative.   There were no vitals taken for this visit.  Physical Examination: General: Well developed, well nourished, NAD  HEENT: OP clear, mucus membranes moist  SKIN: warm, dry. No rashes. Neuro: No focal deficits  Musculoskeletal: Muscle strength 5/5 all ext  Psychiatric: Mood and affect normal  Neck: No JVD, no carotid bruits, no thyromegaly, no lymphadenopathy.  Lungs:Clear bilaterally, no wheezes, rhonci, crackles Cardiovascular: Regular rate and rhythm. No murmurs, gallops or rubs. Abdomen:Soft. Bowel sounds present. Non-tender.  Extremities: No lower extremity edema. Pulses are 2 + in the bilateral DP/PT.  EKG:  EKG {ACTION; IS/IS ZOX:[id number]} ordered today. The ekg ordered today demonstrates ***  Recent Labs: 05/22/2017: BUN 16; Creatinine, Ser 0.75; Hemoglobin 11.4; Platelets 163; Potassium 3.7; Sodium 139   Lipid Panel    Component Value Date/Time   CHOL  10/04/2009 1013    122        ATP III CLASSIFICATION:  <200     mg/dL   Desirable  981-191  mg/dL   Borderline High  >=478    mg/dL   High          TRIG 56 10/04/2009 1013   HDL 47 10/04/2009 1013   CHOLHDL  2.6 10/04/2009 1013   VLDL 11 10/04/2009 1013   LDLCALC  10/04/2009 1013    64        Total Cholesterol/HDL:CHD Risk Coronary Heart Disease Risk Table                     Men   Women  1/2 Average Risk   3.4   3.3  Average Risk       5.0   4.4  2 X Average Risk   9.6   7.1  3 X Average Risk  23.4   11.0        Use the calculated Patient Ratio above and the CHD Risk Table to determine the patient's CHD Risk.        ATP III CLASSIFICATION (LDL):  <100     mg/dL   Optimal  295-621  mg/dL   Near or Above                    Optimal  130-159  mg/dL   Borderline  308-657  mg/dL   High  >846     mg/dL   Very High     Wt Readings from Last 3 Encounters:  05/20/17 199 lb (90.3 kg)  05/02/16 224 lb (101.6 kg)  04/21/15 222 lb 4 oz (100.8 kg)     Other studies Reviewed: Additional studies/ records that were reviewed today include: ***. Review of the above records demonstrates: ***   Assessment and Plan:   1.   Current medicines are reviewed at length with the patient today.  The patient {ACTIONS; HAS/DOES NOT HAVE:19233} concerns regarding medicines.  The following changes have been made:  {PLAN; NO CHANGE:13088:s}  Labs/ tests ordered today include: *** No orders of the defined types were placed in this encounter.    Disposition:   FU with *** in {gen number 1-61:096045} {TIME; UNITS DAY/WEEK/MONTH:19136}   Signed, Verne Carrow, MD 01/17/2018 1:36 PM    [facility] [address] [place], [place], [place]  [address] Phone: [phone]; Fax: [phone]

## 2018-01-18 ENCOUNTER — Ambulatory Visit: Payer: Medicare Other | Admitting: Cardiovascular Disease

## 2018-02-14 ENCOUNTER — Ambulatory Visit: Payer: Medicare Other | Admitting: Cardiovascular Disease

## 2018-02-27 ENCOUNTER — Ambulatory Visit: Payer: Medicare Other | Admitting: Cardiology

## 2018-04-09 ENCOUNTER — Encounter: Payer: Self-pay | Admitting: *Deleted

## 2018-04-09 ENCOUNTER — Encounter: Payer: Self-pay | Admitting: Cardiovascular Disease

## 2018-04-09 ENCOUNTER — Encounter (INDEPENDENT_AMBULATORY_CARE_PROVIDER_SITE_OTHER): Payer: Self-pay

## 2018-04-09 ENCOUNTER — Ambulatory Visit (INDEPENDENT_AMBULATORY_CARE_PROVIDER_SITE_OTHER): Payer: Medicare Other | Admitting: Cardiovascular Disease

## 2018-04-09 VITALS — BP 130/78 | HR 69 | Ht 63.0 in | Wt 197.0 lb

## 2018-04-09 DIAGNOSIS — R072 Precordial pain: Secondary | ICD-10-CM

## 2018-04-09 NOTE — Progress Notes (Signed)
Chief Complaint  Patient presents with  . New Patient (Initial Visit)   History of Present Illness: 73 yo female with history of chronic pain syndrome, diabetes mellitus, hypothyroidism, arthritis and HTN here today as a new patient for the evaluation of chest pain. She has no known heart disease. She tells me today that she has chest pressure when she is laying in bed at night. This is a heaviness in the center of her chest. This does not radiate. No worsening with exertion. No associated dyspnea. No lower extremity edema. She has never been a smoker.   Primary Care Physician: [name], PA-C  Past Medical History:  Diagnosis Date  . Arthritis    degenerative lumbar spine, back injection with Dr. Sherene Sires office    . Chronic back pain    stenosis  . Closed displaced fracture of base of neck of left femur (HCC)   . Closed left subtrochanteric femur fracture (HCC) 05/20/2017  . Complication of anesthesia    woke up very slowly, states she was told that " we had a time waking me you up"  . Constipation    takes Stool Softener daily  . Diabetes mellitus without complication (HCC)    takes Amaryl and Metformin daily  . Diverticulitis   . DJD (degenerative joint disease) of knee 05/19/2014  . Fall   . Gout    takes Allopurinol daily  . History of blood transfusion    no abnormal reaction noted  . Hypertension    takes Lisinopril-HCTZ daily  . Joint pain   . Lumbar pain with radiation down right leg   . Nocturia   . Osteoporosis 05/23/2017  . Precordial chest pain 05/09/2014  . Primary localized osteoarthritis of left knee   . Primary localized osteoarthritis of right knee   . Restless leg syndrome    takes Lyrica daily  . S/P lumbar laminectomy 03/12/2015  . Urinary frequency   . Weakness    numbness and tingling in both legs  . Wound dehiscence     Past Surgical History:  Procedure Laterality Date  . Bladder Tacking  1981  . CHOLECYSTECTOMY  1978   [facility].   .  ESOPHAGOGASTRODUODENOSCOPY    . EYE SURGERY     cataracts removed bilateral /w IOL  . HIP ARTHROPLASTY Left 05/20/2017   Procedure: ARTHROPLASTY BIPOLAR HIP (HEMIARTHROPLASTY);  Surgeon: Bjorn Pippin, MD;  Location: [facility] OR;  Service: Orthopedics;  Laterality: Left;  . LUMBAR LAMINECTOMY  04/21/2015  . LUMBAR LAMINECTOMY/DECOMPRESSION MICRODISCECTOMY Bilateral 03/12/2015   Procedure: Bilateral Lumbar Three-Four, Lumbar Four-Five Laminectomy and Foraminotomy ;  Surgeon: Tia Alert, MD;  Location: [facility];  Service: Neurosurgery;  Laterality: Bilateral;  . LUMBAR WOUND DEBRIDEMENT N/A 04/21/2015   Procedure: Lumbar wound revision;  Surgeon: Tia Alert, MD;  Location: [facility];  Service: Neurosurgery;  Laterality: N/A;  . PARTIAL HYSTERECTOMY  1998   Partial  . TOTAL KNEE ARTHROPLASTY Left 05/19/2014   dr Thurston Hole  . TOTAL KNEE ARTHROPLASTY Left 05/19/2014   Procedure: LEFT TOTAL KNEE ARTHROPLASTY;  Surgeon: Nilda Simmer, MD;  Location: [facility] OR;  Service: Orthopedics;  Laterality: Left;  . TUBAL LIGATION Bilateral 1980  . VARICOSE VEIN SURGERY      Current Outpatient Medications  Medication Sig Dispense Refill  . Cholecalciferol (VITAMIN D) 2000 UNITS CAPS Take 5,000 Units by mouth daily.     . diclofenac sodium (VOLTAREN) 1 % GEL Apply 2 g topically 4 (four)  times daily as needed (knee pain).    Marland Kitchen glimepiride (AMARYL) 1 MG tablet Take 1 mg by mouth daily with breakfast.    . levothyroxine (SYNTHROID, LEVOTHROID) 25 MCG tablet Take 25 mcg by mouth daily before breakfast.    . lisinopril (PRINIVIL,ZESTRIL) 5 MG tablet Take 5 mg by mouth daily.    . meloxicam (MOBIC) 7.5 MG tablet Take 1 tablet (7.5 mg total) by mouth daily. 30 tablet 2  . metFORMIN (GLUCOPHAGE) 500 MG tablet Take 500 mg by mouth 2 (two) times daily with a meal.    . mirabegron ER (MYRBETRIQ) 25 MG TB24 tablet Take 25 mg by mouth daily.    Marland Kitchen oxyCODONE (ROXICODONE) 15 MG immediate release tablet Take 15 mg by mouth  every 6 (six) hours as needed (pain).     . pregabalin (LYRICA) 200 MG capsule Take 200 mg by mouth 3 (three) times daily.     Marland Kitchen rOPINIRole (REQUIP) 3 MG tablet Take 3 mg by mouth at bedtime.     . vitamin B-12 (CYANOCOBALAMIN) 1000 MCG tablet Take 1,000 mcg by mouth daily.     No current facility-administered medications for this visit.     Allergies  Allergen Reactions  . Penicillins Hives    Has patient had a PCN reaction causing immediate rash, facial/tongue/throat swelling, SOB or lightheadedness with hypotension: Yes/No Has patient had a PCN reaction causing severe rash involving mucus membranes or skin necrosis: Yes/No Has patient had a PCN reaction that required hospitalization Yes/No Has patient had a PCN reaction occurring within the last 10 years: Yes/No If all of the above answers are "NO", then may proceed with Cephalosporin use.     Social History   Socioeconomic History  . Marital status: Married    Spouse name: Not on file  . Number of children: 4  . Years of education: Not on file  . Highest education level: Not on file  Occupational History  . Occupation: Estate manager/land agent company  Social Needs  . Financial resource strain: Not on file  . Food insecurity:    Worry: Not on file    Inability: Not on file  . Transportation needs:    Medical: Not on file    Non-medical: Not on file  Tobacco Use  . Smoking status: Never Smoker  . Smokeless tobacco: Never Used  Substance and Sexual Activity  . Alcohol use: No  . Drug use: No  . Sexual activity: Never  Lifestyle  . Physical activity:    Days per week: Not on file    Minutes per session: Not on file  . Stress: Not on file  Relationships  . Social connections:    Talks on phone: Not on file    Gets together: Not on file    Attends religious service: Not on file    Active member of club or organization: Not on file    Attends meetings of clubs or organizations: Not on file    Relationship  status: Not on file  . Intimate partner violence:    Fear of current or ex partner: Not on file    Emotionally abused: Not on file    Physically abused: Not on file    Forced sexual activity: Not on file  Other Topics Concern  . Not on file  Social History Narrative   Lives at home with husband and daughter.      Family History  Problem Relation Age of Onset  . Stroke Mother   .  Heart attack Mother   . Heart disease Father   . Anxiety disorder Father   . CAD Father   . Depression Father   . Osteoporosis Father   . Congestive Heart Failure Father   . Hypertension Brother   . Diabetes Brother   . CAD Paternal Grandfather   . Diabetes type II Paternal Grandfather   . Hypertension Paternal Grandfather   . Osteoporosis Paternal Grandfather   . Congenital heart disease Paternal Grandfather     Review of Systems:  As stated in the HPI and otherwise negative.   BP 130/78   Pulse 69   Ht 5\' 3"  (1.6 m)   Wt 197 lb (89.4 kg)   SpO2 99%   BMI 34.90 kg/m   Physical Examination: General: Well developed, well nourished, NAD  HEENT: OP clear, mucus membranes moist  SKIN: warm, dry. No rashes. Neuro: No focal deficits  Musculoskeletal: Muscle strength 5/5 all ext  Psychiatric: Mood and affect normal  Neck: No JVD, no carotid bruits, no thyromegaly, no lymphadenopathy.  Lungs:Clear bilaterally, no wheezes, rhonci, crackles Cardiovascular: Regular rate and rhythm. No murmurs, gallops or rubs. Abdomen:Soft. Bowel sounds present. Non-tender.  Extremities: No lower extremity edema. Pulses are 2 + in the bilateral DP/PT.  EKG:  EKG is ordered today. The ekg ordered today demonstrates NSR, rate 69 bpm.   Recent Labs: 05/22/2017: BUN 16; Creatinine, Ser 0.75; Hemoglobin 11.4; Platelets 163; Potassium 3.7; Sodium 139   Lipid Panel    Component Value Date/Time   CHOL  10/04/2009 1013    122        ATP III CLASSIFICATION:  <200     mg/dL   Desirable  161-096200-239  mg/dL   Borderline  High  >=045>=240    mg/dL   High          TRIG 56 10/04/2009 1013   HDL 47 10/04/2009 1013   CHOLHDL 2.6 10/04/2009 1013   VLDL 11 10/04/2009 1013   LDLCALC  10/04/2009 1013    64        Total Cholesterol/HDL:CHD Risk Coronary Heart Disease Risk Table                     Men   Women  1/2 Average Risk   3.4   3.3  Average Risk       5.0   4.4  2 X Average Risk   9.6   7.1  3 X Average Risk  23.4   11.0        Use the calculated Patient Ratio above and the CHD Risk Table to determine the patient's CHD Risk.        ATP III CLASSIFICATION (LDL):  <100     mg/dL   Optimal  [id number]  mg/dL   Near or Above                    Optimal  130-159  mg/dL   Borderline  [id number]  mg/dL   High  >956>190     mg/dL   Very High     Wt Readings from Last 3 Encounters:  04/09/18 197 lb (89.4 kg)  05/20/17 199 lb (90.3 kg)  05/02/16 224 lb (101.6 kg)     Other studies Reviewed: Additional studies/ records that were reviewed today include:  Review of the above records demonstrates:    Assessment and Plan:   1. Chest pain: Her pain occurs mostly at rest. Mostly atypical features  but given risk factors for CAD including DM, advanced age, HTN and FH of CAD, I will arrange an exercise nuclear stress test to exclude ischemia. I will arrange an echo to assess LV systolic function and exclude structural heart disease.   Current medicines are reviewed at length with the patient today.  The patient does not have concerns regarding medicines.  The following changes have been made:  no change  Labs/ tests ordered today include:   Orders Placed This Encounter  Procedures  . MYOCARDIAL PERFUSION IMAGING  . EKG 12-Lead  . ECHOCARDIOGRAM COMPLETE     Disposition:   FU with me in one year.    Signed, Verne Carrowhristopher McAlhany, MD 04/09/2018 10:35 AM    [facility] [address], [place], [place]  [address] Phone: [phone]; Fax: [phone]

## 2018-04-09 NOTE — Patient Instructions (Signed)
Medication Instructions:  Your physician recommends that you continue on your current medications as directed. Please refer to the Current Medication list given to you today.  If you need a refill on your cardiac medications before your next appointment, please call your pharmacy.   Lab work: none If you have labs (blood work) drawn today and your tests are completely normal, you will receive your results only by: Marland Kitchen MyChart Message (if you have MyChart) OR . A paper copy in the mail If you have any lab test that is abnormal or we need to change your treatment, we will call you to review the results.  Testing/Procedures: Your physician has requested that you have an echocardiogram. Echocardiography is a painless test that uses sound waves to create images of your heart. It provides your doctor with information about the size and shape of your heart and how well your heart's chambers and valves are working. This procedure takes approximately one hour. There are no restrictions for this procedure.  Your physician has requested that you have en exercise stress myoview. For further information please visit [email or web address] Please follow instruction sheet, as given.   Follow-Up: At [facility], you and your health needs are our priority.  As part of our continuing mission to provide you with exceptional heart care, we have created designated Provider Care Teams.  These Care Teams include your primary Cardiologist (physician) and Advanced Practice Providers (APPs -  Physician Assistants and Nurse Practitioners) who all work together to provide you with the care you need, when you need it. You will need a follow up appointment in 12 months.  Please call our office 2 months in advance to schedule this appointment.  You may see Dr. Clifton James or one of the following Advanced Practice Providers on your designated Care Team:   Sherrill, PA-C Ronie Spies, PA-C . Jacolyn Reedy, PA-C  Any  Other Special Instructions Will Be Listed Below (If Applicable).

## 2018-04-12 ENCOUNTER — Telehealth (HOSPITAL_COMMUNITY): Payer: Self-pay | Admitting: *Deleted

## 2018-04-12 NOTE — Telephone Encounter (Signed)
Patient given detailed instructions per Myocardial Perfusion Study Information Sheet for the test on 04/17/17 Patient notified to arrive 15 minutes early and that it is imperative to arrive on time for appointment to keep from having the test rescheduled.  If you need to cancel or reschedule your appointment, please call the office within 24 hours of your appointment. . Patient verbalized understanding. Real Cona Jacqueline    

## 2018-04-17 ENCOUNTER — Encounter (HOSPITAL_COMMUNITY): Payer: Medicare Other

## 2018-04-17 ENCOUNTER — Other Ambulatory Visit (HOSPITAL_COMMUNITY): Payer: Medicare Other

## 2019-06-03 ENCOUNTER — Telehealth: Payer: Self-pay | Admitting: Cardiovascular Disease

## 2019-06-03 NOTE — Progress Notes (Addendum)
Virtual Visit via Video Note   This visit type was conducted due to national recommendations for restrictions regarding the COVID-19 Pandemic (e.g. social distancing) in an effort to limit this patient's exposure and mitigate transmission in our community.  Due to her co-morbid illnesses, this patient is at least at moderate risk for complications without adequate follow up.  This format is felt to be most appropriate for this patient at this time.  All issues noted in this document were discussed and addressed.  A limited physical exam was performed with this format.  Please refer to the patient's chart for her consent to telehealth for [facility].   Date:  06/05/2019   ID:  Amber Ashley, DOB 10-29-1945, MRN [id number]  Patient Location: Home Provider Location: Home  PCP:  Dustin Flock, PA-C  Cardiologist:  Lauree Chandler, MD   Electrophysiologist:  None   Evaluation Performed:  Follow-Up Visit  Chief Complaint:  Jaw and chest pain  History of Present Illness:    Amber Ashley is a 74 y.o. female with history of hypertension, DM, chronic pain syndrome, hypothyroidism who saw Dr. Angelena Form 04/09/2018 for evaluation of chest pain night.  Echocardiogram and nuclear stress test were ordered but never done.  Patient called in yesterday complaining of pain in her right jawbone going down her right neck and into her right arm and chest.  Patient complains of hurting behind right ear, down her right arm and then into her chest-feels like a ball in her chest pressing in. Takes a baby ASA and lays down and it goes away in 10-15 min. Occurs at rest.She feels weak,no shortness of breath, dizziness or presyncope. Woke up Sat with hurting in her left breast into her back that lasted all day and then went away. No symptoms when cleaning house or activity. Last episode was last Thurs. Brother has heart trouble 42's, another brother with heart trouble 78. Trying to get scheduled for covid19  vaccine.  The patient does not have symptoms concerning for COVID-19 infection (fever, chills, cough, or new shortness of breath).    Past Medical History:  Diagnosis Date  . Arthritis    degenerative lumbar spine, back injection with Dr. Archie Endo office    . Chronic back pain    stenosis  . Closed displaced fracture of base of neck of left femur ([place])   . Closed left subtrochanteric femur fracture ([place]) 05/20/2017  . Complication of anesthesia    woke up very slowly, states she was told that " we had a time waking me you up"  . Constipation    takes Stool Softener daily  . Diabetes mellitus without complication ([place])    takes Amaryl and Metformin daily  . Diverticulitis   . DJD (degenerative joint disease) of knee 05/19/2014  . Fall   . Gout    takes Allopurinol daily  . History of blood transfusion    no abnormal reaction noted  . Hypertension    takes Lisinopril-HCTZ daily  . Joint pain   . Lumbar pain with radiation down right leg   . Nocturia   . Osteoporosis 05/23/2017  . Precordial chest pain 05/09/2014  . Primary localized osteoarthritis of left knee   . Primary localized osteoarthritis of right knee   . Restless leg syndrome    takes Lyrica daily  . S/P lumbar laminectomy 03/12/2015  . Urinary frequency   . Weakness    numbness and tingling in both legs  . Wound dehiscence  Past Surgical History:  Procedure Laterality Date  . Bladder Tacking  1981  . CHOLECYSTECTOMY  1978   [facility].   . ESOPHAGOGASTRODUODENOSCOPY    . EYE SURGERY     cataracts removed bilateral /w IOL  . HIP ARTHROPLASTY Left 05/20/2017   Procedure: ARTHROPLASTY BIPOLAR HIP (HEMIARTHROPLASTY);  Surgeon: Bjorn Pippin, MD;  Location: [facility] OR;  Service: Orthopedics;  Laterality: Left;  . LUMBAR LAMINECTOMY  04/21/2015  . LUMBAR LAMINECTOMY/DECOMPRESSION MICRODISCECTOMY Bilateral 03/12/2015   Procedure: Bilateral Lumbar Three-Four, Lumbar Four-Five Laminectomy and Foraminotomy ;  Surgeon:  Tia Alert, MD;  Location: [facility];  Service: Neurosurgery;  Laterality: Bilateral;  . LUMBAR WOUND DEBRIDEMENT N/A 04/21/2015   Procedure: Lumbar wound revision;  Surgeon: Tia Alert, MD;  Location: [facility];  Service: Neurosurgery;  Laterality: N/A;  . PARTIAL HYSTERECTOMY  1998   Partial  . TOTAL KNEE ARTHROPLASTY Left 05/19/2014   dr Thurston Hole  . TOTAL KNEE ARTHROPLASTY Left 05/19/2014   Procedure: LEFT TOTAL KNEE ARTHROPLASTY;  Surgeon: Nilda Simmer, MD;  Location: [facility] OR;  Service: Orthopedics;  Laterality: Left;  . TUBAL LIGATION Bilateral 1980  . VARICOSE VEIN SURGERY       Current Meds  Medication Sig  . allopurinol (ZYLOPRIM) 300 MG tablet Take 1 tablet by mouth daily.  Marland Kitchen atorvastatin (LIPITOR) 10 MG tablet Take 1 tablet by mouth daily.  . Cholecalciferol (VITAMIN D) 2000 UNITS CAPS Take 5,000 Units by mouth daily.   . cyclobenzaprine (FLEXERIL) 10 MG tablet Take 1 tablet by mouth in the morning and at bedtime.  . diclofenac sodium (VOLTAREN) 1 % GEL Apply 2 g topically 4 (four) times daily as needed (knee pain).  Marland Kitchen glimepiride (AMARYL) 1 MG tablet Take 1 mg by mouth daily with breakfast.  . levothyroxine (SYNTHROID, LEVOTHROID) 25 MCG tablet Take 25 mcg by mouth daily before breakfast.  . lisinopril (PRINIVIL,ZESTRIL) 5 MG tablet Take 5 mg by mouth daily.  . meloxicam (MOBIC) 15 MG tablet Take 1 tablet by mouth daily.  . metFORMIN (GLUCOPHAGE) 500 MG tablet Take 500 mg by mouth 2 (two) times daily with a meal.  . mirabegron ER (MYRBETRIQ) 25 MG TB24 tablet Take 25 mg by mouth daily.  Marland Kitchen oxyCODONE (ROXICODONE) 15 MG immediate release tablet Take 15 mg by mouth every 6 (six) hours as needed (pain).   . pregabalin (LYRICA) 200 MG capsule Take 200 mg by mouth 3 (three) times daily.   Marland Kitchen rOPINIRole (REQUIP) 3 MG tablet Take 3 mg by mouth at bedtime.   . vitamin B-12 (CYANOCOBALAMIN) 1000 MCG tablet Take 1,000 mcg by mouth daily.     Allergies:   Penicillins   Social  History   Tobacco Use  . Smoking status: Never Smoker  . Smokeless tobacco: Never Used  Substance Use Topics  . Alcohol use: No  . Drug use: No     Family Hx: The patient's family history includes Anxiety disorder in her father; CAD in her father and paternal grandfather; Congenital heart disease in her paternal grandfather; Congestive Heart Failure in her father; Depression in her father; Diabetes in her brother; Diabetes type II in her paternal grandfather; Heart attack in her mother; Heart disease in her father; Hypertension in her brother and paternal grandfather; Osteoporosis in her father and paternal grandfather; Stroke in her mother.  ROS:   Please see the history of present illness.      All other systems reviewed and are negative.  Prior CV studies:   The following studies were reviewed today:     Labs/Other Tests and Data Reviewed:    EKG:  An ECG dated 04/09/18 was personally reviewed today and demonstrated:  NSR low voltage   Addendum: EKG done in office today NSR with low voltage unchanged from last year.  Recent Labs: No results found for requested labs within last 8760 hours.   Recent Lipid Panel Lab Results  Component Value Date/Time   CHOL  10/04/2009 10:13 AM    122        ATP III CLASSIFICATION:  <200     mg/dL   Desirable  244-010  mg/dL   Borderline High  >=272    mg/dL   High          TRIG 56 10/04/2009 10:13 AM   HDL 47 10/04/2009 10:13 AM   CHOLHDL 2.6 10/04/2009 10:13 AM   LDLCALC  10/04/2009 10:13 AM    64        Total Cholesterol/HDL:CHD Risk Coronary Heart Disease Risk Table                     Men   Women  1/2 Average Risk   3.4   3.3  Average Risk       5.0   4.4  2 X Average Risk   9.6   7.1  3 X Average Risk  23.4   11.0        Use the calculated Patient Ratio above and the CHD Risk Table to determine the patient's CHD Risk.        ATP III CLASSIFICATION (LDL):  <100     mg/dL   Optimal  536-644  mg/dL   Near or Above                     Optimal  130-159  mg/dL   Borderline  034-742  mg/dL   High  >595     mg/dL   Very High    Wt Readings from Last 3 Encounters:  06/05/19 219 lb (99.3 kg)  04/09/18 197 lb (89.4 kg)  05/20/17 199 lb (90.3 kg)     Objective:    Vital Signs:  Pulse 91   Ht 5\' 3"  (1.6 m)   Wt 219 lb (99.3 kg)   SpO2 100%   BMI 38.79 kg/m    VITAL SIGNS:  reviewed GEN:  no acute distress RESPIRATORY:  normal respiratory effort, symmetric expansion CARDIOVASCULAR:  no peripheral edema  ASSESSMENT & PLAN:    History of chest pain felt to be atypical in the past stress test and echo ordered last year but not done.  Now having right jaw pain down her right arm into chest-occurs at rest and lasts 10-15 min eased with baby ASA and laying down. Last episode last Thurs. And has had 4-5 episodes total in past several months. No exertional symptoms. Will have her come to the office for EKG. If unchanged proceed with lexiscan and echo. F/u with me after.  Essential hypertension-could get her BP machine to work today but has been controlled. Check labs if hasn't had with PCP recently.  Diabetes mellitus managed by PCP and on a statin  Chronic pain syndrome-mostly in her knees and legs.  COVID-19 Education: The signs and symptoms of COVID-19 were discussed with the patient and how to seek care for testing (follow up with PCP or arrange E-visit).   The importance of  social distancing was discussed today.  Time:   Today, I have spent 13 minutes with the patient with telehealth technology discussing the above problems.     Medication Adjustments/Labs and Tests Ordered: Current medicines are reviewed at length with the patient today.  Concerns regarding medicines are outlined above.   Tests Ordered: Orders Placed This Encounter  Procedures  . MYOCARDIAL PERFUSION IMAGING  . ECHOCARDIOGRAM COMPLETE    Medication Changes: No orders of the defined types were placed in this  encounter.   Follow Up:  Either In Person or Virtual in 2 week(s) Jacolyn Reedy PA-C  Signed, Jacolyn Reedy, PA-C  06/05/2019 10:54 AM    [facility]

## 2019-06-03 NOTE — Telephone Encounter (Signed)
New message   Pt c/o of Chest Pain: STAT if CP now or developed within 24 hours  1. Are you having CP right now? No   2. Are you experiencing any other symptoms (ex. SOB, nausea, vomiting, sweating)? Pain in jawbone on right side   3. How long have you been experiencing CP? Per patient about a month  4. Is your CP continuous or coming and going? Going and coming   5. Have you taken Nitroglycerin? No  ?

## 2019-06-03 NOTE — Telephone Encounter (Signed)
Lm to call back ./cy 

## 2019-06-03 NOTE — Telephone Encounter (Signed)
Follow up   Patient returning call. Please call [phone].

## 2019-06-03 NOTE — Telephone Encounter (Signed)
I spoke with patient. She reports for the last 4-6 weeks she has been having pain in right jawbone that goes down right neck, into right arm and to chest. Also, some pain in right ear. Saw Dr Clifton James as a new patient in January 2020. Patient states pain she was having at that time was different than what she is having now. Echo and stress test were ordered but patient had to cancel these tests. She is not sure how often she has right sided pain but last time was last Thursday. She took 2 baby ASA and this helped. No shortness of breath. Has very little energy. Did have one episode of pain under left breast but she thinks this may have been due to the way she was laying. Has not happened again. I scheduled patient for my chart video visit with Jacolyn Reedy, PA on March 3,2021 at 10:45.

## 2019-06-05 ENCOUNTER — Encounter: Payer: Self-pay | Admitting: Physician Assistant

## 2019-06-05 ENCOUNTER — Ambulatory Visit: Payer: Medicare Other

## 2019-06-05 ENCOUNTER — Other Ambulatory Visit: Payer: Self-pay

## 2019-06-05 ENCOUNTER — Telehealth (INDEPENDENT_AMBULATORY_CARE_PROVIDER_SITE_OTHER): Payer: Medicare Other | Admitting: Physician Assistant

## 2019-06-05 VITALS — HR 91 | Ht 63.0 in | Wt 219.0 lb

## 2019-06-05 VITALS — HR 86 | Ht 63.0 in | Wt 219.0 lb

## 2019-06-05 DIAGNOSIS — Z8739 Personal history of other diseases of the musculoskeletal system and connective tissue: Secondary | ICD-10-CM

## 2019-06-05 DIAGNOSIS — E119 Type 2 diabetes mellitus without complications: Secondary | ICD-10-CM | POA: Diagnosis not present

## 2019-06-05 DIAGNOSIS — R072 Precordial pain: Secondary | ICD-10-CM

## 2019-06-05 DIAGNOSIS — I10 Essential (primary) hypertension: Secondary | ICD-10-CM | POA: Diagnosis not present

## 2019-06-05 DIAGNOSIS — G894 Chronic pain syndrome: Secondary | ICD-10-CM | POA: Diagnosis not present

## 2019-06-05 NOTE — Progress Notes (Signed)
1.) Reason for visit:EKG  2.) Name of MD requesting visit:Michelle Lenze  3.) H&P: Pt with a history of chest pain.  4.) ROS related to problem: Pt has a history of chest pain, EKG to assess. Pt has not gotten her Echocardiogram/stress test   5.) Assessment and plan per MD: Taken to DOD Dr. Katrinka Blazing. Sent to Leda Gauze for review.

## 2019-06-05 NOTE — Patient Instructions (Signed)
Medication Instructions:  Your physician recommends that you continue on your current medications as directed. Please refer to the Current Medication list given to you today.  *If you need a refill on your cardiac medications before your next appointment, please call your pharmacy*   Lab Work: Please have your lab work drawn with your PCP faxed over to [place]  If you have labs (blood work) drawn today and your tests are completely normal, you will receive your results only by: Marland Kitchen MyChart Message (if you have MyChart) OR . A paper copy in the mail If you have any lab test that is abnormal or we need to change your treatment, we will call you to review the results.   Testing/Procedures: Your physician has requested that you have an echocardiogram. Echocardiography is a painless test that uses sound waves to create images of your heart. It provides your doctor with information about the size and shape of your heart and how well your heart's chambers and valves are working. This procedure takes approximately one hour. There are no restrictions for this procedure.  Your physician has requested that you have a lexiscan myoview. For further information please visit [email or web address] Please follow instruction sheet, as given.   Follow-Up: Your physician recommends that you come in for a Nurse Visit EKG TODAY at 2:00 PM  Follow up with Jacolyn Reedy, PA via VIRTUAL Visit on 06/26/19 at 10:45 AM    Other Instructions

## 2019-06-10 ENCOUNTER — Other Ambulatory Visit (INDEPENDENT_AMBULATORY_CARE_PROVIDER_SITE_OTHER): Payer: Medicare Other

## 2019-06-10 DIAGNOSIS — R072 Precordial pain: Secondary | ICD-10-CM

## 2019-06-11 NOTE — Addendum Note (Signed)
Addended by: Daleen Bo I on: 06/11/2019 11:20 AM   Modules accepted: Orders

## 2019-06-18 ENCOUNTER — Encounter (HOSPITAL_COMMUNITY): Payer: Self-pay | Admitting: *Deleted

## 2019-06-19 ENCOUNTER — Encounter (HOSPITAL_COMMUNITY): Payer: Self-pay | Admitting: *Deleted

## 2019-06-19 ENCOUNTER — Telehealth (HOSPITAL_COMMUNITY): Payer: Self-pay | Admitting: *Deleted

## 2019-06-19 NOTE — Telephone Encounter (Signed)
Sent instructions for Myoview testing scheduled for 06/24/19 @ 7:15 to patients My Chart. Daneil Dolin

## 2019-06-20 NOTE — Telephone Encounter (Signed)
Patient states she is returning call for instructions for Myoview testing scheduled for 06/24/19 at 7:15 AM. She states she is not able to acces instructions in MyChart. Please return call to assist.

## 2019-06-21 ENCOUNTER — Telehealth (HOSPITAL_COMMUNITY): Payer: Self-pay | Admitting: *Deleted

## 2019-06-21 NOTE — Telephone Encounter (Signed)
Attempted to return patient's call regarding stress test on Monday 06/23/17.  Tried three different numbers.  No answer. Daneil Dolin

## 2019-06-24 ENCOUNTER — Ambulatory Visit (HOSPITAL_BASED_OUTPATIENT_CLINIC_OR_DEPARTMENT_OTHER): Payer: Medicare Other

## 2019-06-24 ENCOUNTER — Ambulatory Visit (HOSPITAL_COMMUNITY): Payer: Medicare Other | Attending: Cardiovascular Disease

## 2019-06-24 ENCOUNTER — Other Ambulatory Visit: Payer: Self-pay

## 2019-06-24 DIAGNOSIS — R072 Precordial pain: Secondary | ICD-10-CM | POA: Diagnosis not present

## 2019-06-24 LAB — ECHOCARDIOGRAM COMPLETE
Height: 63 in
Weight: 3504 oz

## 2019-06-24 MED ORDER — PERFLUTREN LIPID MICROSPHERE
1.0000 mL | INTRAVENOUS | Status: AC | PRN
  Administered 2019-06-24: 2 mL via INTRAVENOUS

## 2019-06-24 MED ORDER — TECHNETIUM TC 99M TETROFOSMIN IV KIT
31.8000 | PACK | Freq: Once | INTRAVENOUS | Status: AC | PRN
  Administered 2019-06-24: 31.8 via INTRAVENOUS
  Filled 2019-06-24: qty 32

## 2019-06-24 NOTE — Progress Notes (Signed)
Virtual Visit via Video Note   This visit type was conducted due to national recommendations for restrictions regarding the COVID-19 Pandemic (e.g. social distancing) in an effort to limit this patient's exposure and mitigate transmission in our community.  Due to her co-morbid illnesses, this patient is at least at moderate risk for complications without adequate follow up.  This format is felt to be most appropriate for this patient at this time.  All issues noted in this document were discussed and addressed.  A limited physical exam was performed with this format.  Please refer to the patient's chart for her consent to telehealth for [facility].   The patient was identified using 2 identifiers.  Date:  06/26/2019   ID:  Amber Ashley, DOB 1945-06-12, MRN [id number]  Patient Location: Home Provider Location: Home  PCP:  [name], PA-C  Cardiologist:  Verne Carrow, MD   Electrophysiologist:  None   Evaluation Performed:  Follow-Up Visit  Chief Complaint:  f/u  History of Present Illness:    Amber Ashley is a 74 y.o. female with  history of hypertension, DM, chronic pain syndrome, hypothyroidism who saw Dr. Clifton James 04/09/2018 for evaluation of chest pain night.  Echocardiogram and nuclear stress test were ordered but never done.   I had a video visit with the patient 06/05/2019 complaining of right jaw pain down her right arm and into her chest easing with baby aspirin and rest.  I had her come to the office for an EKG which was unchanged so we will schedule a Lexiscan and echo 06/24/2019.  Lexiscan was overall low risk but could not rule out a small area of anterior apical ischemia. Echo normal LVEF 60-65% grade 1 DD.  Video had to be converted to phone visit as she couldn't get her sound to work.Patient hasn't had anymore jaw pain into her chest. She is under a lot of stress-son, his wife and 4 kids moved in with them. Tired all the time. No regular exercise. Has  had hip and knee surgery so having pain from that. Received first covid19 vaccine last week. BP up today. She hasn't been checking her BP.Having pain in her legs and has spots. 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 She's had stripping done in the past.  The patient does not have symptoms concerning for COVID-19 infection (fever, chills, cough, or new shortness of breath).    Past Medical History:  Diagnosis Date  . Arthritis    degenerative lumbar spine, back injection with Dr. Sherene Sires office    . Chronic back pain    stenosis  . Closed displaced fracture of base of neck of left femur (HCC)   . Closed left subtrochanteric femur fracture (HCC) 05/20/2017  . Complication of anesthesia    woke up very slowly, states she was told that " we had a time waking me you up"  . Constipation    takes Stool Softener daily  . Diabetes mellitus without complication (HCC)    takes Amaryl and Metformin daily  . Diverticulitis   . DJD (degenerative joint disease) of knee 05/19/2014  . Fall   . Gout    takes Allopurinol daily  . History of blood transfusion    no abnormal reaction noted  . Hypertension    takes Lisinopril-HCTZ daily  . Joint pain   . Lumbar pain with radiation down right leg   . Nocturia   . Osteoporosis 05/23/2017  . Precordial  chest pain 05/09/2014  . Primary localized osteoarthritis of left knee   . Primary localized osteoarthritis of right knee   . Restless leg syndrome    takes Lyrica daily  . S/P lumbar laminectomy 03/12/2015  . Urinary frequency   . Weakness    numbness and tingling in both legs  . Wound dehiscence    Past Surgical History:  Procedure Laterality Date  . Bladder Tacking  1981  . [place].   . ESOPHAGOGASTRODUODENOSCOPY    . EYE SURGERY     cataracts removed bilateral /w IOL  . HIP ARTHROPLASTY Left 05/20/2017   Procedure: ARTHROPLASTY BIPOLAR HIP (HEMIARTHROPLASTY);  Surgeon: Hiram Gash, MD;   Location: [place];  Service: Orthopedics;  Laterality: Left;  . LUMBAR LAMINECTOMY  04/21/2015  . LUMBAR LAMINECTOMY/DECOMPRESSION MICRODISCECTOMY Bilateral 03/12/2015   Procedure: Bilateral Lumbar Three-Four, Lumbar Four-Five Laminectomy and Foraminotomy ;  Surgeon: Eustace Moore, MD;  Location: [place] [facility];  Service: Neurosurgery;  Laterality: Bilateral;  . LUMBAR WOUND DEBRIDEMENT N/A 04/21/2015   Procedure: Lumbar wound revision;  Surgeon: Eustace Moore, MD;  Location: [place] [facility];  Service: Neurosurgery;  Laterality: N/A;  . PARTIAL HYSTERECTOMY  1998   Partial  . TOTAL KNEE ARTHROPLASTY Left 05/19/2014   dr Noemi Chapel  . TOTAL KNEE ARTHROPLASTY Left 05/19/2014   Procedure: LEFT TOTAL KNEE ARTHROPLASTY;  Surgeon: Lorn Junes, MD;  Location: [place];  Service: Orthopedics;  Laterality: Left;  . TUBAL LIGATION Bilateral 1980  . VARICOSE VEIN SURGERY       Current Meds  Medication Sig  . allopurinol (ZYLOPRIM) 300 MG tablet Take 1 tablet by mouth daily.  Marland Kitchen atorvastatin (LIPITOR) 10 MG tablet Take 1 tablet by mouth daily.  . Cholecalciferol (VITAMIN D) 2000 UNITS CAPS Take 5,000 Units by mouth daily.   . cyclobenzaprine (FLEXERIL) 10 MG tablet Take 1 tablet by mouth in the morning and at bedtime.  . diclofenac sodium (VOLTAREN) 1 % GEL Apply 2 g topically 4 (four) times daily as needed (knee pain).  Marland Kitchen glimepiride (AMARYL) 1 MG tablet Take 1 mg by mouth daily with breakfast.  . levothyroxine (SYNTHROID, LEVOTHROID) 25 MCG tablet Take 25 mcg by mouth daily before breakfast.  . lisinopril (PRINIVIL,ZESTRIL) 5 MG tablet Take 5 mg by mouth daily.  . meloxicam (MOBIC) 15 MG tablet Take 1 tablet by mouth daily.  . metFORMIN (GLUCOPHAGE) 500 MG tablet Take 500 mg by mouth 2 (two) times daily with a meal.  . mirabegron ER (MYRBETRIQ) 25 MG TB24 tablet Take 25 mg by mouth daily.  Marland Kitchen oxyCODONE (ROXICODONE) 15 MG immediate release tablet Take 15 mg by mouth every 6 (six) hours as needed (pain).   .  pregabalin (LYRICA) 200 MG capsule Take 200 mg by mouth 3 (three) times daily.   Marland Kitchen rOPINIRole (REQUIP) 3 MG tablet Take 3 mg by mouth at bedtime.   . vitamin B-12 (CYANOCOBALAMIN) 1000 MCG tablet Take 1,000 mcg by mouth daily.     Allergies:   Penicillins   Social History   Tobacco Use  . Smoking status: Never Smoker  . Smokeless tobacco: Never Used  Substance Use Topics  . Alcohol use: No  . Drug use: No     Family Hx: The patient's family history includes Anxiety disorder in her father; CAD in her father and paternal grandfather; Congenital heart disease in her paternal grandfather; Congestive Heart Failure in her father; Depression in her father; Diabetes in  her brother; Diabetes type II in her paternal grandfather; Heart attack in her mother; Heart disease in her father; Hypertension in her brother and paternal grandfather; Osteoporosis in her father and paternal grandfather; Stroke in her mother.  ROS:   Please see the history of present illness.      All other systems reviewed and are negative.   Prior CV studies:   The following studies were reviewed today:  2D echo 06/24/2019 IMPRESSIONS     1. Left ventricular ejection fraction, by estimation, is 60 to 65%. The  left ventricle has normal function. The left ventricle has no regional  wall motion abnormalities. There is mild concentric left ventricular  hypertrophy. Left ventricular diastolic  parameters are consistent with Grade I diastolic dysfunction (impaired  relaxation).   2. Right ventricular systolic function is normal. The right ventricular  size is normal.   3. The mitral valve is normal in structure. No evidence of mitral valve  regurgitation. No evidence of mitral stenosis.   4. The aortic valve is normal in structure. Aortic valve regurgitation is  not visualized. No aortic stenosis is present.   FINDINGS   Left Ventricle: Left ventricular ejection fraction, by estimation, is 60  to 65%. The left  ventricle has normal function. The left ventricle has no  regional wall motion abnormalities. The left ventricular internal cavity  size was normal in size. There is   mild concentric left ventricular hypertrophy. Left ventricular diastolic  parameters are consistent with Grade I diastolic dysfunction (impaired  relaxation).   Right Ventricle: The right ventricular size is normal. No increase in  right ventricular wall thickness. Right ventricular systolic function is  normal.   Left Atrium: Left atrial size was normal in size.   Right Atrium: Right atrial size was normal in size.   Pericardium: There is no evidence of pericardial effusion.   Mitral Valve: The mitral valve is normal in structure. No evidence of  mitral valve regurgitation. No evidence of mitral valve stenosis.   Tricuspid Valve: The tricuspid valve is grossly normal. Tricuspid valve  regurgitation is not demonstrated.   Aortic Valve: The aortic valve is normal in structure. Aortic valve  regurgitation is not visualized. No aortic stenosis is present.   Pulmonic Valve: The pulmonic valve was normal in structure. Pulmonic valve  regurgitation is not visualized. No evidence of pulmonic stenosis.   Aorta: The aortic root and ascending aorta are structurally normal, with  no evidence of dilitation.   IAS/Shunts: The atrial septum is grossly normal.     LEFT VENTRICLE  PLAX 2D  LVIDd:         4.97 cm  Diastology  LVIDs:         2.08 cm  LV e' lateral:   8.49 cm/s  LV PW:         1.38 cm  LV E/e' lateral: 8.1  LV IVS:        1.32 cm  LV e' medial:    9.03 cm/s  LVOT diam:     2.00 cm  LV E/e' medial:  7.6  LV SV:         63  LV SV Index:   32  LVOT Area:     3.14 cm     RIGHT VENTRICLE  RV Basal diam:  2.57 cm  RV S prime:     8.81 cm/s  TAPSE (M-mode): 1.5 cm    Lexiscan 06/24/2019 Narrative & Impression  Nuclear stress EF: 61%. There were no wall motion abnormalities  There was no ST segment  deviation noted during stress.  Defect 1: There is a small defect of mild severity present in the apical anterior location. Small area of ischemia cannot be excluded.  This is an overall low risk study. There is a small area of possible ischemia in the apical anterior region      Labs/Other Tests and Data Reviewed:    EKG:  No ECG reviewed.  Recent Labs: No results found for requested labs within last 8760 hours.   Recent Lipid Panel Lab Results  Component Value Date/Time   CHOL  10/04/2009 10:13 AM    122        ATP III CLASSIFICATION:  <200     mg/dL   Desirable  [id number]  mg/dL   Borderline High  >=829>=240    mg/dL   High          TRIG 56 10/04/2009 10:13 AM   HDL 47 10/04/2009 10:13 AM   CHOLHDL 2.6 10/04/2009 10:13 AM   LDLCALC  10/04/2009 10:13 AM    64        Total Cholesterol/HDL:CHD Risk Coronary Heart Disease Risk Table                     Men   Women  1/2 Average Risk   3.4   3.3  Average Risk       5.0   4.4  2 X Average Risk   9.6   7.1  3 X Average Risk  23.4   11.0        Use the calculated Patient Ratio above and the CHD Risk Table to determine the patient's CHD Risk.        ATP III CLASSIFICATION (LDL):  <100     mg/dL   Optimal  [id number]  mg/dL   Near or Above                    Optimal  130-159  mg/dL   Borderline  [id number]  mg/dL   High  >696>190     mg/dL   Very High    Wt Readings from Last 3 Encounters:  06/25/19 215 lb (97.5 kg)  06/24/19 219 lb (99.3 kg)  06/05/19 219 lb (99.3 kg)     Objective:    Vital Signs:  BP (!) 151/68   Pulse 97   Ht 5\' 3"  (1.6 m)   Wt 215 lb (97.5 kg)   BMI 38.09 kg/m    VITAL SIGNS:  reviewed GEN:  no acute distress RESPIRATORY:  normal respiratory effort, symmetric expansion CARDIOVASCULAR:  no peripheral edema  ASSESSMENT & PLAN:    Chest pain right jaw and arm pain-no further pain. Under a lot of stress. Myoview low risk and echo normal LV function. Reviewed in detail. No changes. Recommend 150 min  exercise weekly  Essential hypertension-BP up today but patient wants to check it and call us back if staying high. She is getting extra salt. 2 gm sodium diet. She'll keep track of BP and F/u with me in 2 weeks. Can increase lisinopril if needed.  Diabetes mellitus managed by his PCP.  She is on a statin-labs done recently by PCP  Chronic pain syndrome but mostly in her knees and legs  Leg pain and varicose vein she's concerned about-no swelling or erythema. Had to convert visit to telephone b/c of technical problems. To  see PCP.  COVID-19 Education: The signs and symptoms of COVID-19 were discussed with the patient and how to seek care for testing (follow up with PCP or arrange E-visit).  The importance of social distancing was discussed today.  Time:   Today, I have spent 25 minutes with the patient with telehealth technology discussing the above problems.     Medication Adjustments/Labs and Tests Ordered: Current medicines are reviewed at length with the patient today.  Concerns regarding medicines are outlined above.   Tests Ordered: No orders of the defined types were placed in this encounter.   Medication Changes: No orders of the defined types were placed in this encounter.   Follow Up:  Virtual Visit  in 2 week(s) Jacolyn Reedy PA-C  Signed, Jacolyn Reedy, PA-C  06/26/2019 11:03 AM    [facility]

## 2019-06-25 ENCOUNTER — Ambulatory Visit (HOSPITAL_COMMUNITY): Payer: Medicare Other | Attending: Cardiology

## 2019-06-25 DIAGNOSIS — R072 Precordial pain: Secondary | ICD-10-CM | POA: Insufficient documentation

## 2019-06-25 LAB — MYOCARDIAL PERFUSION IMAGING
LV dias vol: 73 mL (ref 46–106)
LV sys vol: 29 mL
Peak HR: 96 {beats}/min
Rest HR: 70 {beats}/min
SDS: 3
SRS: 0
SSS: 3
TID: 1.07

## 2019-06-25 MED ORDER — TECHNETIUM TC 99M TETROFOSMIN IV KIT
32.5000 | PACK | Freq: Once | INTRAVENOUS | Status: AC | PRN
  Administered 2019-06-25: 32.5 via INTRAVENOUS
  Filled 2019-06-25: qty 33

## 2019-06-25 MED ORDER — REGADENOSON 0.4 MG/5ML IV SOLN
0.4000 mg | Freq: Once | INTRAVENOUS | Status: AC
  Administered 2019-06-25: 0.4 mg via INTRAVENOUS

## 2019-06-26 ENCOUNTER — Telehealth (INDEPENDENT_AMBULATORY_CARE_PROVIDER_SITE_OTHER): Payer: Medicare Other | Admitting: Physician Assistant

## 2019-06-26 ENCOUNTER — Encounter: Payer: Self-pay | Admitting: Physician Assistant

## 2019-06-26 VITALS — BP 151/68 | HR 97 | Ht 63.0 in | Wt 215.0 lb

## 2019-06-26 DIAGNOSIS — I10 Essential (primary) hypertension: Secondary | ICD-10-CM

## 2019-06-26 DIAGNOSIS — R079 Chest pain, unspecified: Secondary | ICD-10-CM

## 2019-06-26 DIAGNOSIS — G8929 Other chronic pain: Secondary | ICD-10-CM

## 2019-06-26 DIAGNOSIS — M79606 Pain in leg, unspecified: Secondary | ICD-10-CM

## 2019-06-26 DIAGNOSIS — E119 Type 2 diabetes mellitus without complications: Secondary | ICD-10-CM | POA: Diagnosis not present

## 2019-06-26 NOTE — Patient Instructions (Signed)
Medication Instructions:  Your physician recommends that you continue on your current medications as directed. Please refer to the Current Medication list given to you today.  *If you need a refill on your cardiac medications before your next appointment, please call your pharmacy*   Lab Work: None ordered  If you have labs (blood work) drawn today and your tests are completely normal, you will receive your results only by: Marland Kitchen MyChart Message (if you have MyChart) OR . A paper copy in the mail If you have any lab test that is abnormal or we need to change your treatment, we will call you to review the results.   Testing/Procedures: None ordered   Follow-Up: Follow up with Ermalinda Barrios, PA via MyChart Visit on 07/09/19 at 7:45 AM   Other Instructions 1. Your physician has requested that you regularly monitor and record your blood pressure readings at home. Please use the same machine at the same time of day to check your readings and record them.  2. Your provider recommends that you maintain 150 minutes per week of moderate aerobic activity.  3. Follow up with your PCP for your leg pain  Two Gram Sodium Diet 2000 mg  What is Sodium? Sodium is a mineral found naturally in many foods. The most significant source of sodium in the diet is table salt, which is about 40% sodium.  Processed, convenience, and preserved foods also contain a large amount of sodium.  The body needs only 500 mg of sodium daily to function,  A normal diet provides more than enough sodium even if you do not use salt.  Why Limit Sodium? A build up of sodium in the body can cause thirst, increased blood pressure, shortness of breath, and water retention.  Decreasing sodium in the diet can reduce edema and risk of heart attack or stroke associated with high blood pressure.  Keep in mind that there are many other factors involved in these health problems.  Heredity, obesity, lack of exercise, cigarette smoking, stress  and what you eat all play a role.  General Guidelines:  Do not add salt at the table or in cooking.  One teaspoon of salt contains over 2 grams of sodium.  Read food labels  Avoid processed and convenience foods  Ask your dietitian before eating any foods not dicussed in the menu planning guidelines  Consult your physician if you wish to use a salt substitute or a sodium containing medication such as antacids.  Limit milk and milk products to 16 oz (2 cups) per day.  Shopping Hints:  READ LABELS!! "Dietetic" does not necessarily mean low sodium.  Salt and other sodium ingredients are often added to foods during processing.   Menu Planning Guidelines Food Group Choose More Often Avoid  Beverages (see also the milk group All fruit juices, low-sodium, salt-free vegetables juices, low-sodium carbonated beverages Regular vegetable or tomato juices, commercially softened water used for drinking or cooking  Breads and Cereals Enriched white, wheat, rye and pumpernickel bread, hard rolls and dinner rolls; muffins, cornbread and waffles; most dry cereals, cooked cereal without added salt; unsalted crackers and breadsticks; low sodium or homemade bread crumbs Bread, rolls and crackers with salted tops; quick breads; instant hot cereals; pancakes; commercial bread stuffing; self-rising flower and biscuit mixes; regular bread crumbs or cracker crumbs  Desserts and Sweets Desserts and sweets mad with mild should be within allowance Instant pudding mixes and cake mixes  Fats Butter or margarine; vegetable oils; unsalted salad dressings,  regular salad dressings limited to 1 Tbs; light, sour and heavy cream Regular salad dressings containing bacon fat, bacon bits, and salt pork; snack dips made with instant soup mixes or processed cheese; salted nuts  Fruits Most fresh, frozen and canned fruits Fruits processed with salt or sodium-containing ingredient (some dried fruits are processed with sodium sulfites         Vegetables Fresh, frozen vegetables and low- sodium canned vegetables Regular canned vegetables, sauerkraut, pickled vegetables, and others prepared in brine; frozen vegetables in sauces; vegetables seasoned with ham, bacon or salt pork  Condiments, Sauces, Miscellaneous  Salt substitute with physician's approval; pepper, herbs, spices; vinegar, lemon or lime juice; hot pepper sauce; garlic powder, onion powder, low sodium soy sauce (1 Tbs.); low sodium condiments (ketchup, chili sauce, mustard) in limited amounts (1 tsp.) fresh ground horseradish; unsalted tortilla chips, pretzels, potato chips, popcorn, salsa (1/4 cup) Any seasoning made with salt including garlic salt, celery salt, onion salt, and seasoned salt; sea salt, rock salt, kosher salt; meat tenderizers; monosodium glutamate; mustard, regular soy sauce, barbecue, sauce, chili sauce, teriyaki sauce, steak sauce, Worcestershire sauce, and most flavored vinegars; canned gravy and mixes; regular condiments; salted snack foods, olives, picles, relish, horseradish sauce, catsup   Food preparation: Try these seasonings Meats:    Pork Sage, onion Serve with applesauce  Chicken Poultry seasoning, thyme, parsley Serve with cranberry sauce  Lamb Curry powder, rosemary, garlic, thyme Serve with mint sauce or jelly  Veal Marjoram, basil Serve with current jelly, cranberry sauce  Beef Pepper, bay leaf Serve with dry mustard, unsalted chive butter  Fish [place] leaf, dill Serve with unsalted lemon butter, unsalted parsley butter  Vegetables:    Asparagus Lemon juice   Broccoli Lemon juice   Carrots Mustard dressing parsley, mint, nutmeg, glazed with unsalted butter and sugar   Green beans Marjoram, lemon juice, nutmeg,dill seed   Tomatoes Basil, marjoram, onion   Spice /blend for [employer]" 4 tsp ground thyme 1 tsp ground sage 3 tsp ground rosemary 4 tsp ground marjoram   Test your knowledge 1. A product that says "Salt Free" may  still contain sodium. True or False 2. Garlic Powder and Hot Pepper Sauce an be used as alternative seasonings.True or False 3. Processed foods have more sodium than fresh foods.  True or False 4. Canned Vegetables have less sodium than froze True or False  WAYS TO DECREASE YOUR SODIUM INTAKE 1. Avoid the use of added salt in cooking and at the table.  Table salt (and other prepared seasonings which contain salt) is probably one of the greatest sources of sodium in the diet.  Unsalted foods can gain flavor from the sweet, sour, and butter taste sensations of herbs and spices.  Instead of using salt for seasoning, try the following seasonings with the foods listed.  Remember: how you use them to enhance natural food flavors is limited only by your creativity... Allspice-Meat, fish, eggs, fruit, peas, red and yellow vegetables Almond Extract-Fruit baked goods Anise Seed-Sweet breads, fruit, carrots, beets, cottage cheese, cookies (tastes like licorice) Basil-Meat, fish, eggs, vegetables, rice, vegetables salads, soups, sauces Bay Leaf-Meat, fish, stews, poultry Burnet-Salad, vegetables (cucumber-like flavor) Caraway Seed-Bread, cookies, cottage cheese, meat, vegetables, cheese, rice Cardamon-Baked goods, fruit, soups Celery Powder or seed-Salads, salad dressings, sauces, meatloaf, soup, bread.Do not use  celery salt Chervil-Meats, salads, fish, eggs, vegetables, cottage cheese (parsley-like flavor) Chili Power-Meatloaf, chicken cheese, corn, eggplant, egg dishes Chives-Salads cottage cheese, egg dishes, soups, vegetables, sauces Cilantro-Salsa,  casseroles Cinnamon-Baked goods, fruit, pork, lamb, chicken, carrots Cloves-Fruit, baked goods, fish, pot roast, green beans, beets, carrots Coriander-Pastry, cookies, meat, salads, cheese (lemon-orange flavor) Cumin-Meatloaf, fish,cheese, eggs, cabbage,fruit pie (caraway flavor) [employer], fruit, eggs, fish, poultry, cottage cheese,  vegetables Dill Seed-Meat, cottage cheese, poultry, vegetables, fish, salads, bread Fennel Seed-Bread, cookies, apples, pork, eggs, fish, beets, cabbage, cheese, Licorice-like flavor Garlic-(buds or powder) Salads, meat, poultry, fish, bread, butter, vegetables, potatoes.Do not  use garlic salt Ginger-Fruit, vegetables, baked goods, meat, fish, poultry Horseradish Root-Meet, vegetables, butter Lemon Juice or Extract-Vegetables, fruit, tea, baked goods, fish salads Mace-Baked goods fruit, vegetables, fish, poultry (taste like nutmeg) Maple Extract-Syrups Marjoram-Meat, chicken, fish, vegetables, breads, green salads (taste like Sage) Mint-Tea, lamb, sherbet, vegetables, desserts, carrots, cabbage Mustard, Dry or Seed-Cheese, eggs, meats, vegetables, poultry Nutmeg-Baked goods, fruit, chicken, eggs, vegetables, desserts Onion Powder-Meat, fish, poultry, vegetables, cheese, eggs, bread, rice salads (Do not use   Onion salt) Orange Extract-Desserts, baked goods Oregano-Pasta, eggs, cheese, onions, pork, lamb, fish, chicken, vegetables, green salads Paprika-Meat, fish, poultry, eggs, cheese, vegetables Parsley Flakes-Butter, vegetables, meat fish, poultry, eggs, bread, salads (certain forms may   Contain sodium Pepper-Meat fish, poultry, vegetables, eggs Peppermint Extract-Desserts, baked goods Poppy Seed-Eggs, bread, cheese, fruit dressings, baked goods, noodles, vegetables, cottage  [employer], poultry, meat, fish, cauliflower, turnips,eggs bread Saffron-Rice, bread, veal, chicken, fish, eggs Sage-Meat, fish, poultry, onions, eggplant, tomateos, pork, stews Savory-Eggs, salads, poultry, meat, rice, vegetables, soups, pork Tarragon-Meat, poultry, fish, eggs, butter, vegetables (licorice-like flavor)  Thyme-Meat, poultry, fish, eggs, vegetables, (clover-like flavor), sauces, soups Tumeric-Salads, butter, eggs, fish, rice, vegetables (saffron-like  flavor) Vanilla Extract-Baked goods, candy Vinegar-Salads, vegetables, meat marinades Walnut Extract-baked goods, candy  2. Choose your Foods Wisely   The following is a list of foods to avoid which are high in sodium:  Meats-Avoid all smoked, canned, salt cured, dried and kosher meat and fish as well as Anchovies   [employer] meats:Bologna, Liverwurst, Pastrami Canned meat or fish  Marinated herring Caviar    Pepperoni Corned Beef   Pizza Dried chipped beef  Salami Frozen breaded fish or meat Salt pork Frankfurters or hot dogs  Sardines Gefilte fish   Sausage Ham (boiled ham, Proscuitto Smoked butt    spiced ham)   Spam      TV Dinners Vegetables Canned vegetables (Regular) Relish Canned mushrooms  Sauerkraut Olives    Tomato juice Pickles  Bakery and Dessert Products Canned puddings  Cream pies Cheesecake   Decorated cakes Cookies  Beverages/Juices Tomato juice, regular  Gatorade   V-8 vegetable juice, regular  Breads and Cereals Biscuit mixes   Salted potato chips, corn chips, pretzels Bread stuffing mixes  Salted crackers and rolls Pancake and waffle mixes Self-rising flour  Seasonings Accent    Meat sauces Barbecue sauce  Meat tenderizer Catsup    Monosodium glutamate (MSG) Celery salt   Onion salt Chili sauce   Prepared mustard Garlic salt   Salt, seasoned salt, sea salt Gravy mixes   Soy sauce Horseradish   Steak sauce Ketchup   Tartar sauce Lite salt    Teriyaki sauce Marinade mixes   Worcestershire sauce  Others Baking powder   Cocoa and cocoa mixes Baking soda   Commercial casserole mixes Candy-caramels, chocolate  Dehydrated soups    Bars, fudge,nougats  Instant rice and pasta mixes Canned broth or soup  Maraschino cherries Cheese, aged and processed cheese and cheese spreads  Learning Assessment Quiz  Indicated T (for True)  or F (for False) for each of the following statements:  1. _____ Fresh fruits and vegetables and  unprocessed grains are generally low in sodium 2. _____ Water may contain a considerable amount of sodium, depending on the source 3. _____ You can always tell if a food is high in sodium by tasting it 4. _____ Certain laxatives my be high in sodium and should be avoided unless prescribed   by a physician or pharmacist 5. _____ Salt substitutes may be used freely by anyone on a sodium restricted diet 6. _____ Sodium is present in table salt, food additives and as a natural component of   most foods 7. _____ Table salt is approximately 90% sodium 8. _____ Limiting sodium intake may help prevent excess fluid accumulation in the body 9. _____ On a sodium-restricted diet, seasonings such as bouillon soy sauce, and    cooking wine should be used in place of table salt 10. _____ On an ingredient list, a product which lists monosodium glutamate as the first   ingredient is an appropriate food to include on a low sodium diet  Circle the best answer(s) to the following statements (Hint: there may be more than one correct answer)  11. On a low-sodium diet, some acceptable snack items are:    A. Olives  F. Bean dip   K. Grapefruit juice    B. Salted Pretzels G. Commercial Popcorn   L. Canned peaches    C. Carrot Sticks  H. Bouillon   M. Unsalted nuts   D. [place] fries  I. Peanut butter crackers N. Salami   E. Sweet pickles J. Tomato Juice   O. Pizza  12.  Seasonings that may be used freely on a reduced - sodium diet include   A. Lemon wedges F.Monosodium glutamate K. Celery seed    B.Soysauce   G. Pepper   L. Mustard powder   C. Sea salt  H. Cooking wine  M. Onion flakes   D. Vinegar  E. Prepared horseradish N. Salsa   E. Sage   J. Worcestershire sauce  O. Chutney

## 2019-07-01 NOTE — Progress Notes (Signed)
Virtual Visit via Video Note   This visit type was conducted due to national recommendations for restrictions regarding the COVID-19 Pandemic (e.g. social distancing) in an effort to limit this patient's exposure and mitigate transmission in our community.  Due to her co-morbid illnesses, this patient is at least at moderate risk for complications without adequate follow up.  This format is felt to be most appropriate for this patient at this time.  All issues noted in this document were discussed and addressed.  A limited physical exam was performed with this format.  Please refer to the patient's chart for her consent to telehealth for [facility].   The patient was identified using 2 identifiers.  Date:  07/09/2019   ID:  Golden Hurter, DOB Nov 06, 1945, MRN [id number]  Patient Location: Home Provider Location: Home  PCP:  [name], PA-C  Cardiologist:  Verne Carrow, MD   Electrophysiologist:  None   Evaluation Performed:  Follow-Up Visit  Chief Complaint:  Follow up  History of Present Illness:    NOHEA KRAS is a 74 y.o. female with  with  history of hypertension, DM, chronic pain syndrome, hypothyroidism who saw Dr. Clifton James 04/09/2018 for evaluation of chest pain night.  Echocardiogram and nuclear stress test were ordered but never done.   I had a video visit with the patient 06/05/2019 complaining of right jaw pain down her right arm and into her chest easing with baby aspirin and rest.  I had her come to the office for an EKG which was unchanged so we will schedule a Lexiscan and echo 06/24/2019.  Lexiscan was overall low risk but could not rule out a small area of anterior apical ischemia. Echo normal LVEF 60-65% grade 1 DD.  I had a video visit with the patient 06/26/2019 which time she was under a lot of stress with her son and his family moving in. BP was running high that visit-no changes made as patient wanted to what her salt and monitor BP.  Patient has  kept track of her BP's and they have been 128-130/80's. Pulse running in 80-90's. She thinks it's do to stress.  Watching her salt closer. Unable to exercise much because of hip pain. Having ultrasound on a vein in the back of her leg.   The patient does not have symptoms concerning for COVID-19 infection (fever, chills, cough, or new shortness of breath).    Past Medical History:  Diagnosis Date  . Arthritis    degenerative lumbar spine, back injection with Dr. Sherene Sires office    . Chronic back pain    stenosis  . Closed displaced fracture of base of neck of left femur (HCC)   . Closed left subtrochanteric femur fracture (HCC) 05/20/2017  . Complication of anesthesia    woke up very slowly, states she was told that " we had a time waking me you up"  . Constipation    takes Stool Softener daily  . Diabetes mellitus without complication (HCC)    takes Amaryl and Metformin daily  . Diverticulitis   . DJD (degenerative joint disease) of knee 05/19/2014  . Fall   . Gout    takes Allopurinol daily  . History of blood transfusion    no abnormal reaction noted  . Hypertension    takes Lisinopril-HCTZ daily  . Joint pain   . Lumbar pain with radiation down right leg   . Nocturia   . Osteoporosis 05/23/2017  . Precordial chest pain 05/09/2014  .  Primary localized osteoarthritis of left knee   . Primary localized osteoarthritis of right knee   . Restless leg syndrome    takes Lyrica daily  . S/P lumbar laminectomy 03/12/2015  . Urinary frequency   . Weakness    numbness and tingling in both legs  . Wound dehiscence    Past Surgical History:  Procedure Laterality Date  . Bladder Tacking  1981  . [place].   . ESOPHAGOGASTRODUODENOSCOPY    . EYE SURGERY     cataracts removed bilateral /w IOL  . HIP ARTHROPLASTY Left 05/20/2017   Procedure: ARTHROPLASTY BIPOLAR HIP (HEMIARTHROPLASTY);  Surgeon: Hiram Gash, MD;  Location: [place];  Service: Orthopedics;   Laterality: Left;  . LUMBAR LAMINECTOMY  04/21/2015  . LUMBAR LAMINECTOMY/DECOMPRESSION MICRODISCECTOMY Bilateral 03/12/2015   Procedure: Bilateral Lumbar Three-Four, Lumbar Four-Five Laminectomy and Foraminotomy ;  Surgeon: Eustace Moore, MD;  Location: [place] [facility];  Service: Neurosurgery;  Laterality: Bilateral;  . LUMBAR WOUND DEBRIDEMENT N/A 04/21/2015   Procedure: Lumbar wound revision;  Surgeon: Eustace Moore, MD;  Location: [place] [facility];  Service: Neurosurgery;  Laterality: N/A;  . PARTIAL HYSTERECTOMY  1998   Partial  . TOTAL KNEE ARTHROPLASTY Left 05/19/2014   dr Noemi Chapel  . TOTAL KNEE ARTHROPLASTY Left 05/19/2014   Procedure: LEFT TOTAL KNEE ARTHROPLASTY;  Surgeon: Lorn Junes, MD;  Location: [place];  Service: Orthopedics;  Laterality: Left;  . TUBAL LIGATION Bilateral 1980  . VARICOSE VEIN SURGERY       Current Meds  Medication Sig  . allopurinol (ZYLOPRIM) 300 MG tablet Take 1 tablet by mouth daily.  Marland Kitchen atorvastatin (LIPITOR) 10 MG tablet Take 1 tablet by mouth daily.  . Cholecalciferol (VITAMIN D) 2000 UNITS CAPS Take 5,000 Units by mouth daily.   . cyclobenzaprine (FLEXERIL) 10 MG tablet Take 1 tablet by mouth in the morning and at bedtime.  . diclofenac sodium (VOLTAREN) 1 % GEL Apply 2 g topically 4 (four) times daily as needed (knee pain).  Marland Kitchen glimepiride (AMARYL) 1 MG tablet Take 1 mg by mouth daily with breakfast.  . levothyroxine (SYNTHROID, LEVOTHROID) 25 MCG tablet Take 25 mcg by mouth daily before breakfast.  . lisinopril (PRINIVIL,ZESTRIL) 5 MG tablet Take 5 mg by mouth daily.  . meloxicam (MOBIC) 15 MG tablet Take 1 tablet by mouth daily.  . metFORMIN (GLUCOPHAGE) 500 MG tablet Take 500 mg by mouth 2 (two) times daily with a meal.  . mirabegron ER (MYRBETRIQ) 25 MG TB24 tablet Take 25 mg by mouth daily.  Marland Kitchen oxyCODONE (ROXICODONE) 15 MG immediate release tablet Take 15 mg by mouth every 6 (six) hours as needed (pain).   . pregabalin (LYRICA) 200 MG capsule Take 200 mg  by mouth 3 (three) times daily.   Marland Kitchen rOPINIRole (REQUIP) 3 MG tablet Take 3 mg by mouth at bedtime.   . vitamin B-12 (CYANOCOBALAMIN) 1000 MCG tablet Take 1,000 mcg by mouth daily.     Allergies:   Penicillins   Social History   Tobacco Use  . Smoking status: Never Smoker  . Smokeless tobacco: Never Used  Substance Use Topics  . Alcohol use: No  . Drug use: No     Family Hx: The patient's family history includes Anxiety disorder in her father; CAD in her father and paternal grandfather; Congenital heart disease in her paternal grandfather; Congestive Heart Failure in her father; Depression in her father; Diabetes in her brother; Diabetes type II  in her paternal grandfather; Heart attack in her mother; Heart disease in her father; Hypertension in her brother and paternal grandfather; Osteoporosis in her father and paternal grandfather; Stroke in her mother.  ROS:   Please see the history of present illness.      All other systems reviewed and are negative.   Prior CV studies:   The following studies were reviewed today:  2D echo 06/24/2019 IMPRESSIONS     1. Left ventricular ejection fraction, by estimation, is 60 to 65%. The  left ventricle has normal function. The left ventricle has no regional  wall motion abnormalities. There is mild concentric left ventricular  hypertrophy. Left ventricular diastolic  parameters are consistent with Grade I diastolic dysfunction (impaired  relaxation).   2. Right ventricular systolic function is normal. The right ventricular  size is normal.   3. The mitral valve is normal in structure. No evidence of mitral valve  regurgitation. No evidence of mitral stenosis.   4. The aortic valve is normal in structure. Aortic valve regurgitation is  not visualized. No aortic stenosis is present.   FINDINGS   Left Ventricle: Left ventricular ejection fraction, by estimation, is 60  to 65%. The left ventricle has normal function. The left ventricle  has no  regional wall motion abnormalities. The left ventricular internal cavity  size was normal in size. There is   mild concentric left ventricular hypertrophy. Left ventricular diastolic  parameters are consistent with Grade I diastolic dysfunction (impaired  relaxation).   Right Ventricle: The right ventricular size is normal. No increase in  right ventricular wall thickness. Right ventricular systolic function is  normal.   Left Atrium: Left atrial size was normal in size.   Right Atrium: Right atrial size was normal in size.   Pericardium: There is no evidence of pericardial effusion.   Mitral Valve: The mitral valve is normal in structure. No evidence of  mitral valve regurgitation. No evidence of mitral valve stenosis.   Tricuspid Valve: The tricuspid valve is grossly normal. Tricuspid valve  regurgitation is not demonstrated.   Aortic Valve: The aortic valve is normal in structure. Aortic valve  regurgitation is not visualized. No aortic stenosis is present.   Pulmonic Valve: The pulmonic valve was normal in structure. Pulmonic valve  regurgitation is not visualized. No evidence of pulmonic stenosis.   Aorta: The aortic root and ascending aorta are structurally normal, with  no evidence of dilitation.   IAS/Shunts: The atrial septum is grossly normal.     LEFT VENTRICLE  PLAX 2D  LVIDd:         4.97 cm  Diastology  LVIDs:         2.08 cm  LV e' lateral:   8.49 cm/s  LV PW:         1.38 cm  LV E/e' lateral: 8.1  LV IVS:        1.32 cm  LV e' medial:    9.03 cm/s  LVOT diam:     2.00 cm  LV E/e' medial:  7.6  LV SV:         63  LV SV Index:   32  LVOT Area:     3.14 cm     RIGHT VENTRICLE  RV Basal diam:  2.57 cm  RV S prime:     8.81 cm/s  TAPSE (M-mode): 1.5 cm      Lexiscan 06/24/2019 Narrative & Impression  Nuclear stress EF: 61%. There were no wall motion abnormalities  There was no ST segment deviation noted during stress.  Defect 1:  There is a small defect of mild severity present in the apical anterior location. Small area of ischemia cannot be excluded.  This is an overall low risk study. There is a small area of possible ischemia in the apical anterior region      Labs/Other Tests and Data Reviewed:    EKG:  No ECG reviewed.  Recent Labs: No results found for requested labs within last 8760 hours.   Recent Lipid Panel Lab Results  Component Value Date/Time   CHOL  10/04/2009 10:13 AM    122        ATP III CLASSIFICATION:  <200     mg/dL   Desirable  408-144  mg/dL   Borderline High  >=818    mg/dL   High          TRIG 56 10/04/2009 10:13 AM   HDL 47 10/04/2009 10:13 AM   CHOLHDL 2.6 10/04/2009 10:13 AM   LDLCALC  10/04/2009 10:13 AM    64        Total Cholesterol/HDL:CHD Risk Coronary Heart Disease Risk Table                     Men   Women  1/2 Average Risk   3.4   3.3  Average Risk       5.0   4.4  2 X Average Risk   9.6   7.1  3 X Average Risk  23.4   11.0        Use the calculated Patient Ratio above and the CHD Risk Table to determine the patient's CHD Risk.        ATP III CLASSIFICATION (LDL):  <100     mg/dL   Optimal  563-149  mg/dL   Near or Above                    Optimal  130-159  mg/dL   Borderline  702-637  mg/dL   High  >858     mg/dL   Very High    Wt Readings from Last 3 Encounters:  07/09/19 218 lb (98.9 kg)  06/25/19 215 lb (97.5 kg)  06/24/19 219 lb (99.3 kg)     Objective:    Vital Signs:  BP (!) 148/75   Pulse 79   Ht 5\' 3"  (1.6 m)   Wt 218 lb (98.9 kg)   BMI 38.62 kg/m    VITAL SIGNS:  reviewed GEN:  no acute distress RESPIRATORY:  normal respiratory effort, symmetric expansion CARDIOVASCULAR:  no peripheral edema  ASSESSMENT & PLAN:    Essential hypertension patient's blood pressure was elevated last office visit but she was getting extra salt and wanted to hold off on increasing medicines at that time. BP up this am but hasn't taken her meds yet.  Overall well controlled at home. She will call if it starts to run high.  History of chest pain under a lot of stress.  Myoview was low risk and echo with normal LV function  Diabetes mellitus managed by PCP   chronic pain syndrome managed by PCP  Leg pain/varicosities -having ultrasound today  COVID-19 Education: The signs and symptoms of COVID-19 were discussed with the patient and how to seek care for testing (follow up with PCP or arrange E-visit).   The importance of social distancing  was discussed today.  Time:   Today, I have spent 8:08 minutes with the patient with telehealth technology discussing the above problems.     Medication Adjustments/Labs and Tests Ordered: Current medicines are reviewed at length with the patient today.  Concerns regarding medicines are outlined above.   Tests Ordered: No orders of the defined types were placed in this encounter.   Medication Changes: No orders of the defined types were placed in this encounter.   Follow Up:  Either In Person or Virtual in 1 year(s) Dr. Clifton James  Signed, Jacolyn Reedy, PA-C  07/09/2019 8:00 AM    [facility]

## 2019-07-09 ENCOUNTER — Encounter: Payer: Self-pay | Admitting: Physician Assistant

## 2019-07-09 ENCOUNTER — Telehealth (INDEPENDENT_AMBULATORY_CARE_PROVIDER_SITE_OTHER): Payer: Medicare Other | Admitting: Physician Assistant

## 2019-07-09 VITALS — BP 148/75 | HR 79 | Ht 63.0 in | Wt 218.0 lb

## 2019-07-09 DIAGNOSIS — I8390 Asymptomatic varicose veins of unspecified lower extremity: Secondary | ICD-10-CM

## 2019-07-09 DIAGNOSIS — G8929 Other chronic pain: Secondary | ICD-10-CM

## 2019-07-09 DIAGNOSIS — I10 Essential (primary) hypertension: Secondary | ICD-10-CM

## 2019-07-09 DIAGNOSIS — E119 Type 2 diabetes mellitus without complications: Secondary | ICD-10-CM

## 2019-07-09 DIAGNOSIS — R079 Chest pain, unspecified: Secondary | ICD-10-CM

## 2019-07-09 NOTE — Patient Instructions (Signed)
Medication Instructions:  Your physician recommends that you continue on your current medications as directed. Please refer to the Current Medication list given to you today.  *If you need a refill on your cardiac medications before your next appointment, please call your pharmacy*   Lab Work: None  If you have labs (blood work) drawn today and your tests are completely normal, you will receive your results only by: Marland Kitchen MyChart Message (if you have MyChart) OR . A paper copy in the mail If you have any lab test that is abnormal or we need to change your treatment, we will call you to review the results.   Testing/Procedures: None   Follow-Up: At [facility], you and your health needs are our priority.  As part of our continuing mission to provide you with exceptional heart care, we have created designated Provider Care Teams.  These Care Teams include your primary Cardiologist (physician) and Advanced Practice Providers (APPs -  Physician Assistants and Nurse Practitioners) who all work together to provide you with the care you need, when you need it.  We recommend signing up for the patient portal called "MyChart".  Sign up information is provided on this After Visit Summary.  MyChart is used to connect with patients for Virtual Visits (Telemedicine).  Patients are able to view lab/test results, encounter notes, upcoming appointments, etc.  Non-urgent messages can be sent to your provider as well.   To learn more about what you can do with MyChart, go to [email or web address].    Your next appointment:   12 month(s)  The format for your next appointment:   In Person  Provider:   You may see Verne Carrow, MD or one of the following Advanced Practice Providers on your designated Care Team:    Ronie Spies, PA-C  Jacolyn Reedy, PA-C    Other Instructions  Monitor your Blood Pressure and call our office if it stays above 135/85Two Gram Sodium Diet 2000 mg  2 Gram  Sodium Diet  What is Sodium? Sodium is a mineral found naturally in many foods. The most significant source of sodium in the diet is table salt, which is about 40% sodium.  Processed, convenience, and preserved foods also contain a large amount of sodium.  The body needs only 500 mg of sodium daily to function,  A normal diet provides more than enough sodium even if you do not use salt.  Why Limit Sodium? A build up of sodium in the body can cause thirst, increased blood pressure, shortness of breath, and water retention.  Decreasing sodium in the diet can reduce edema and risk of heart attack or stroke associated with high blood pressure.  Keep in mind that there are many other factors involved in these health problems.  Heredity, obesity, lack of exercise, cigarette smoking, stress and what you eat all play a role.  General Guidelines:  Do not add salt at the table or in cooking.  One teaspoon of salt contains over 2 grams of sodium.  Read food labels  Avoid processed and convenience foods  Ask your dietitian before eating any foods not dicussed in the menu planning guidelines  Consult your physician if you wish to use a salt substitute or a sodium containing medication such as antacids.  Limit milk and milk products to 16 oz (2 cups) per day.  Shopping Hints:  READ LABELS!! "Dietetic" does not necessarily mean low sodium.  Salt and other sodium ingredients are often added to foods during processing.  Menu Planning Guidelines Food Group Choose More Often Avoid  Beverages (see also the milk group All fruit juices, low-sodium, salt-free vegetables juices, low-sodium carbonated beverages Regular vegetable or tomato juices, commercially softened water used for drinking or cooking  Breads and Cereals Enriched white, wheat, rye and pumpernickel bread, hard rolls and dinner rolls; muffins, cornbread and waffles; most dry cereals, cooked cereal without added salt; unsalted crackers and  breadsticks; low sodium or homemade bread crumbs Bread, rolls and crackers with salted tops; quick breads; instant hot cereals; pancakes; commercial bread stuffing; self-rising flower and biscuit mixes; regular bread crumbs or cracker crumbs  Desserts and Sweets Desserts and sweets mad with mild should be within allowance Instant pudding mixes and cake mixes  Fats Butter or margarine; vegetable oils; unsalted salad dressings, regular salad dressings limited to 1 Tbs; light, sour and heavy cream Regular salad dressings containing bacon fat, bacon bits, and salt pork; snack dips made with instant soup mixes or processed cheese; salted nuts  Fruits Most fresh, frozen and canned fruits Fruits processed with salt or sodium-containing ingredient (some dried fruits are processed with sodium sulfites        Vegetables Fresh, frozen vegetables and low- sodium canned vegetables Regular canned vegetables, sauerkraut, pickled vegetables, and others prepared in brine; frozen vegetables in sauces; vegetables seasoned with ham, bacon or salt pork  Condiments, Sauces, Miscellaneous  Salt substitute with physician's approval; pepper, herbs, spices; vinegar, lemon or lime juice; hot pepper sauce; garlic powder, onion powder, low sodium soy sauce (1 Tbs.); low sodium condiments (ketchup, chili sauce, mustard) in limited amounts (1 tsp.) fresh ground horseradish; unsalted tortilla chips, pretzels, potato chips, popcorn, salsa (1/4 cup) Any seasoning made with salt including garlic salt, celery salt, onion salt, and seasoned salt; sea salt, rock salt, kosher salt; meat tenderizers; monosodium glutamate; mustard, regular soy sauce, barbecue, sauce, chili sauce, teriyaki sauce, steak sauce, Worcestershire sauce, and most flavored vinegars; canned gravy and mixes; regular condiments; salted snack foods, olives, picles, relish, horseradish sauce, catsup   Food preparation: Try these seasonings Meats:    Pork Sage, onion  Serve with applesauce  Chicken Poultry seasoning, thyme, parsley Serve with cranberry sauce  Lamb Curry powder, rosemary, garlic, thyme Serve with mint sauce or jelly  Veal Marjoram, basil Serve with current jelly, cranberry sauce  Beef Pepper, bay leaf Serve with dry mustard, unsalted chive butter  Fish [place] leaf, dill Serve with unsalted lemon butter, unsalted parsley butter  Vegetables:    Asparagus Lemon juice   Broccoli Lemon juice   Carrots Mustard dressing parsley, mint, nutmeg, glazed with unsalted butter and sugar   Green beans Marjoram, lemon juice, nutmeg,dill seed   Tomatoes Basil, marjoram, onion   Spice /blend for [employer]" 4 tsp ground thyme 1 tsp ground sage 3 tsp ground rosemary 4 tsp ground marjoram   Test your knowledge 1. A product that says "Salt Free" may still contain sodium. True or False 2. Garlic Powder and Hot Pepper Sauce an be used as alternative seasonings.True or False 3. Processed foods have more sodium than fresh foods.  True or False 4. Canned Vegetables have less sodium than froze True or False  WAYS TO DECREASE YOUR SODIUM INTAKE 1. Avoid the use of added salt in cooking and at the table.  Table salt (and other prepared seasonings which contain salt) is probably one of the greatest sources of sodium in the diet.  Unsalted foods can gain flavor from the sweet, sour, and butter taste  sensations of herbs and spices.  Instead of using salt for seasoning, try the following seasonings with the foods listed.  Remember: how you use them to enhance natural food flavors is limited only by your creativity... Allspice-Meat, fish, eggs, fruit, peas, red and yellow vegetables Almond Extract-Fruit baked goods Anise Seed-Sweet breads, fruit, carrots, beets, cottage cheese, cookies (tastes like licorice) Basil-Meat, fish, eggs, vegetables, rice, vegetables salads, soups, sauces Bay Leaf-Meat, fish, stews, poultry Burnet-Salad, vegetables (cucumber-like  flavor) Caraway Seed-Bread, cookies, cottage cheese, meat, vegetables, cheese, rice Cardamon-Baked goods, fruit, soups Celery Powder or seed-Salads, salad dressings, sauces, meatloaf, soup, bread.Do not use  celery salt Chervil-Meats, salads, fish, eggs, vegetables, cottage cheese (parsley-like flavor) Chili Power-Meatloaf, chicken cheese, corn, eggplant, egg dishes Chives-Salads cottage cheese, egg dishes, soups, vegetables, sauces Cilantro-Salsa, casseroles Cinnamon-Baked goods, fruit, pork, lamb, chicken, carrots Cloves-Fruit, baked goods, fish, pot roast, green beans, beets, carrots Coriander-Pastry, cookies, meat, salads, cheese (lemon-orange flavor) Cumin-Meatloaf, fish,cheese, eggs, cabbage,fruit pie (caraway flavor) [employer], fruit, eggs, fish, poultry, cottage cheese, vegetables Dill Seed-Meat, cottage cheese, poultry, vegetables, fish, salads, bread Fennel Seed-Bread, cookies, apples, pork, eggs, fish, beets, cabbage, cheese, Licorice-like flavor Garlic-(buds or powder) Salads, meat, poultry, fish, bread, butter, vegetables, potatoes.Do not  use garlic salt Ginger-Fruit, vegetables, baked goods, meat, fish, poultry Horseradish Root-Meet, vegetables, butter Lemon Juice or Extract-Vegetables, fruit, tea, baked goods, fish salads Mace-Baked goods fruit, vegetables, fish, poultry (taste like nutmeg) Maple Extract-Syrups Marjoram-Meat, chicken, fish, vegetables, breads, green salads (taste like Sage) Mint-Tea, lamb, sherbet, vegetables, desserts, carrots, cabbage Mustard, Dry or Seed-Cheese, eggs, meats, vegetables, poultry Nutmeg-Baked goods, fruit, chicken, eggs, vegetables, desserts Onion Powder-Meat, fish, poultry, vegetables, cheese, eggs, bread, rice salads (Do not use   Onion salt) Orange Extract-Desserts, baked goods Oregano-Pasta, eggs, cheese, onions, pork, lamb, fish, chicken, vegetables, green salads Paprika-Meat, fish, poultry, eggs, cheese, vegetables Parsley  Flakes-Butter, vegetables, meat fish, poultry, eggs, bread, salads (certain forms may   Contain sodium Pepper-Meat fish, poultry, vegetables, eggs Peppermint Extract-Desserts, baked goods Poppy Seed-Eggs, bread, cheese, fruit dressings, baked goods, noodles, vegetables, cottage  [employer], poultry, meat, fish, cauliflower, turnips,eggs bread Saffron-Rice, bread, veal, chicken, fish, eggs Sage-Meat, fish, poultry, onions, eggplant, tomateos, pork, stews Savory-Eggs, salads, poultry, meat, rice, vegetables, soups, pork Tarragon-Meat, poultry, fish, eggs, butter, vegetables (licorice-like flavor)  Thyme-Meat, poultry, fish, eggs, vegetables, (clover-like flavor), sauces, soups Tumeric-Salads, butter, eggs, fish, rice, vegetables (saffron-like flavor) Vanilla Extract-Baked goods, candy Vinegar-Salads, vegetables, meat marinades Walnut Extract-baked goods, candy  2. Choose your Foods Wisely   The following is a list of foods to avoid which are high in sodium:  Meats-Avoid all smoked, canned, salt cured, dried and kosher meat and fish as well as Anchovies   [employer] meats:Bologna, Liverwurst, Pastrami Canned meat or fish  Marinated herring Caviar    Pepperoni Corned Beef   Pizza Dried chipped beef  Salami Frozen breaded fish or meat Salt pork Frankfurters or hot dogs  Sardines Gefilte fish   Sausage Ham (boiled ham, Proscuitto Smoked butt    spiced ham)   Spam      TV Dinners Vegetables Canned vegetables (Regular) Relish Canned mushrooms  Sauerkraut Olives    Tomato juice Pickles  Bakery and Dessert Products Canned puddings  Cream pies Cheesecake   Decorated cakes Cookies  Beverages/Juices Tomato juice, regular  Gatorade   V-8 vegetable juice, regular  Breads and Cereals Biscuit mixes   Salted potato chips, corn chips, pretzels Bread stuffing mixes  Salted crackers and  rolls Pancake and waffle mixes Self-rising  flour  Seasonings Accent    Meat sauces Barbecue sauce  Meat tenderizer Catsup    Monosodium glutamate (MSG) Celery salt   Onion salt Chili sauce   Prepared mustard Garlic salt   Salt, seasoned salt, sea salt Gravy mixes   Soy sauce Horseradish   Steak sauce Ketchup   Tartar sauce Lite salt    Teriyaki sauce Marinade mixes   Worcestershire sauce  Others Baking powder   Cocoa and cocoa mixes Baking soda   Commercial casserole mixes Candy-caramels, chocolate  Dehydrated soups    Bars, fudge,nougats  Instant rice and pasta mixes Canned broth or soup  Maraschino cherries Cheese, aged and processed cheese and cheese spreads  Learning Assessment Quiz  Indicated T (for True) or F (for False) for each of the following statements:  1. _____ Fresh fruits and vegetables and unprocessed grains are generally low in sodium 2. _____ Water may contain a considerable amount of sodium, depending on the source 3. _____ You can always tell if a food is high in sodium by tasting it 4. _____ Certain laxatives my be high in sodium and should be avoided unless prescribed   by a physician or pharmacist 5. _____ Salt substitutes may be used freely by anyone on a sodium restricted diet 6. _____ Sodium is present in table salt, food additives and as a natural component of   most foods 7. _____ Table salt is approximately 90% sodium 8. _____ Limiting sodium intake may help prevent excess fluid accumulation in the body 9. _____ On a sodium-restricted diet, seasonings such as bouillon soy sauce, and    cooking wine should be used in place of table salt 10. _____ On an ingredient list, a product which lists monosodium glutamate as the first   ingredient is an appropriate food to include on a low sodium diet  Circle the best answer(s) to the following statements (Hint: there may be more than one correct answer)  11. On a low-sodium diet, some acceptable snack items are:    A. Olives  F. Bean dip   K.  Grapefruit juice    B. Salted Pretzels G. Commercial Popcorn   L. Canned peaches    C. Carrot Sticks  H. Bouillon   M. Unsalted nuts   D. [place] fries  I. Peanut butter crackers N. Salami   E. Sweet pickles J. Tomato Juice   O. Pizza  12.  Seasonings that may be used freely on a reduced - sodium diet include   A. Lemon wedges F.Monosodium glutamate K. Celery seed    B.Soysauce   G. Pepper   L. Mustard powder   C. Sea salt  H. Cooking wine  M. Onion flakes   D. Vinegar  E. Prepared horseradish N. Salsa   E. Sage   J. Worcestershire sauce  O. Chutney

## 2020-05-18 ENCOUNTER — Telehealth: Payer: Self-pay | Admitting: *Deleted

## 2020-05-18 NOTE — Telephone Encounter (Signed)
  Patient Consent for Virtual Visit         Amber Ashley has provided verbal consent on 05/18/2020 for a virtual visit (video or telephone).   CONSENT FOR VIRTUAL VISIT FOR:  Amber Ashley  By participating in this virtual visit I agree to the following:  I hereby voluntarily request, consent and authorize [facility] and its employed or contracted physicians, physician assistants, nurse practitioners or other licensed health care professionals (the Practitioner), to provide me with telemedicine health care services (the "Services") as deemed necessary by the treating Practitioner. I acknowledge and consent to receive the Services by the Practitioner via telemedicine. I understand that the telemedicine visit will involve communicating with the Practitioner through live audiovisual communication technology and the disclosure of certain medical information by electronic transmission. I acknowledge that I have been given the opportunity to request an in-person assessment or other available alternative prior to the telemedicine visit and am voluntarily participating in the telemedicine visit.  I understand that I have the right to withhold or withdraw my consent to the use of telemedicine in the course of my care at any time, without affecting my right to future care or treatment, and that the Practitioner or I may terminate the telemedicine visit at any time. I understand that I have the right to inspect all information obtained and/or recorded in the course of the telemedicine visit and may receive copies of available information for a reasonable fee.  I understand that some of the potential risks of receiving the Services via telemedicine include:  Marland Kitchen Delay or interruption in medical evaluation due to technological equipment failure or disruption; . Information transmitted may not be sufficient (e.g. poor resolution of images) to allow for appropriate medical decision making by the  Practitioner; and/or  . In rare instances, security protocols could fail, causing a breach of personal health information.  Furthermore, I acknowledge that it is my responsibility to provide information about my medical history, conditions and care that is complete and accurate to the best of my ability. I acknowledge that Practitioner's advice, recommendations, and/or decision may be based on factors not within their control, such as incomplete or inaccurate data provided by me or distortions of diagnostic images or specimens that may result from electronic transmissions. I understand that the practice of medicine is not an exact science and that Practitioner makes no warranties or guarantees regarding treatment outcomes. I acknowledge that a copy of this consent can be made available to me via my patient portal [facility] MyChart), or I can request a printed copy by calling the office of [facility].    I understand that my insurance will be billed for this visit.   I have read or had this consent read to me. . I understand the contents of this consent, which adequately explains the benefits and risks of the Services being provided via telemedicine.  . I have been provided ample opportunity to ask questions regarding this consent and the Services and have had my questions answered to my satisfaction. . I give my informed consent for the services to be provided through the use of telemedicine in my medical care

## 2020-05-19 ENCOUNTER — Other Ambulatory Visit: Payer: Self-pay

## 2020-05-19 ENCOUNTER — Telehealth (INDEPENDENT_AMBULATORY_CARE_PROVIDER_SITE_OTHER): Payer: Medicare HMO | Admitting: Physician Assistant

## 2020-05-19 DIAGNOSIS — I10 Essential (primary) hypertension: Secondary | ICD-10-CM

## 2020-05-19 NOTE — Progress Notes (Signed)
No show

## 2021-12-28 ENCOUNTER — Ambulatory Visit: Payer: Medicare HMO | Attending: Physician Assistant | Admitting: Physician Assistant

## 2021-12-28 ENCOUNTER — Encounter: Payer: Self-pay | Admitting: Physician Assistant

## 2021-12-28 VITALS — BP 120/84 | HR 74 | Ht 63.0 in | Wt 210.4 lb

## 2021-12-28 DIAGNOSIS — E119 Type 2 diabetes mellitus without complications: Secondary | ICD-10-CM | POA: Diagnosis not present

## 2021-12-28 DIAGNOSIS — Z87898 Personal history of other specified conditions: Secondary | ICD-10-CM | POA: Diagnosis not present

## 2021-12-28 DIAGNOSIS — E782 Mixed hyperlipidemia: Secondary | ICD-10-CM | POA: Diagnosis not present

## 2021-12-28 DIAGNOSIS — I10 Essential (primary) hypertension: Secondary | ICD-10-CM

## 2021-12-28 DIAGNOSIS — G8929 Other chronic pain: Secondary | ICD-10-CM

## 2021-12-28 NOTE — Patient Instructions (Signed)
Medication Instructions:  Your physician recommends that you continue on your current medications as directed. Please refer to the Current Medication list given to you today.  *If you need a refill on your cardiac medications before your next appointment, please call your pharmacy*   Lab Work: None ordered   If you have labs (blood work) drawn today and your tests are completely normal, you will receive your results only by: [place] (if you have MyChart) OR A paper copy in the mail If you have any lab test that is abnormal or we need to change your treatment, we will call you to review the results.   Testing/Procedures: None ordered    Follow-Up: At [facility], you and your health needs are our priority.  As part of our continuing mission to provide you with exceptional heart care, we have created designated Provider Care Teams.  These Care Teams include your primary Cardiologist (physician) and Advanced Practice Providers (APPs -  Physician Assistants and Nurse Practitioners) who all work together to provide you with the care you need, when you need it.  We recommend signing up for the patient portal called "MyChart".  Sign up information is provided on this After Visit Summary.  MyChart is used to connect with patients for Virtual Visits (Telemedicine).  Patients are able to view lab/test results, encounter notes, upcoming appointments, etc.  Non-urgent messages can be sent to your provider as well.   To learn more about what you can do with MyChart, go to [email or web address].    Your next appointment:   12 month(s)  The format for your next appointment:   In Person  Provider:   Lauree Chandler, MD     Other Instructions Your physician recommends that you get 150 minutes of exercise a week   Important Information About Sugar

## 2021-12-28 NOTE — Progress Notes (Signed)
Cardiology Office Note:    Date:  12/28/2021   ID:  Amber Ashley, DOB [birth year], MRN [id number]  PCP:  Chiquita Loth, PA  [facility] Providers Cardiologist:  Verne Carrow, MD     Referring MD: [name], [place]   Chief Complaint:  Follow-up     History of Present Illness:   Amber Ashley is a 76 y.o. female with   history of hypertension, DM, chronic pain syndrome, hypothyroidism who saw Dr. Clifton James 04/09/2018 for evaluation of chest pain night.  Echocardiogram and nuclear stress test were ordered but never done.   I had a video visit with the patient 06/05/2019 complaining of right jaw pain down her right arm and into her chest easing with baby aspirin and rest.  I had her come to the office for an EKG which was unchanged so we will schedule a Lexiscan and echo 06/24/2019.  Lexiscan was overall low risk but could not rule out a small area of anterior apical ischemia. Echo normal LVEF 60-65% grade 1 DD.    Occasionally has pain in right jaw and into her chest. It usually occurs at rest and when she has anxiety and eases with laying down and ASA.  Her son and his 4 children live with them and cause a lot of stress.  Can walk 1/4 mile -no further with hip and knee pain. Denies chest pain.    Past Medical History:  Diagnosis Date   Arthritis    degenerative lumbar spine, back injection with Dr. Sherene Sires office     Chronic back pain    stenosis   Closed displaced fracture of base of neck of left femur (HCC)    Closed left subtrochanteric femur fracture (HCC) 05/20/2017   Complication of anesthesia    woke up very slowly, states she was told that " we had a time waking me you up"   Constipation    takes Stool Softener daily   Diabetes mellitus without complication (HCC)    takes Amaryl and Metformin daily   Diverticulitis    DJD (degenerative joint disease) of knee 05/19/2014   Fall    Gout    takes Allopurinol daily   History of blood transfusion    no  abnormal reaction noted   Hypertension    takes Lisinopril-HCTZ daily   Joint pain    Lumbar pain with radiation down right leg    Nocturia    Osteoporosis 05/23/2017   Precordial chest pain 05/09/2014   Primary localized osteoarthritis of left knee    Primary localized osteoarthritis of right knee    Restless leg syndrome    takes Lyrica daily   S/P lumbar laminectomy 03/12/2015   Urinary frequency    Weakness    numbness and tingling in both legs   Wound dehiscence    Current Medications: Current Meds  Medication Sig   allopurinol (ZYLOPRIM) 300 MG tablet Take 1 tablet by mouth daily.   atorvastatin (LIPITOR) 10 MG tablet Take 1 tablet by mouth daily.   Cholecalciferol (VITAMIN D) 2000 UNITS CAPS Take 5,000 Units by mouth daily.    glimepiride (AMARYL) 1 MG tablet Take 1 mg by mouth daily with breakfast.   levothyroxine (SYNTHROID, LEVOTHROID) 25 MCG tablet Take 25 mcg by mouth daily before breakfast.   lisinopril (PRINIVIL,ZESTRIL) 5 MG tablet Take 5 mg by mouth daily.   meloxicam (MOBIC) 15 MG tablet Take 1 tablet by mouth daily.   metFORMIN (GLUCOPHAGE) 500 MG tablet Take  500 mg by mouth 2 (two) times daily with a meal.   oxyCODONE (ROXICODONE) 15 MG immediate release tablet Take 15 mg by mouth every 6 (six) hours as needed (pain).    pregabalin (LYRICA) 200 MG capsule Take 200 mg by mouth 3 (three) times daily.    rOPINIRole (REQUIP) 3 MG tablet Take 3 mg by mouth at bedtime.    vitamin B-12 (CYANOCOBALAMIN) 1000 MCG tablet Take 1,000 mcg by mouth daily.   [DISCONTINUED] cyclobenzaprine (FLEXERIL) 10 MG tablet Take 1 tablet by mouth in the morning and at bedtime.   [DISCONTINUED] diclofenac sodium (VOLTAREN) 1 % GEL Apply 2 g topically 4 (four) times daily as needed (knee pain).   [DISCONTINUED] mirabegron ER (MYRBETRIQ) 25 MG TB24 tablet Take 25 mg by mouth daily.    Allergies:   Penicillins   Social History   Tobacco Use   Smoking status: Never   Smokeless tobacco:  Never  Vaping Use   Vaping Use: Never used  Substance Use Topics   Alcohol use: No   Drug use: No    Family Hx: The patient's family history includes Anxiety disorder in her father; CAD in her father and paternal grandfather; Congenital heart disease in her paternal grandfather; Congestive Heart Failure in her father; Depression in her father; Diabetes in her brother; Diabetes type II in her paternal grandfather; Heart attack in her mother; Heart disease in her father; Hypertension in her brother and paternal grandfather; Osteoporosis in her father and paternal grandfather; Stroke in her mother.  ROS   EKGs/Labs/Other Test Reviewed:    EKG:  EKG is   ordered today.  The ekg ordered today demonstrates NSR, low voltage, no change  Recent Labs: No results found for requested labs within last 365 days.   Recent Lipid Panel No results for input(s): "CHOL", "TRIG", "HDL", "VLDL", "LDLCALC", "LDLDIRECT" in the last 8760 hours.   Prior CV Studies:   2D echo 06/24/2019 IMPRESSIONS     1. Left ventricular ejection fraction, by estimation, is 60 to 65%. The  left ventricle has normal function. The left ventricle has no regional  wall motion abnormalities. There is mild concentric left ventricular  hypertrophy. Left ventricular diastolic  parameters are consistent with Grade I diastolic dysfunction (impaired  relaxation).   2. Right ventricular systolic function is normal. The right ventricular  size is normal.   3. The mitral valve is normal in structure. No evidence of mitral valve  regurgitation. No evidence of mitral stenosis.   4. The aortic valve is normal in structure. Aortic valve regurgitation is  not visualized. No aortic stenosis is present.   FINDINGS   Left Ventricle: Left ventricular ejection fraction, by estimation, is 60  to 65%. The left ventricle has normal function. The left ventricle has no  regional wall motion abnormalities. The left ventricular internal cavity   size was normal in size. There is   mild concentric left ventricular hypertrophy. Left ventricular diastolic  parameters are consistent with Grade I diastolic dysfunction (impaired  relaxation).   Right Ventricle: The right ventricular size is normal. No increase in  right ventricular wall thickness. Right ventricular systolic function is  normal.   Left Atrium: Left atrial size was normal in size.   Right Atrium: Right atrial size was normal in size.   Pericardium: There is no evidence of pericardial effusion.   Mitral Valve: The mitral valve is normal in structure. No evidence of  mitral valve regurgitation. No evidence of mitral valve  stenosis.   Tricuspid Valve: The tricuspid valve is grossly normal. Tricuspid valve  regurgitation is not demonstrated.   Aortic Valve: The aortic valve is normal in structure. Aortic valve  regurgitation is not visualized. No aortic stenosis is present.   Pulmonic Valve: The pulmonic valve was normal in structure. Pulmonic valve  regurgitation is not visualized. No evidence of pulmonic stenosis.   Aorta: The aortic root and ascending aorta are structurally normal, with  no evidence of dilitation.   IAS/Shunts: The atrial septum is grossly normal.     LEFT VENTRICLE  PLAX 2D  LVIDd:         4.97 cm  Diastology  LVIDs:         2.08 cm  LV e' lateral:   8.49 cm/s  LV PW:         1.38 cm  LV E/e' lateral: 8.1  LV IVS:        1.32 cm  LV e' medial:    9.03 cm/s  LVOT diam:     2.00 cm  LV E/e' medial:  7.6  LV SV:         63  LV SV Index:   32  LVOT Area:     3.14 cm     RIGHT VENTRICLE  RV Basal diam:  2.57 cm  RV S prime:     8.81 cm/s  TAPSE (M-mode): 1.5 cm      Lexiscan 06/24/2019 Narrative & Impression        Nuclear stress EF: 61%. There were no wall motion abnormalities There was no ST segment deviation noted during stress. Defect 1: There is a small defect of mild severity present in the apical anterior location. Small  area of ischemia cannot be excluded. This is an overall low risk study. There is a small area of possible ischemia in the apical anterior region      Risk Assessment/Calculations/Metrics:              Physical Exam:    VS:  BP 120/84   Pulse 74   Ht 5\' 3"  (1.6 m)   Wt 210 lb 6.4 oz (95.4 kg)   SpO2 98%   BMI 37.27 kg/m     Wt Readings from Last 3 Encounters:  12/28/21 210 lb 6.4 oz (95.4 kg)  07/09/19 218 lb (98.9 kg)  06/25/19 215 lb (97.5 kg)    Physical Exam  GEN:  obese, in no acute distress  Neck: no JVD, carotid bruits, or masses Cardiac:RRR; no murmurs, rubs, or gallops  Respiratory:  clear to auscultation bilaterally, normal work of breathing GI: soft, nontender, nondistended, + BS Ext: without cyanosis, clubbing, or edema, Good distal pulses bilaterally Neuro:  Alert and Oriented x 3,  Psych: euthymic mood, full affect       ASSESSMENT & PLAN:   No problem-specific Assessment & Plan notes found for this encounter.   Essential hypertension patient's blood pressure Overall well controlled on lisinopril. Blood work done recently by 06/27/19 will request a copy be sent.  HLD on lipitor-labs just checked by PCP  History of chest pain under a lot of stress.  Myoview was low risk and echo with normal LV function   Diabetes mellitus managed by PCP    chronic pain syndrome managed by PCP              Dispo:  No follow-ups on file.   Medication Adjustments/Labs and Tests Ordered: Current medicines are reviewed at  length with the patient today.  Concerns regarding medicines are outlined above.  Tests Ordered: Orders Placed This Encounter  Procedures   EKG 12-Lead   Medication Changes: No orders of the defined types were placed in this encounter.  Signed, Jacolyn Reedy, PA-C  12/28/2021 9:12 AM    [facility] [address] [place], [place], [place]  [address] Phone: [phone]; Fax: [phone]

## 2022-12-20 ENCOUNTER — Telehealth: Payer: Self-pay | Admitting: Cardiovascular Disease

## 2022-12-20 NOTE — Telephone Encounter (Signed)
Pt c/o of Chest Pain: STAT if CP now or developed within 24 hours  1. Are you having CP right now? No   2. Are you experiencing any other symptoms (ex. SOB, nausea, vomiting, sweating)? No   3. How long have you been experiencing CP? A little over a day   4. Is your CP continuous or coming and going? Coming and going   5. Have you taken Nitroglycerin? No  ?

## 2022-12-20 NOTE — Telephone Encounter (Signed)
Called patient left message on personal voice mail to call back. 

## 2022-12-21 NOTE — Telephone Encounter (Signed)
Spoke with pt who complains of intermittent CP x 3-4 months.  Denies current CP, SOB or dizziness.  Pt reports last epsiode of CP was yesterday.  It was a squeezing sensation in the center of her chest.  She felt some "numbness/coldness" radiating up her throat.  Denies diaphoresis or nausea and vomiting.  She states she took 3 baby aspirin and laid down.  Pain resolved after about 30 minutes of rest.  She does not have any current BP or HR readings. Pt would like to schedule an appointment with Dr Clifton James.  Pt advised will need to forward to Dr Gibson Ramp nurse to assist with appointment.  Encouraged to continue current medications as prescribed,  Monitor BP and HR and log.  Reviewed ED precautions.  Pt verbalizes understanding and agrees with current plan.

## 2022-12-21 NOTE — Telephone Encounter (Signed)
Called the patient and reviewed her symptoms, ER precautions. She said she will go to the ER if what occurred yesterday happens again she will go in for eval.    I scheduled her w Dr. Clifton James on 01/10/23.    Checked BP per previous nurse request at 12:30 pm today not having symptoms at the time - 138/81.

## 2022-12-21 NOTE — Telephone Encounter (Signed)
Called patient and LM to call office.

## 2022-12-21 NOTE — Telephone Encounter (Signed)
Patient returned staff call.

## 2023-01-09 NOTE — Progress Notes (Unsigned)
No chief complaint on file.  History of Present Illness: 77 yo female with history of chronic pain syndrome, diabetes mellitus, hypothyroidism, arthritis and HTN here today for cardiac follow up. I saw her as a new patient for the evaluation of chest pain in January 2020. She described chest pressure when laying in bed at night.  No worsening with exertion. No associated dyspnea. I arranged an echo and a stress test but she did not complete the studies. She called out office with recurrent chest pain in early 2021. Nuclear stress test in March 2021 was low risk. Echo March 2021 with normal LV function and no valve disease. She was seen in our office in September 2023 and did not report chest pain.   She is here today for follow up. She called our office with chest pain on 12/20/22 and was given instructions to go to the ED if the pain recurred. *** The patient denies any dyspnea, palpitations, lower extremity edema, orthopnea, PND, dizziness, near syncope or syncope.   Primary Care Physician: Chiquita Loth, PA  Past Medical History:  Diagnosis Date   Arthritis    degenerative lumbar spine, back injection with Dr. Sherene Sires office     Chronic back pain    stenosis   Closed displaced fracture of base of neck of left femur (HCC)    Closed left subtrochanteric femur fracture (HCC) 05/20/2017   Complication of anesthesia    woke up very slowly, states she was told that " we had a time waking me you up"   Constipation    takes Stool Softener daily   Diabetes mellitus without complication (HCC)    takes Amaryl and Metformin daily   Diverticulitis    DJD (degenerative joint disease) of knee 05/19/2014   Fall    Gout    takes Allopurinol daily   History of blood transfusion    no abnormal reaction noted   Hypertension    takes Lisinopril-HCTZ daily   Joint pain    Lumbar pain with radiation down right leg    Nocturia    Osteoporosis 05/23/2017   Precordial chest pain 05/09/2014   Primary  localized osteoarthritis of left knee    Primary localized osteoarthritis of right knee    Restless leg syndrome    takes Lyrica daily   S/P lumbar laminectomy 03/12/2015   Urinary frequency    Weakness    numbness and tingling in both legs   Wound dehiscence     Past Surgical History:  Procedure Laterality Date   Bladder Tacking  1981   CHOLECYSTECTOMY  1978   [facility].    ESOPHAGOGASTRODUODENOSCOPY     EYE SURGERY     cataracts removed bilateral /w IOL   HIP ARTHROPLASTY Left 05/20/2017   Procedure: ARTHROPLASTY BIPOLAR HIP (HEMIARTHROPLASTY);  Surgeon: Bjorn Pippin, MD;  Location: [facility] OR;  Service: Orthopedics;  Laterality: Left;   LUMBAR LAMINECTOMY  04/21/2015   LUMBAR LAMINECTOMY/DECOMPRESSION MICRODISCECTOMY Bilateral 03/12/2015   Procedure: Bilateral Lumbar Three-Four, Lumbar Four-Five Laminectomy and Foraminotomy ;  Surgeon: Tia Alert, MD;  Location: [facility] NEURO ORS;  Service: Neurosurgery;  Laterality: Bilateral;   LUMBAR WOUND DEBRIDEMENT N/A 04/21/2015   Procedure: Lumbar wound revision;  Surgeon: Tia Alert, MD;  Location: [facility] NEURO ORS;  Service: Neurosurgery;  Laterality: N/A;   PARTIAL HYSTERECTOMY  1998   Partial   TOTAL KNEE ARTHROPLASTY Left 05/19/2014   dr Thurston Hole   TOTAL KNEE ARTHROPLASTY Left 05/19/2014   Procedure:  LEFT TOTAL KNEE ARTHROPLASTY;  Surgeon: Nilda Simmer, MD;  Location: [facility] OR;  Service: Orthopedics;  Laterality: Left;   TUBAL LIGATION Bilateral 1980   VARICOSE VEIN SURGERY      Current Outpatient Medications  Medication Sig Dispense Refill   allopurinol (ZYLOPRIM) 300 MG tablet Take 1 tablet by mouth daily.     atorvastatin (LIPITOR) 10 MG tablet Take 1 tablet by mouth daily.     Cholecalciferol (VITAMIN D) 2000 UNITS CAPS Take 5,000 Units by mouth daily.      glimepiride (AMARYL) 1 MG tablet Take 1 mg by mouth daily with breakfast.     levothyroxine (SYNTHROID, LEVOTHROID) 25 MCG tablet Take 25 mcg by mouth daily before breakfast.      lisinopril (PRINIVIL,ZESTRIL) 5 MG tablet Take 5 mg by mouth daily.     meloxicam (MOBIC) 15 MG tablet Take 1 tablet by mouth daily.     metFORMIN (GLUCOPHAGE) 500 MG tablet Take 500 mg by mouth 2 (two) times daily with a meal.     oxyCODONE (ROXICODONE) 15 MG immediate release tablet Take 15 mg by mouth every 6 (six) hours as needed (pain).      pregabalin (LYRICA) 200 MG capsule Take 200 mg by mouth 3 (three) times daily.      rOPINIRole (REQUIP) 3 MG tablet Take 3 mg by mouth at bedtime.      vitamin B-12 (CYANOCOBALAMIN) 1000 MCG tablet Take 1,000 mcg by mouth daily.     No current facility-administered medications for this visit.    Allergies  Allergen Reactions   Penicillins Hives    Has patient had a PCN reaction causing immediate rash, facial/tongue/throat swelling, SOB or lightheadedness with hypotension: Yes/No Has patient had a PCN reaction causing severe rash involving mucus membranes or skin necrosis: Yes/No Has patient had a PCN reaction that required hospitalization Yes/No Has patient had a PCN reaction occurring within the last 10 years: Yes/No If all of the above answers are "NO", then may proceed with Cephalosporin use.     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     Social Determinants of Health   Financial Resource Strain: Not on file  Food Insecurity: Not on file  Transportation Needs: Not on file  Physical Activity: Not on file   Stress: Not on file  Social Connections: Not on file  Intimate Partner Violence: Not on file    Family History  Problem Relation Age of Onset   Stroke Mother    Heart attack Mother    Heart disease Father    Anxiety disorder Father    CAD Father    Depression Father    Osteoporosis Father    Congestive Heart Failure Father    Hypertension Brother    Diabetes Brother    CAD Paternal Grandfather    Diabetes type II Paternal Actor  Hypertension Paternal Grandfather    Osteoporosis Paternal Grandfather    Congenital heart disease Paternal Grandfather     Review of Systems:  As stated in the HPI and otherwise negative.   There were no vitals taken for this visit.  Physical Examination: General: Well developed, well nourished, NAD  HEENT: OP clear, mucus membranes moist  SKIN: warm, dry. No rashes. Neuro: No focal deficits  Musculoskeletal: Muscle strength 5/5 all ext  Psychiatric: Mood and affect normal  Neck: No JVD, no carotid bruits, no thyromegaly, no lymphadenopathy.  Lungs:Clear bilaterally, no wheezes, rhonci, crackles Cardiovascular: Regular rate and rhythm. No murmurs, gallops or rubs. Abdomen:Soft. Bowel sounds present. Non-tender.  Extremities: No lower extremity edema. Pulses are 2 + in the bilateral DP/PT.  EKG:  EKG is *** ordered today. The ekg ordered today demonstrates   Recent Labs: No results found for requested labs within last 365 days.   Lipid Panel    Component Value Date/Time   CHOL  10/04/2009 1013    122        ATP III CLASSIFICATION:  <200     mg/dL   Desirable  161-096  mg/dL   Borderline High  >=045    mg/dL   High          TRIG 56 10/04/2009 1013   HDL 47 10/04/2009 1013   CHOLHDL 2.6 10/04/2009 1013   VLDL 11 10/04/2009 1013   LDLCALC  10/04/2009 1013    64        Total Cholesterol/HDL:CHD Risk Coronary Heart Disease Risk Table                     Men   Women  1/2 Average Risk   3.4   3.3  Average Risk       5.0    4.4  2 X Average Risk   9.6   7.1  3 X Average Risk  23.4   11.0        Use the calculated Patient Ratio above and the CHD Risk Table to determine the patient's CHD Risk.        ATP III CLASSIFICATION (LDL):  <100     mg/dL   Optimal  409-811  mg/dL   Near or Above                    Optimal  130-159  mg/dL   Borderline  914-782  mg/dL   High  >956     mg/dL   Very High     Wt Readings from Last 3 Encounters:  12/28/21 95.4 kg  07/09/19 98.9 kg  06/25/19 97.5 kg     Assessment and Plan:   1. Chest pain: No exertional pain. Normal LV function in 2021. Low risk stress test in 2021.   2. HTN: BP is well controlled. No changes  3. Hyperlipidemia: Lipids followed in primary care. Continue statin  Labs/ tests ordered today include:  No orders of the defined types were placed in this encounter.  Disposition:   F/U with me in one year.   Signed, Verne Carrow, MD 01/09/2023 6:11 PM    [facility] [address] [place], [place], [place]  [address] Phone: [phone]; Fax: [phone]

## 2023-01-10 ENCOUNTER — Ambulatory Visit: Payer: Medicare HMO | Attending: Cardiovascular Disease | Admitting: Cardiovascular Disease

## 2023-01-10 ENCOUNTER — Encounter: Payer: Self-pay | Admitting: Cardiovascular Disease

## 2023-01-10 VITALS — BP 130/80 | HR 70 | Ht 62.5 in | Wt 209.6 lb

## 2023-01-10 DIAGNOSIS — R072 Precordial pain: Secondary | ICD-10-CM

## 2023-01-10 DIAGNOSIS — E782 Mixed hyperlipidemia: Secondary | ICD-10-CM

## 2023-01-10 DIAGNOSIS — R079 Chest pain, unspecified: Secondary | ICD-10-CM

## 2023-01-10 DIAGNOSIS — I10 Essential (primary) hypertension: Secondary | ICD-10-CM | POA: Diagnosis not present

## 2023-01-10 MED ORDER — METOPROLOL TARTRATE 100 MG PO TABS: 100.0000 mg | ORAL_TABLET | Freq: Once | ORAL | 0 refills | Status: DC

## 2023-01-10 NOTE — Patient Instructions (Signed)
Medication Instructions:  No changes *If you need a refill on your cardiac medications before your next appointment, please call your pharmacy*   Lab Work: Today: bmet   Testing/Procedures: Coronary CT Angiogram - see instructions below   Follow-Up: At [facility], you and your health needs are our priority.  As part of our continuing mission to provide you with exceptional heart care, we have created designated Provider Care Teams.  These Care Teams include your primary Cardiologist (physician) and Advanced Practice Providers (APPs -  Physician Assistants and Nurse Practitioners) who all work together to provide you with the care you need, when you need it.   Your next appointment:   12 month(s)  Provider:   Verne Carrow, MD        Your cardiac CT will be scheduled at  [facility] [address] [place], [place] [address] [phone]  Please arrive at the [facility]'s Entrance (Entrance C2) of [facility] 30 minutes prior to test start time. You can use the FREE valet parking offered at entrance C (encouraged to control the heart rate for the test)  Proceed to the [facility] Radiology Department (first floor) to check-in and test prep.  All radiology patients and guests should use entrance C2 at [facility], accessed from [facility], even though the hospital's physical address listed is [address]    Please follow these instructions carefully (unless otherwise directed):  An IV will be required for this test and Nitroglycerin will be given.   On the Night Before the Test: Be sure to Drink plenty of water. Do not consume any caffeinated/decaffeinated beverages or chocolate 12 hours prior to your test. Do not take any antihistamines 12 hours prior to your test.  On the Day of the Test: Drink plenty of water until 1 hour prior to the test. Do not eat any food 1 hour prior to  test. You may take your regular medications prior to the test.  Take metoprolol (Lopressor) two hours prior to test. FEMALES- please wear underwire-free bra if available, avoid dresses & tight clothing      After the Test: Drink plenty of water. After receiving IV contrast, you may experience a mild flushed feeling. This is normal. On occasion, you may experience a mild rash up to 24 hours after the test. This is not dangerous. If this occurs, you can take Benadryl 25 mg and increase your fluid intake. If you experience trouble breathing, this can be serious. If it is severe call 911 IMMEDIATELY. If it is mild, please call our office. If you take any of these medications: Glipizide/Metformin, Avandament, Glucavance, please do not take 48 hours after completing test unless otherwise instructed.  We will call to schedule your test 2-4 weeks out understanding that some insurance companies will need an authorization prior to the service being performed.   For more information and frequently asked questions, please visit our website : [email or web address]  For non-scheduling related questions, please contact the cardiac imaging nurse navigator should you have any questions/concerns: Cardiac Imaging Nurse Navigators Direct Office Dial: [phone]   For scheduling needs, including cancellations and rescheduling, please call [place], [phone].

## 2023-01-11 LAB — BASIC METABOLIC PANEL
BUN/Creatinine Ratio: 23 (ref 12–28)
BUN: 15 mg/dL (ref 8–27)
CO2: 25 mmol/L (ref 20–29)
Calcium: 9.1 mg/dL (ref 8.7–10.3)
Chloride: 102 mmol/L (ref 96–106)
Creatinine, Ser: 0.65 mg/dL (ref 0.57–1.00)
Glucose: 69 mg/dL — ABNORMAL LOW (ref 70–99)
Potassium: 4.6 mmol/L (ref 3.5–5.2)
Sodium: 140 mmol/L (ref 134–144)
eGFR: 92 mL/min/{1.73_m2} (ref >59)

## 2023-01-16 NOTE — Progress Notes (Signed)
Initial phone contact with newly diagnosed breast cancer pt. Appointment made with Dr. Georgiana Shore for surgical consult on 01/23/23. Encouraged pt to stop by and pick up "The Breast Cancer Treatment Handbook" at the [facility].Navigation contact info given.

## 2023-01-18 ENCOUNTER — Encounter (HOSPITAL_COMMUNITY): Payer: Self-pay

## 2023-01-19 ENCOUNTER — Telehealth (HOSPITAL_COMMUNITY): Payer: Self-pay | Admitting: *Deleted

## 2023-01-19 NOTE — Telephone Encounter (Signed)
Attempted to call patient regarding upcoming cardiac CT appointment. Left message on voicemail with name and callback number Hayley Sharpe RN Navigator Cardiac Imaging [facility] [phone] Office   

## 2023-01-20 ENCOUNTER — Ambulatory Visit (HOSPITAL_COMMUNITY)
Admission: RE | Admit: 2023-01-20 | Discharge: 2023-01-20 | Disposition: A | Discharge status: Home / Self Care | Payer: Medicare HMO | Source: Ambulatory Visit | Attending: Cardiovascular Disease | Admitting: Cardiovascular Disease

## 2023-01-20 DIAGNOSIS — R072 Precordial pain: Secondary | ICD-10-CM

## 2023-01-20 DIAGNOSIS — R079 Chest pain, unspecified: Secondary | ICD-10-CM

## 2023-01-20 MED ORDER — IOHEXOL 350 MG/ML SOLN
100.0000 mL | Freq: Once | INTRAVENOUS | Status: AC | PRN
  Administered 2023-01-20: 100 mL via INTRAVENOUS

## 2023-01-20 MED ORDER — NITROGLYCERIN 0.4 MG SL SUBL
SUBLINGUAL_TABLET | SUBLINGUAL | Status: AC
  Filled 2023-01-20: qty 2

## 2023-01-20 MED ORDER — NITROGLYCERIN 0.4 MG SL SUBL
0.8000 mg | SUBLINGUAL_TABLET | Freq: Once | SUBLINGUAL | Status: AC
  Administered 2023-01-20: 0.8 mg via SUBLINGUAL

## 2023-01-20 NOTE — Progress Notes (Signed)
Patient tolerated CT well.  Vital signs stable encourage to drink water throughout day.Reasons explained and verbalized understanding.   

## 2023-01-23 ENCOUNTER — Telehealth: Payer: Self-pay | Admitting: Cardiovascular Disease

## 2023-01-23 MED ORDER — ASPIRIN 81 MG PO TBEC: 81.0000 mg | DELAYED_RELEASE_TABLET | Freq: Every day | ORAL | Status: AC

## 2023-01-23 NOTE — Telephone Encounter (Signed)
Pt would like a callback to discuss results. Please advise

## 2023-01-23 NOTE — Telephone Encounter (Signed)
Spoke with Pt. Gave results, added asa 81mg  to med list and answered any questions asked. Pt stated that after CT she learned she had breast cancer and would undergo treatment for that.

## 2023-03-21 ENCOUNTER — Telehealth: Payer: Self-pay | Admitting: Oncology

## 2023-03-21 NOTE — Telephone Encounter (Signed)
03/21/23 Spoke with patient and confirmed appt

## 2023-04-09 NOTE — Progress Notes (Signed)
 [facility]  [address] [place],  [place]  [address] [phone]  Clinic Day: 04/10/2023  Referring physician: Dia Lamarr BRAVO, PA*   HISTORY OF PRESENT ILLNESS:  Amber Ashley is a 78 y.o. female who I was asked to consult upon for newly diagnosed breast cancer.  Her history dates back to August 2024 when a screening mammogram showed asymetry in her left breast.  A diagnostic mammogram done in September 2024 showed a 4 mm mass in her upper outer left breast.  A biopsy of this lesion was done in October 2024, which came back positive for a 3 mm, grade I invasive ductal carcinoma.   Receptor testing showed her tumor to be estrogen receptor positive, but progesterone receptor negative, and Her2/neu receptor negative.  Her KI-67 score was low at 1%.  Unfortunately, a large breast hematoma developed from her biopsy, which required surgery to help evacuate.  When a lumpectomy was done in December 2024, no additional disease was found.  The patient comes in today to go over her breast cancer surgical pathology and its implications.   Of note, it had been numerous years since she had her last mammogram.  There is no family history pertinent for either breast or ovarian cancer.  PAST MEDICAL HISTORY:   Past Medical History:  Diagnosis Date   Arthritis    degenerative lumbar spine, back injection with Dr. Loraine office     Breast cancer [facility])    Chronic back pain    stenosis   Closed displaced fracture of base of neck of left femur (HCC)    Closed left subtrochanteric femur fracture (HCC) 05/20/2017   Complication of anesthesia    woke up very slowly, states she was told that  we had a time waking me you up   Constipation    takes Stool Softener daily   Diabetes mellitus without complication (HCC)    takes Amaryl  and Metformin  daily   Diverticulitis    DJD (degenerative joint disease) of knee 05/19/2014   Fall    Gout    takes Allopurinol  daily    History of blood transfusion    no abnormal reaction noted   Hypertension    takes Lisinopril -HCTZ daily   Joint pain    Lumbar pain with radiation down right leg    Nocturia    Osteoporosis 05/23/2017   Precordial chest pain 05/09/2014   Primary localized osteoarthritis of left knee    Primary localized osteoarthritis of right knee    Restless leg syndrome    takes Lyrica  daily   S/P lumbar laminectomy 03/12/2015   Urinary frequency    Weakness    numbness and tingling in both legs   Wound dehiscence     PAST SURGICAL HISTORY:   Past Surgical History:  Procedure Laterality Date   BACK SURGERY     Bladder Tacking  1981   BREAST BIOPSY Left    BREAST LUMPECTOMY Left    HEMATOMA REMOVED PRIOR TO LUMPECTOMY   CARDIAC CATHETERIZATION     CHOLECYSTECTOMY  1978   [facility].    ESOPHAGOGASTRODUODENOSCOPY     EYE SURGERY     cataracts removed bilateral /w IOL   HIP ARTHROPLASTY Left 05/20/2017   Procedure: ARTHROPLASTY BIPOLAR HIP (HEMIARTHROPLASTY);  Surgeon: Cristy Bonner DASEN, MD;  Location: [facility] OR;  Service: Orthopedics;  Laterality: Left;   LUMBAR LAMINECTOMY  04/21/2015   LUMBAR LAMINECTOMY/DECOMPRESSION MICRODISCECTOMY Bilateral 03/12/2015   Procedure: Bilateral Lumbar Three-Four,  Lumbar Four-Five Laminectomy and Foraminotomy ;  Surgeon: Alm GORMAN Molt, MD;  Location: [facility] NEURO ORS;  Service: Neurosurgery;  Laterality: Bilateral;   LUMBAR WOUND DEBRIDEMENT N/A 04/21/2015   Procedure: Lumbar wound revision;  Surgeon: Alm GORMAN Molt, MD;  Location: [facility]UR[facility] ORS;  Service: Neurosurgery;  Laterality: N/A;   PARTIAL HYSTERECTOMY  1998   Partial   TOTAL KNEE ARTHROPLASTY Left 05/19/2014   dr jane   TOTAL KNEE ARTHROPLASTY Left 05/19/2014   Procedure: LEFT TOTAL KNEE ARTHROPLASTY;  Surgeon: [name], MD;  Location: [facility] OR;  Service: Orthopedics;  Laterality: Left;   TUBAL LIGATION Bilateral 1980   VARICOSE VEIN SURGERY      CURRENT MEDICATIONS:   Current Outpatient  Medications  Medication Sig Dispense Refill   allopurinol  (ZYLOPRIM ) 300 MG tablet Take 1 tablet by mouth daily.     anastrozole  (ARIMIDEX ) 1 MG tablet Take 1 tablet (1 mg total) by mouth daily. 90 tablet 3   aspirin  EC 81 MG tablet Take 1 tablet (81 mg total) by mouth daily. Swallow whole.     atorvastatin (LIPITOR) 10 MG tablet Take 1 tablet by mouth daily.     Cholecalciferol  (VITAMIN D ) 2000 UNITS CAPS Take 5,000 Units by mouth daily.      diclofenac  Sodium (VOLTAREN ) 1 % GEL Apply topically.     glimepiride  (AMARYL ) 1 MG tablet Take 1 mg by mouth daily with breakfast.     lisinopril  (PRINIVIL ,ZESTRIL ) 5 MG tablet Take 5 mg by mouth daily.     meloxicam  (MOBIC ) 15 MG tablet Take 1 tablet by mouth daily.     metFORMIN  (GLUCOPHAGE ) 500 MG tablet Take 500 mg by mouth 2 (two) times daily with a meal.     oxyCODONE  (ROXICODONE ) 15 MG immediate release tablet Take 15 mg by mouth every 6 (six) hours as needed (pain).      pregabalin  (LYRICA ) 200 MG capsule Take 200 mg by mouth 3 (three) times daily.      PROLIA  60 MG/ML SOSY injection Inject into the skin every 6 (six) months.     rOPINIRole  (REQUIP ) 3 MG tablet Take 3 mg by mouth at bedtime.      vitamin B-12 (CYANOCOBALAMIN) 1000 MCG tablet Take 1,000 mcg by mouth daily.     No current facility-administered medications for this visit.    ALLERGIES:   Allergies  Allergen Reactions   Penicillins Hives    Has patient had a PCN reaction causing immediate rash, facial/tongue/throat swelling, SOB or lightheadedness with hypotension: Yes/No Has patient had a PCN reaction causing severe rash involving mucus membranes or skin necrosis: Yes/No Has patient had a PCN reaction that required hospitalization Yes/No Has patient had a PCN reaction occurring within the last 10 years: Yes/No If all of the above answers are NO, then may proceed with Cephalosporin use.     FAMILY HISTORY:   Family History  Problem Relation Age of Onset   Stroke  Mother    Heart attack Mother    Heart disease Father    Anxiety disorder Father    CAD Father    Depression Father    Osteoporosis Father    Congestive Heart Failure Father    Other Father        DIED FROM SUICIDE   Lung disease Sister    Heart disease Sister    Hypertension Brother    Diabetes Brother    Diabetes Brother    Hypertension Brother    Lung disease Brother  CAD Paternal Grandfather    Diabetes type II Paternal Grandfather    Hypertension Paternal Grandfather    Osteoporosis Paternal Grandfather    Congenital heart disease Paternal Grandfather     SOCIAL HISTORY:  The patient was born and raised in [place].  She lives in [place] with her husband of 59 years.  They have 3 children, 9 grandchildren and 2 great grandchildren.  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  There is no history of alcohol or tobacco abuse.   REVIEW OF SYSTEMS:  Review of Systems  Constitutional:  Negative for fatigue and fever.  HENT:   Negative for hearing loss and sore throat.   Eyes:  Negative for eye problems.  Respiratory:  Negative for chest tightness, cough and hemoptysis.   Cardiovascular:  Negative for chest pain and palpitations.  Gastrointestinal:  Negative for abdominal distention, abdominal pain, blood in stool, constipation, diarrhea, nausea and vomiting.  Endocrine: Negative for hot flashes.  Genitourinary:  Negative for difficulty urinating, dysuria, frequency, hematuria and nocturia.   Musculoskeletal:  Positive for arthralgias. Negative for back pain, gait problem and myalgias.  Skin: Negative.  Negative for itching and rash.  Neurological: Negative.  Negative for dizziness, extremity weakness, gait problem, headaches, light-headedness and numbness.  Hematological: Negative.   Psychiatric/Behavioral: Negative.  Negative for depression and suicidal ideas. The patient is not nervous/anxious.     PHYSICAL EXAM:  Blood pressure (!) 175/83, pulse 79,  temperature (!) 97.4 F (36.3 C), temperature source Oral, resp. rate 16, height 5' 3 (1.6 m), weight 221 lb 3.2 oz (100.3 kg), SpO2 100%. Wt Readings from Last 3 Encounters:  04/10/23 221 lb 3.2 oz (100.3 kg)  01/10/23 209 lb 9.6 oz (95.1 kg)  12/28/21 210 lb 6.4 oz (95.4 kg)   Body mass index is 39.18 kg/m. Performance status (ECOG): 1 - Symptomatic but completely ambulatory Physical Exam Constitutional:      Appearance: Normal appearance.  HENT:     Mouth/Throat:     Pharynx: Oropharynx is clear. No oropharyngeal exudate.  Cardiovascular:     Rate and Rhythm: Normal rate and regular rhythm.     Heart sounds: No murmur heard.    No friction rub. No gallop.  Pulmonary:     Breath sounds: Normal breath sounds.  Chest:  Breasts:    Right: No swelling, bleeding, inverted nipple, mass, nipple discharge or skin change.     Left: No swelling, bleeding, inverted nipple, mass, nipple discharge or skin change.     Comments: Her left breast has healed well from her recent lumpectomy and hematoma evacuation.   Abdominal:     General: Bowel sounds are normal. There is no distension.     Palpations: Abdomen is soft. There is no mass.     Tenderness: There is no abdominal tenderness.  Musculoskeletal:        General: No tenderness.     Cervical back: Normal range of motion and neck supple.     Right lower leg: No edema.     Left lower leg: No edema.  Lymphadenopathy:     Cervical: No cervical adenopathy.     Right cervical: No superficial, deep or posterior cervical adenopathy.    Left cervical: No superficial, deep or posterior cervical adenopathy.     Upper Body:     Right upper body: No supraclavicular or axillary adenopathy.     Left upper body: No supraclavicular or axillary adenopathy.     Lower  Body: No right inguinal adenopathy. No left inguinal adenopathy.  Skin:    Coloration: Skin is not jaundiced.     Findings: No lesion or rash.  Neurological:     General: No focal  deficit present.     Mental Status: She is alert and oriented to person, place, and time. Mental status is at baseline.  Psychiatric:        Mood and Affect: Mood normal.        Behavior: Behavior normal.        Thought Content: Thought content normal.        Judgment: Judgment normal.    ASSESSMENT & PLAN:   LATEASHA BREUER is a 78 y.o. female who I was asked to consult upon for newly diagnosed stage IA (T1a N0 M0) hormone positive breast cancer, status post a left breast lumpectomy in December 2024.  In clinic today, I went over all of her breast cancer pathology with her.  There is nothing particularly ominous with her pathology to where I believe either adjuvant chemotherapy or radiation is necessary.  I will place her on anastrozole , which she will take daily x 5 years for her adjuvant endocrine therapy.  Moving forward, I will follow her every 4 months with clinical breast exams.  She will get back to undergoing annual mammograms for her routine radiographic breast cancer surveillance.  Clinically, she is doing well.  I will see her back in May 2025 for her next clinical breast exam.  The patient understands all the plans discussed today and is in agreement with them.  I do appreciate Dia Lamarr BRAVO, PA* for his new consult. so   Teran Daughenbaugh DELENA Kerns, MD

## 2023-04-10 ENCOUNTER — Telehealth: Payer: Self-pay | Admitting: Oncology

## 2023-04-10 ENCOUNTER — Other Ambulatory Visit: Payer: Medicare HMO

## 2023-04-10 ENCOUNTER — Other Ambulatory Visit: Payer: Self-pay | Admitting: Oncology

## 2023-04-10 ENCOUNTER — Encounter: Payer: Self-pay | Admitting: Oncology

## 2023-04-10 ENCOUNTER — Inpatient Hospital Stay: Payer: Medicare HMO | Attending: Oncology | Admitting: Oncology

## 2023-04-10 VITALS — BP 175/83 | HR 79 | Temp 97.4°F | Resp 16 | Ht 63.0 in | Wt 221.2 lb

## 2023-04-10 DIAGNOSIS — C50412 Malignant neoplasm of upper-outer quadrant of left female breast: Secondary | ICD-10-CM | POA: Diagnosis not present

## 2023-04-10 DIAGNOSIS — Z170 Estrogen receptor positive status [ER+]: Secondary | ICD-10-CM | POA: Diagnosis not present

## 2023-04-10 DIAGNOSIS — Z1722 Progesterone receptor negative status: Secondary | ICD-10-CM | POA: Diagnosis not present

## 2023-04-10 MED ORDER — ANASTROZOLE 1 MG PO TABS: 1.0000 mg | ORAL_TABLET | Freq: Every day | ORAL | 3 refills | Status: DC

## 2023-04-10 NOTE — Telephone Encounter (Signed)
 Patient has been scheduled for follow-up visit per 04/10/23 LOS.  Pt given an appt calendar with date and time.

## 2023-04-10 NOTE — Progress Notes (Signed)
 Face to face contact with pt in [facility] lobby. Pt is here for Med Onc consult with Dr. Melvyn Neth. Pt reports that she has done well with her surgery. Discussed support group meeting coming up on 04/20/23. Encouraged pt to call with questions or concerns.

## 2023-04-11 DIAGNOSIS — C50412 Malignant neoplasm of upper-outer quadrant of left female breast: Secondary | ICD-10-CM | POA: Insufficient documentation

## 2023-04-27 ENCOUNTER — Other Ambulatory Visit: Payer: Self-pay

## 2023-04-27 NOTE — Progress Notes (Signed)
This information is for discussion at Tumor Board only and is not considered part of the official treatment plan.

## 2023-05-18 ENCOUNTER — Other Ambulatory Visit: Payer: Self-pay | Admitting: Oncology

## 2023-05-18 DIAGNOSIS — M810 Age-related osteoporosis without current pathological fracture: Secondary | ICD-10-CM

## 2023-05-18 DIAGNOSIS — C7951 Secondary malignant neoplasm of bone: Secondary | ICD-10-CM

## 2023-05-18 MED ORDER — EXEMESTANE 25 MG PO TABS: 25.0000 mg | ORAL_TABLET | Freq: Every day | ORAL | 3 refills | Status: AC

## 2023-07-27 ENCOUNTER — Other Ambulatory Visit: Payer: Self-pay

## 2023-07-27 ENCOUNTER — Emergency Department (HOSPITAL_COMMUNITY)
Admission: EM | Admit: 2023-07-27 | Discharge: 2023-07-27 | Disposition: A | Discharge status: Home / Self Care | Attending: Emergency Medicine | Admitting: Emergency Medicine

## 2023-07-27 ENCOUNTER — Emergency Department (HOSPITAL_COMMUNITY)

## 2023-07-27 DIAGNOSIS — Z7982 Long term (current) use of aspirin: Secondary | ICD-10-CM | POA: Diagnosis not present

## 2023-07-27 DIAGNOSIS — M25562 Pain in left knee: Secondary | ICD-10-CM

## 2023-07-27 DIAGNOSIS — M150 Primary generalized (osteo)arthritis: Secondary | ICD-10-CM | POA: Diagnosis not present

## 2023-07-27 DIAGNOSIS — M25561 Pain in right knee: Secondary | ICD-10-CM

## 2023-07-27 DIAGNOSIS — R030 Elevated blood-pressure reading, without diagnosis of hypertension: Secondary | ICD-10-CM

## 2023-07-27 MED ORDER — DICLOFENAC SODIUM 1 % EX GEL: 2.0000 g | Freq: Three times a day (TID) | CUTANEOUS | 0 refills | Status: DC | PRN

## 2023-07-27 MED ORDER — METHOCARBAMOL 750 MG PO TABS: 750.0000 mg | ORAL_TABLET | Freq: Three times a day (TID) | ORAL | 0 refills | Status: AC | PRN

## 2023-07-27 MED ORDER — HYDROMORPHONE HCL 1 MG/ML IJ SOLN
0.5000 mg | Freq: Once | INTRAMUSCULAR | Status: AC
  Administered 2023-07-27: 0.5 mg via INTRAVENOUS

## 2023-07-27 MED ORDER — HYDROMORPHONE HCL 1 MG/ML IJ SOLN
1.0000 mg | Freq: Once | INTRAMUSCULAR | Status: DC
  Filled 2023-07-27: qty 1

## 2023-07-27 NOTE — ED Provider Notes (Signed)
 [place] EMERGENCY DEPARTMENT AT [facility] Provider Note   CSN: [id number] Arrival date & time: 07/27/23  1747     History  Chief Complaint  Patient presents with   Knee Injury    Amber Ashley is a 78 y.o. female.  Pt c/o pain to bil knees left > right on chronic basis, but worse in past week. Denies trauma, fall or injury. Prior left tka ~ 9 yrs ago (Dr Jinger Mount). Pt denies fever, chills or sweats. No redness or new swelling. States pain today was moving downwards from left knee so became concerned about possible blood clot. No calf pain/swelling. No hx dvt. No chest pain or sob. Pt indicates takes oxycodone  prn for her knee pain, last took around noon today. No recent injections.   The history is provided by the patient, medical records, a relative and the EMS personnel.       Home Medications Prior to Admission medications   Medication Sig Start Date End Date Taking? Authorizing Provider  diclofenac  Sodium (VOLTAREN  ARTHRITIS PAIN) 1 % GEL Apply 2 g topically 3 (three) times daily as needed. 07/27/23  Yes Guadalupe Lee, MD  methocarbamol  (ROBAXIN ) 750 MG tablet Take 1 tablet (750 mg total) by mouth 3 (three) times daily as needed (muscle spasm/pain). 07/27/23  Yes Guadalupe Lee, MD  allopurinol  (ZYLOPRIM ) 300 MG tablet Take 1 tablet by mouth daily. 05/29/19   [provider]  aspirin  EC 81 MG tablet Take 1 tablet (81 mg total) by mouth daily. Swallow whole. 01/23/23   Odie Benne, MD  atorvastatin (LIPITOR) 10 MG tablet Take 1 tablet by mouth daily. 05/29/19   [provider]  Cholecalciferol  (VITAMIN D ) 2000 UNITS CAPS Take 5,000 Units by mouth daily.     [provider]  exemestane  (AROMASIN ) 25 MG tablet Take 1 tablet (25 mg total) by mouth daily after breakfast. 05/18/23   Harles Lied, Dequincy A, MD  glimepiride  (AMARYL ) 1 MG tablet Take 1 mg by mouth daily with breakfast.    [provider]  lisinopril   (PRINIVIL ,ZESTRIL ) 5 MG tablet Take 5 mg by mouth daily.    [provider]  meloxicam  (MOBIC ) 15 MG tablet Take 1 tablet by mouth daily. 01/14/19   [provider]  metFORMIN  (GLUCOPHAGE ) 500 MG tablet Take 500 mg by mouth 2 (two) times daily with a meal.    [provider]  oxyCODONE  (ROXICODONE ) 15 MG immediate release tablet Take 15 mg by mouth every 6 (six) hours as needed (pain).     [provider]  pregabalin  (LYRICA ) 200 MG capsule Take 200 mg by mouth 3 (three) times daily.     [provider]  PROLIA 60 MG/ML SOSY injection Inject into the skin every 6 (six) months. 11/02/22   [provider]  rOPINIRole  (REQUIP ) 3 MG tablet Take 3 mg by mouth at bedtime.     [provider]  vitamin B-12 (CYANOCOBALAMIN) 1000 MCG tablet Take 1,000 mcg by mouth daily.    [provider]      Allergies    Penicillins    Review of Systems   Review of Systems  Constitutional:  Negative for chills and fever.  Respiratory:  Negative for shortness of breath.   Cardiovascular:  Negative for chest pain.  Musculoskeletal:  Negative for back pain.  Skin:  Negative for rash.  Neurological:  Negative for weakness and numbness.    Physical Exam Updated Vital Signs BP (!) 148/99  Pulse 72   Temp 97.9 F (36.6 C) (Oral)   Resp 18   Ht 1.575 m (5\' 2" )   Wt 102.1 kg   SpO2 100%   BMI 41.15 kg/m  Physical Exam Vitals and nursing note reviewed.  Constitutional:      Appearance: Normal appearance. She is well-developed.  HENT:     Head: Atraumatic.  Eyes:     General: No scleral icterus.    Conjunctiva/sclera: Conjunctivae normal.  Neck:     Trachea: No tracheal deviation.  Cardiovascular:     Rate and Rhythm: Normal rate and regular rhythm.     Pulses: Normal pulses.     Heart sounds: No murmur heard.    No friction rub. No gallop.  Pulmonary:     Effort: Pulmonary effort is normal. No respiratory distress.   Musculoskeletal:        General: No swelling.     Cervical back: No muscular tenderness.     Comments: Well healed remote surgical scar left knee, no sign of infection. No large effusion. No erythema or increased warmth. Good, slow, passive rom bil knees without severe pain. +crepitus/clicking w rom bil knees. No calf swelling/tenderness. Distal pulses palp bil. No asymmetric swelling to either leg. Passive rom at hips/ankles without pain.   Skin:    General: Skin is warm and dry.     Findings: No rash.  Neurological:     Mental Status: She is alert.     Comments: Alert, speech normal. Bil lower ext nvi, w grossly intact motor/sens fxn.   Psychiatric:        Mood and Affect: Mood normal.     ED Results / Procedures / Treatments   Labs (all labs ordered are listed, but only abnormal results are displayed) Labs Reviewed - No data to display  EKG None  Radiology DG Knee Complete 4 Views Right Result Date: 07/27/2023 CLINICAL DATA:  Pain. EXAM: RIGHT KNEE - COMPLETE 4+ VIEW COMPARISON:  08/10/2013. FINDINGS: No acute fracture or dislocation. Small joint effusion. Moderate-to-severe tricompartmental degenerative changes with complete joint space loss in the lateral femorotibial compartment, resulting in bone-on-bone contact with subchondral sclerosis. Tricompartmental osteophytosis. These findings are progressed since the prior exam. IMPRESSION: 1. No acute osseous abnormality. 2. Moderate-to-severe tricompartmental degenerative changes of the knee, most pronounced in the lateral femorotibial compartment. Electronically Signed   By: Mannie Seek M.D.   On: 07/27/2023 21:06   DG Knee Complete 4 Views Left Result Date: 07/27/2023 CLINICAL DATA:  Pain. EXAM: LEFT KNEE - COMPLETE 4+ VIEW COMPARISON:  05/20/2017. FINDINGS: Status post left total knee arthroplasty. The femoral and tibial components appear well seated with appropriate alignment. No acute fracture, dislocation, or significant  joint effusion. IMPRESSION: 1. No acute osseous abnormality. 2. Intact left total knee arthroplasty. Electronically Signed   By: Mannie Seek M.D.   On: 07/27/2023 21:04    Procedures Procedures    Medications Ordered in ED Medications  HYDROmorphone  (DILAUDID ) injection 0.5 mg (0.5 mg Intravenous Given 07/27/23 2101)    ED Course/ Medical Decision Making/ A&P                                 Medical Decision Making Problems Addressed: Acute pain of left knee: acute illness or injury    Details: Acute/chronic Acute pain of right knee: acute illness or injury    Details: Acute/chronic Elevated blood pressure reading: acute illness  or injury Primary osteoarthritis involving multiple joints: chronic illness or injury with exacerbation, progression, or side effects of treatment that poses a threat to life or bodily functions  Amount and/or Complexity of Data Reviewed Independent Historian:     Details: Family, hx External Data Reviewed: notes. Radiology: ordered and independent interpretation performed. Decision-making details documented in ED Course.  Risk Prescription drug management.   Imaging ordered. Dilaudid  IM.   Pt indicates wanted to make sure no clot in left leg. No swelling noted. Symptoms appear most c/w msk/joint pain. Discussed that vascular u/s currently not available, will have return in AM for study.   Reviewed nursing notes and prior charts for additional history. External reports reviewed.  Xrays reviewed/interpreted by me - chr degen changes, no acute process.   Rec close pcp/ortho f/u.  Pt requests to go to new/different ortho group - pt provided that info, to call office for appt.   Return precautions provided.          Final Clinical Impression(s) / ED Diagnoses Final diagnoses:  Acute pain of right knee  Acute pain of left knee  Primary osteoarthritis involving multiple joints    Rx / DC Orders ED Discharge Orders          Ordered     LE VENOUS        07/27/23 2140    methocarbamol  (ROBAXIN ) 750 MG tablet  3 times daily PRN        07/27/23 2141    diclofenac  Sodium (VOLTAREN  ARTHRITIS PAIN) 1 % GEL  3 times daily PRN        07/27/23 2143              Emmert Roethler, MD 07/27/23 2149

## 2023-07-27 NOTE — Discharge Instructions (Addendum)
 It was our pleasure to provide your ER care today - we hope that you feel better.  Your xrays show significant chronic degenerative changes in the right knee, but otherwise do not show any new or acute process.  As we discussed the ultrasound test to rule out blood clot (DVT) is not currently available - return tomorrow AM for vascular study.   Take your pain medication as need. Try voltaren  gel as need, and you may try taking  robaxin  as need for symptom relief - no driving when taking.   Follow up with orthopedic doctor in the coming week - call office tomorrow AM to arrange appointment time.   Your blood pressure is high tonight - follow up with primary care doctor in 1-2 weeks.   Return to ER if worse, new symptoms, fevers, increased swelling/redness, intractable pain, chest pain, trouble breathing or other concern.

## 2023-07-27 NOTE — ED Triage Notes (Signed)
 Patient to ED by EMS from home with c/o L knee pain. Per EMS patient has swelling and pain to L knee she has had HX of knee replacements to this area and thinks it may be a clot. Area is tender tot ouch but non-pitting. EMS started 20g in R AC and gave 50mcg of Fentanyl . HX of diabetes.  111/48 97 98% RA CBG: 148

## 2023-07-28 ENCOUNTER — Ambulatory Visit (HOSPITAL_COMMUNITY)
Admission: RE | Admit: 2023-07-28 | Discharge: 2023-07-28 | Disposition: A | Discharge status: Home / Self Care | Source: Ambulatory Visit | Attending: Emergency Medicine | Admitting: Emergency Medicine

## 2023-07-28 DIAGNOSIS — M7989 Other specified soft tissue disorders: Secondary | ICD-10-CM | POA: Diagnosis not present

## 2023-07-28 DIAGNOSIS — Z9889 Other specified postprocedural states: Secondary | ICD-10-CM | POA: Diagnosis not present

## 2023-07-28 DIAGNOSIS — M79605 Pain in left leg: Secondary | ICD-10-CM | POA: Diagnosis present

## 2023-08-07 NOTE — Progress Notes (Unsigned)
 [facility]  [address] [place],  [place]  [address] [phone]  Clinic Day: 04/10/2023  Referring physician: Powell Broad, PA*   HISTORY OF PRESENT ILLNESS:  Amber Ashley is a 78 y.o. female with stage IA (T1a N0 M0) hormone positive breast cancer, status post a left breast lumpectomy in December 2024.  She is currently taking anastrozole  for her adjuvant endocrine therapy.  PAST MEDICAL HISTORY:   Past Medical History:  Diagnosis Date   Arthritis    degenerative lumbar spine, back injection with Dr. Adelia [place] office     Breast cancer [facility])    Chronic back pain    stenosis   Closed displaced fracture of base of neck of left femur (HCC)    Closed left subtrochanteric femur fracture (HCC) 05/20/2017   Complication of anesthesia    woke up very slowly, states she was told that " we had a time waking me you up"   Constipation    takes Stool Softener daily   Diabetes mellitus without complication (HCC)    takes Amaryl  and Metformin  daily   Diverticulitis    DJD (degenerative joint disease) of knee 05/19/2014   Fall    Gout    takes Allopurinol  daily   History of blood transfusion    no abnormal reaction noted   Hypertension    takes Lisinopril -HCTZ daily   Joint pain    Lumbar pain with radiation down right leg    Nocturia    Osteoporosis 05/23/2017   Precordial chest pain 05/09/2014   Primary localized osteoarthritis of left knee    Primary localized osteoarthritis of right knee    Restless leg syndrome    takes Lyrica  daily   S/P lumbar laminectomy 03/12/2015   Urinary frequency    Weakness    numbness and tingling in both legs   Wound dehiscence     PAST SURGICAL HISTORY:   Past Surgical History:  Procedure Laterality Date   BACK SURGERY     Bladder Tacking  1981   BREAST BIOPSY Left    BREAST LUMPECTOMY Left    HEMATOMA REMOVED PRIOR TO LUMPECTOMY   CARDIAC CATHETERIZATION     CHOLECYSTECTOMY  1978    [facility].    ESOPHAGOGASTRODUODENOSCOPY     EYE SURGERY     cataracts removed bilateral /w IOL   HIP ARTHROPLASTY Left 05/20/2017   Procedure: ARTHROPLASTY BIPOLAR HIP (HEMIARTHROPLASTY);  Surgeon: Micheline Ahr, MD;  Location: [facility] OR;  Service: Orthopedics;  Laterality: Left;   LUMBAR LAMINECTOMY  04/21/2015   LUMBAR LAMINECTOMY/DECOMPRESSION MICRODISCECTOMY Bilateral 03/12/2015   Procedure: Bilateral Lumbar Three-Four, Lumbar Four-Five Laminectomy and Foraminotomy ;  Surgeon: Isadora Mar, MD;  Location: [facility];  Service: Neurosurgery;  Laterality: Bilateral;   LUMBAR WOUND DEBRIDEMENT N/A 04/21/2015   Procedure: Lumbar wound revision;  Surgeon: Isadora Mar, MD;  Location: [facility];  Service: Neurosurgery;  Laterality: N/A;   PARTIAL HYSTERECTOMY  1998   Partial   TOTAL KNEE ARTHROPLASTY Left 05/19/2014   dr Jinger Mount   TOTAL KNEE ARTHROPLASTY Left 05/19/2014   Procedure: LEFT TOTAL KNEE ARTHROPLASTY;  Surgeon: Genevie Kerns, MD;  Location: [facility] OR;  Service: Orthopedics;  Laterality: Left;   TUBAL LIGATION Bilateral 1980   VARICOSE VEIN SURGERY      CURRENT MEDICATIONS:   Current Outpatient Medications  Medication Sig Dispense Refill   allopurinol  (ZYLOPRIM ) 300 MG tablet Take 1 tablet by mouth daily.  aspirin  EC 81 MG tablet Take 1 tablet (81 mg total) by mouth daily. Swallow whole.     atorvastatin (LIPITOR) 10 MG tablet Take 1 tablet by mouth daily.     Cholecalciferol  (VITAMIN D ) 2000 UNITS CAPS Take 5,000 Units by mouth daily.      diclofenac  Sodium (VOLTAREN  ARTHRITIS PAIN) 1 % GEL Apply 2 g topically 3 (three) times daily as needed. 100 g 0   exemestane  (AROMASIN ) 25 MG tablet Take 1 tablet (25 mg total) by mouth daily after breakfast. 90 tablet 3   glimepiride  (AMARYL ) 1 MG tablet Take 1 mg by mouth daily with breakfast.     lisinopril  (PRINIVIL ,ZESTRIL ) 5 MG tablet Take 5 mg by mouth daily.     meloxicam  (MOBIC ) 15 MG tablet Take 1 tablet by mouth daily.      metFORMIN  (GLUCOPHAGE ) 500 MG tablet Take 500 mg by mouth 2 (two) times daily with a meal.     methocarbamol  (ROBAXIN ) 750 MG tablet Take 1 tablet (750 mg total) by mouth 3 (three) times daily as needed (muscle spasm/pain). 15 tablet 0   oxyCODONE  (ROXICODONE ) 15 MG immediate release tablet Take 15 mg by mouth every 6 (six) hours as needed (pain).      pregabalin  (LYRICA ) 200 MG capsule Take 200 mg by mouth 3 (three) times daily.      PROLIA 60 MG/ML SOSY injection Inject into the skin every 6 (six) months.     rOPINIRole  (REQUIP ) 3 MG tablet Take 3 mg by mouth at bedtime.      vitamin B-12 (CYANOCOBALAMIN) 1000 MCG tablet Take 1,000 mcg by mouth daily.     No current facility-administered medications for this visit.    ALLERGIES:   Allergies  Allergen Reactions   Penicillins Hives    Has patient had a PCN reaction causing immediate rash, facial/tongue/throat swelling, SOB or lightheadedness with hypotension: Yes/No Has patient had a PCN reaction causing severe rash involving mucus membranes or skin necrosis: Yes/No Has patient had a PCN reaction that required hospitalization Yes/No Has patient had a PCN reaction occurring within the last 10 years: Yes/No If all of the above answers are "NO", then may proceed with Cephalosporin use.     FAMILY HISTORY:   Family History  Problem Relation Age of Onset   Stroke Mother    Heart attack Mother    Heart disease Father    Anxiety disorder Father    CAD Father    Depression Father    Osteoporosis Father    Congestive Heart Failure Father    Other Father        DIED FROM SUICIDE   Lung disease Sister    Heart disease Sister    Hypertension Brother    Diabetes Brother    Diabetes Brother    Hypertension Brother    Lung disease Brother    CAD Paternal Grandfather    Diabetes type II Paternal Grandfather    Hypertension Paternal Grandfather    Osteoporosis Paternal Grandfather    Congenital heart disease Paternal Grandfather      SOCIAL HISTORY:  The patient was born and raised in [place].  She lives in [place] with her husband of 59 years.  They have 3 children, 9 grandchildren and 2 great grandchildren.  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  There is no history of alcohol or tobacco abuse.   REVIEW OF SYSTEMS:  Review of Systems  Constitutional:  Negative for fatigue and fever.  HENT:   Negative for hearing loss and sore throat.   Eyes:  Negative for eye problems.  Respiratory:  Negative for chest tightness, cough and hemoptysis.   Cardiovascular:  Negative for chest pain and palpitations.  Gastrointestinal:  Negative for abdominal distention, abdominal pain, blood in stool, constipation, diarrhea, nausea and vomiting.  Endocrine: Negative for hot flashes.  Genitourinary:  Negative for difficulty urinating, dysuria, frequency, hematuria and nocturia.   Musculoskeletal:  Positive for arthralgias. Negative for back pain, gait problem and myalgias.  Skin: Negative.  Negative for itching and rash.  Neurological: Negative.  Negative for dizziness, extremity weakness, gait problem, headaches, light-headedness and numbness.  Hematological: Negative.   Psychiatric/Behavioral: Negative.  Negative for depression and suicidal ideas. The patient is not nervous/anxious.     PHYSICAL EXAM:  There were no vitals taken for this visit. Wt Readings from Last 3 Encounters:  07/27/23 225 lb (102.1 kg)  04/10/23 221 lb 3.2 oz (100.3 kg)  01/10/23 209 lb 9.6 oz (95.1 kg)   There is no height or weight on file to calculate BMI. Performance status (ECOG): 1 - Symptomatic but completely ambulatory Physical Exam Constitutional:      Appearance: Normal appearance.  HENT:     Mouth/Throat:     Pharynx: Oropharynx is clear. No oropharyngeal exudate.  Cardiovascular:     Rate and Rhythm: Normal rate and regular rhythm.     Heart sounds: No murmur heard.    No friction rub. No gallop.  Pulmonary:      Breath sounds: Normal breath sounds.  Chest:  Breasts:    Right: No swelling, bleeding, inverted nipple, mass, nipple discharge or skin change.     Left: No swelling, bleeding, inverted nipple, mass, nipple discharge or skin change.     Comments: Her left breast has healed well from her recent lumpectomy and hematoma evacuation.   Abdominal:     General: Bowel sounds are normal. There is no distension.     Palpations: Abdomen is soft. There is no mass.     Tenderness: There is no abdominal tenderness.  Musculoskeletal:        General: No tenderness.     Cervical back: Normal range of motion and neck supple.     Right lower leg: No edema.     Left lower leg: No edema.  Lymphadenopathy:     Cervical: No cervical adenopathy.     Right cervical: No superficial, deep or posterior cervical adenopathy.    Left cervical: No superficial, deep or posterior cervical adenopathy.     Upper Body:     Right upper body: No supraclavicular or axillary adenopathy.     Left upper body: No supraclavicular or axillary adenopathy.     Lower Body: No right inguinal adenopathy. No left inguinal adenopathy.  Skin:    Coloration: Skin is not jaundiced.     Findings: No lesion or rash.  Neurological:     General: No focal deficit present.     Mental Status: She is alert and oriented to person, place, and time. Mental status is at baseline.  Psychiatric:        Mood and Affect: Mood normal.        Behavior: Behavior normal.        Thought Content: Thought content normal.        Judgment: Judgment normal.    ASSESSMENT & PLAN:   Amber Ashley is a 78 y.o. female who I was asked to consult  upon for newly diagnosed stage IA (T1a N0 M0) hormone positive breast cancer, status post a left breast lumpectomy in December 2024.  In clinic today, I went over all of her breast cancer pathology with her.  There is nothing particularly ominous with her pathology to where I believe either adjuvant chemotherapy or  radiation is necessary.  I will place her on anastrozole , which she will take daily x 5 years for her adjuvant endocrine therapy.  Moving forward, I will follow her every 4 months with clinical breast exams.  She will get back to undergoing annual mammograms for her routine radiographic breast cancer surveillance.  Clinically, she is doing well.  I will see her back in May 2025 for her next clinical breast exam.  The patient understands all the plans discussed today and is in agreement with them.  I do appreciate Powell Broad, PA* for his new consult. so   Amber Veasey Felicia Horde, MD

## 2023-08-08 ENCOUNTER — Inpatient Hospital Stay: Payer: Medicare HMO | Attending: Oncology | Admitting: Oncology

## 2023-08-08 ENCOUNTER — Telehealth: Payer: Self-pay | Admitting: Oncology

## 2023-08-08 VITALS — BP 157/66 | HR 67 | Temp 97.7°F | Resp 16 | Ht 63.0 in | Wt 226.0 lb

## 2023-08-08 DIAGNOSIS — Z79811 Long term (current) use of aromatase inhibitors: Secondary | ICD-10-CM | POA: Insufficient documentation

## 2023-08-08 DIAGNOSIS — C50912 Malignant neoplasm of unspecified site of left female breast: Secondary | ICD-10-CM | POA: Diagnosis present

## 2023-08-08 DIAGNOSIS — Z170 Estrogen receptor positive status [ER+]: Secondary | ICD-10-CM | POA: Diagnosis not present

## 2023-08-08 DIAGNOSIS — C50412 Malignant neoplasm of upper-outer quadrant of left female breast: Secondary | ICD-10-CM | POA: Diagnosis not present

## 2023-08-08 NOTE — Telephone Encounter (Signed)
 Patient has been scheduled for follow-up visit per 08/07/23 LOS.  LVM notifying pt of appt details, provided my direct number to pt if appt changes need to be made.

## 2023-08-08 NOTE — Progress Notes (Signed)
 Face to face  visit with pt in lobby of [facility]. Pt reports that she is doing well on her aromatase inhibitors with no problematic side effects. Pt seeing Dr. Harles Lied today for a follow up.

## 2023-08-15 ENCOUNTER — Telehealth: Payer: Self-pay | Admitting: Oncology

## 2023-08-15 NOTE — Telephone Encounter (Signed)
 Contacted pt to schedule an appt for Prolia Injection. Unable to reach via phone, voicemail was left.

## 2023-08-17 ENCOUNTER — Other Ambulatory Visit

## 2023-08-18 ENCOUNTER — Inpatient Hospital Stay

## 2023-08-18 VITALS — BP 135/90 | HR 79 | Temp 97.9°F | Resp 18

## 2023-08-18 DIAGNOSIS — C50912 Malignant neoplasm of unspecified site of left female breast: Secondary | ICD-10-CM | POA: Diagnosis not present

## 2023-08-18 DIAGNOSIS — C7951 Secondary malignant neoplasm of bone: Secondary | ICD-10-CM

## 2023-08-18 DIAGNOSIS — M818 Other osteoporosis without current pathological fracture: Secondary | ICD-10-CM

## 2023-08-18 LAB — COMPREHENSIVE METABOLIC PANEL WITH GFR
ALT: 17 U/L (ref 0–44)
AST: 25 U/L (ref 15–41)
Albumin: 4.2 g/dL (ref 3.5–5.0)
Alkaline Phosphatase: 62 U/L (ref 38–126)
Anion gap: 14 (ref 5–15)
BUN: 21 mg/dL (ref 8–23)
CO2: 24 mmol/L (ref 22–32)
Calcium: 10.1 mg/dL (ref 8.9–10.3)
Chloride: 100 mmol/L (ref 98–111)
Creatinine, Ser: 0.99 mg/dL (ref 0.44–1.00)
GFR, Estimated: 59 mL/min — ABNORMAL LOW (ref >60)
Glucose, Bld: 130 mg/dL — ABNORMAL HIGH (ref 70–99)
Potassium: 4.3 mmol/L (ref 3.5–5.1)
Sodium: 138 mmol/L (ref 135–145)
Total Bilirubin: 0.7 mg/dL (ref 0.0–1.2)
Total Protein: 7.2 g/dL (ref 6.5–8.1)

## 2023-08-18 MED ORDER — DENOSUMAB 60 MG/ML ~~LOC~~ SOSY
60.0000 mg | PREFILLED_SYRINGE | Freq: Once | SUBCUTANEOUS | Status: AC
  Administered 2023-08-18: 60 mg via SUBCUTANEOUS
  Filled 2023-08-18: qty 1

## 2023-08-18 NOTE — Patient Instructions (Signed)
 Denosumab Injection (Osteoporosis) What is this medication? DENOSUMAB (den oh SUE mab) prevents and treats osteoporosis. It works by Interior and spatial designer stronger and less likely to break (fracture). It is a monoclonal antibody. This medicine may be used for other purposes; ask your health care provider or pharmacist if you have questions. COMMON BRAND NAME(S): Prolia What should I tell my care team before I take this medication? They need to know if you have any of these conditions: Dental or gum disease Had thyroid or parathyroid (glands located in neck) surgery Having dental surgery or a tooth pulled Kidney disease Low levels of calcium in the blood On dialysis Poor nutrition Thyroid disease Trouble absorbing nutrients from your food An unusual or allergic reaction to denosumab, other medications, foods, dyes, or preservatives Pregnant or trying to get pregnant Breastfeeding How should I use this medication? This medication is injected under the skin. It is given by your care team in a hospital or clinic setting. A special MedGuide will be given to you before each treatment. Be sure to read this information carefully each time. Talk to your care team about the use of this medication in children. Special care may be needed. Overdosage: If you think you have taken too much of this medicine contact a poison control center or emergency room at once. NOTE: This medicine is only for you. Do not share this medicine with others. What if I miss a dose? Keep appointments for follow-up doses. It is important not to miss your dose. Call your care team if you are unable to keep an appointment. What may interact with this medication? Do not take this medication with any of the following: Other medications that contain denosumab This medication may also interact with the following: Medications that lower your chance of fighting infection Steroid medications, such as prednisone or cortisone This  list may not describe all possible interactions. Give your health care provider a list of all the medicines, herbs, non-prescription drugs, or dietary supplements you use. Also tell them if you smoke, drink alcohol, or use illegal drugs. Some items may interact with your medicine. What should I watch for while using this medication? Your condition will be monitored carefully while you are receiving this medication. You may need blood work done while taking this medication. This medication may increase your risk of getting an infection. Call your care team for advice if you get a fever, chills, sore throat, or other symptoms of a cold or flu. Do not treat yourself. Try to avoid being around people who are sick. Tell your dentist and dental surgeon that you are taking this medication. You should not have major dental surgery while on this medication. See your dentist to have a dental exam and fix any dental problems before starting this medication. Take good care of your teeth while on this medication. Make sure you see your dentist for regular follow-up appointments. This medication may cause low levels of calcium in your body. The risk of severe side effects is increased in people with kidney disease. Your care team may prescribe calcium and vitamin D to help prevent low calcium levels while you take this medication. It is important to take calcium and vitamin D as directed by your care team. Talk to your care team if you may be pregnant. Serious birth defects may occur if you take this medication during pregnancy and for 5 months after the last dose. You will need a negative pregnancy test before starting this medication. Contraception  is recommended while taking this medication and for 5 months after the last dose. Your care team can help you find the option that works for you. Talk to your care team before breastfeeding. Changes to your treatment plan may be needed. What side effects may I notice from  receiving this medication? Side effects that you should report to your care team as soon as possible: Allergic reactions--skin rash, itching, hives, swelling of the face, lips, tongue, or throat Infection--fever, chills, cough, sore throat, wounds that don't heal, pain or trouble when passing urine, general feeling of discomfort or being unwell Low calcium level--muscle pain or cramps, confusion, tingling, or numbness in the hands or feet Osteonecrosis of the jaw--pain, swelling, or redness in the mouth, numbness of the jaw, poor healing after dental work, unusual discharge from the mouth, visible bones in the mouth Severe bone, joint, or muscle pain Skin infection--skin redness, swelling, warmth, or pain Side effects that usually do not require medical attention (report these to your care team if they continue or are bothersome): Back pain Headache Joint pain Muscle pain Pain in the hands, arms, legs, or feet Runny or stuffy nose Sore throat This list may not describe all possible side effects. Call your doctor for medical advice about side effects. You may report side effects to FDA at 1-800-FDA-1088. Where should I keep my medication? This medication is given in a hospital or clinic. It will not be stored at home. NOTE: This sheet is a summary. It may not cover all possible information. If you have questions about this medicine, talk to your doctor, pharmacist, or health care provider.  2024 Elsevier/Gold Standard (2022-04-26 00:00:00)

## 2023-10-20 ENCOUNTER — Encounter: Payer: Self-pay | Admitting: Advanced Practice Midwife

## 2023-12-11 ENCOUNTER — Other Ambulatory Visit: Payer: Self-pay

## 2023-12-11 ENCOUNTER — Inpatient Hospital Stay: Attending: Hematology and Oncology | Admitting: Hematology and Oncology

## 2023-12-11 ENCOUNTER — Inpatient Hospital Stay

## 2023-12-11 ENCOUNTER — Telehealth: Payer: Self-pay | Admitting: Hematology and Oncology

## 2023-12-11 VITALS — BP 148/74 | HR 72 | Temp 98.1°F | Resp 14 | Ht 63.0 in | Wt 216.2 lb

## 2023-12-11 DIAGNOSIS — C50912 Malignant neoplasm of unspecified site of left female breast: Secondary | ICD-10-CM | POA: Diagnosis present

## 2023-12-11 DIAGNOSIS — Z79811 Long term (current) use of aromatase inhibitors: Secondary | ICD-10-CM | POA: Diagnosis not present

## 2023-12-11 DIAGNOSIS — Z170 Estrogen receptor positive status [ER+]: Secondary | ICD-10-CM | POA: Diagnosis not present

## 2023-12-11 DIAGNOSIS — C50412 Malignant neoplasm of upper-outer quadrant of left female breast: Secondary | ICD-10-CM | POA: Diagnosis not present

## 2023-12-11 NOTE — Progress Notes (Signed)
 [facility]  [address] [place],  [place]  [address] [phone]  Clinic Day:  08/08/2023  Referring physician: Dia Lamarr BRAVO, PA*   HISTORY OF PRESENT ILLNESS:  Amber Ashley is a 78 y.o. female with stage IA (T1a N0 M0) hormone positive breast cancer, status post a left breast lumpectomy in December 2024.  She is currently taking anastrozole  for her adjuvant endocrine therapy.  She comes in today for routine follow-up.  Since her last visit, the patient has been doing fine.  She denies having any particular changes in her breasts which concern her for early disease recurrence.    PHYSICAL EXAM:  Blood pressure (!) 148/74, pulse 72, temperature 98.1 F (36.7 C), temperature source Oral, resp. rate 14, height 5' 3 (1.6 m), weight 216 lb 3.2 oz (98.1 kg), SpO2 100%. Wt Readings from Last 3 Encounters:  12/11/23 216 lb 3.2 oz (98.1 kg)  08/08/23 226 lb (102.5 kg)  07/27/23 225 lb (102.1 kg)   Body mass index is 38.3 kg/m. Performance status (ECOG): 1 - Symptomatic but completely ambulatory Physical Exam Constitutional:      Appearance: Normal appearance.  HENT:     Mouth/Throat:     Pharynx: Oropharynx is clear. No oropharyngeal exudate.  Cardiovascular:     Rate and Rhythm: Normal rate and regular rhythm.     Heart sounds: No murmur heard.    No friction rub. No gallop.  Pulmonary:     Breath sounds: Normal breath sounds.  Chest:  Breasts:    Right: No swelling, bleeding, inverted nipple, mass, nipple discharge or skin change.     Left: No swelling, bleeding, inverted nipple, mass, nipple discharge or skin change.  Abdominal:     General: Bowel sounds are normal. There is no distension.     Palpations: Abdomen is soft. There is no mass.     Tenderness: There is no abdominal tenderness.  Musculoskeletal:        General: No tenderness.     Cervical back: Normal range of motion and neck supple.     Right lower leg: No edema.      Left lower leg: No edema.  Lymphadenopathy:     Cervical: No cervical adenopathy.     Right cervical: No superficial, deep or posterior cervical adenopathy.    Left cervical: No superficial, deep or posterior cervical adenopathy.     Upper Body:     Right upper body: No supraclavicular or axillary adenopathy.     Left upper body: No supraclavicular or axillary adenopathy.     Lower Body: No right inguinal adenopathy. No left inguinal adenopathy.  Skin:    Coloration: Skin is not jaundiced.     Findings: No lesion or rash.  Neurological:     General: No focal deficit present.     Mental Status: She is alert and oriented to person, place, and time. Mental status is at baseline.  Psychiatric:        Mood and Affect: Mood normal.        Behavior: Behavior normal.        Thought Content: Thought content normal.        Judgment: Judgment normal.    ASSESSMENT & PLAN:   Amber Ashley is a 78 y.o. female with stage IA (T1a N0 M0) hormone positive breast cancer, status post a left breast lumpectomy in December 2024.  Based upon her clinical breast exam today, the patient remains disease  free.  She knows to continue taking her anastrozole  on a daily basis to complete 5 total years of adjuvant endocrine therapy.  Clinically, she is doing well. I will continue to follow her every 4 months with clinical breast exams.  The patient understands all the plans discussed today and is in agreement with them.  Amber DELENA Bach, NP

## 2023-12-11 NOTE — Telephone Encounter (Signed)
 Patient has been scheduled for follow-up visit per 12/11/23 LOS.  Pt given an appt calendar with date and time.

## 2024-02-06 ENCOUNTER — Other Ambulatory Visit: Payer: Self-pay

## 2024-02-06 ENCOUNTER — Inpatient Hospital Stay: Attending: Hematology and Oncology

## 2024-02-06 ENCOUNTER — Inpatient Hospital Stay

## 2024-02-06 VITALS — BP 152/66 | HR 65 | Temp 97.7°F | Ht 63.0 in

## 2024-02-06 DIAGNOSIS — Z853 Personal history of malignant neoplasm of breast: Secondary | ICD-10-CM | POA: Diagnosis not present

## 2024-02-06 DIAGNOSIS — M810 Age-related osteoporosis without current pathological fracture: Secondary | ICD-10-CM

## 2024-02-06 DIAGNOSIS — M818 Other osteoporosis without current pathological fracture: Secondary | ICD-10-CM

## 2024-02-06 LAB — COMPREHENSIVE METABOLIC PANEL WITH GFR
ALT: 21 U/L (ref 0–44)
AST: 26 U/L (ref 15–41)
Albumin: 4.2 g/dL (ref 3.5–5.0)
Alkaline Phosphatase: 57 U/L (ref 38–126)
Anion gap: 9 (ref 5–15)
BUN: 17 mg/dL (ref 8–23)
CO2: 28 mmol/L (ref 22–32)
Calcium: 9.4 mg/dL (ref 8.9–10.3)
Chloride: 102 mmol/L (ref 98–111)
Creatinine, Ser: 0.82 mg/dL (ref 0.44–1.00)
GFR, Estimated: >60 mL/min (ref >60)
Glucose, Bld: 130 mg/dL — ABNORMAL HIGH (ref 70–99)
Potassium: 4.6 mmol/L (ref 3.5–5.1)
Sodium: 139 mmol/L (ref 135–145)
Total Bilirubin: 1.1 mg/dL (ref 0.0–1.2)
Total Protein: 7.1 g/dL (ref 6.5–8.1)

## 2024-02-06 MED ORDER — DENOSUMAB 60 MG/ML ~~LOC~~ SOSY
60.0000 mg | PREFILLED_SYRINGE | Freq: Once | SUBCUTANEOUS | Status: AC
  Administered 2024-02-06: 60 mg via SUBCUTANEOUS
  Filled 2024-02-06: qty 1

## 2024-02-06 NOTE — Patient Instructions (Signed)
 Denosumab Injection (Osteoporosis) What is this medication? DENOSUMAB (den oh SUE mab) prevents and treats osteoporosis. It works by Interior and spatial designer stronger and less likely to break (fracture). It is a monoclonal antibody. This medicine may be used for other purposes; ask your health care provider or pharmacist if you have questions. COMMON BRAND NAME(S): Prolia What should I tell my care team before I take this medication? They need to know if you have any of these conditions: Dental or gum disease Had thyroid or parathyroid (glands located in neck) surgery Having dental surgery or a tooth pulled Kidney disease Low levels of calcium in the blood On dialysis Poor nutrition Thyroid disease Trouble absorbing nutrients from your food An unusual or allergic reaction to denosumab, other medications, foods, dyes, or preservatives Pregnant or trying to get pregnant Breastfeeding How should I use this medication? This medication is injected under the skin. It is given by your care team in a hospital or clinic setting. A special MedGuide will be given to you before each treatment. Be sure to read this information carefully each time. Talk to your care team about the use of this medication in children. Special care may be needed. Overdosage: If you think you have taken too much of this medicine contact a poison control center or emergency room at once. NOTE: This medicine is only for you. Do not share this medicine with others. What if I miss a dose? Keep appointments for follow-up doses. It is important not to miss your dose. Call your care team if you are unable to keep an appointment. What may interact with this medication? Do not take this medication with any of the following: Other medications that contain denosumab This medication may also interact with the following: Medications that lower your chance of fighting infection Steroid medications, such as prednisone or cortisone This  list may not describe all possible interactions. Give your health care provider a list of all the medicines, herbs, non-prescription drugs, or dietary supplements you use. Also tell them if you smoke, drink alcohol, or use illegal drugs. Some items may interact with your medicine. What should I watch for while using this medication? Your condition will be monitored carefully while you are receiving this medication. You may need blood work done while taking this medication. This medication may increase your risk of getting an infection. Call your care team for advice if you get a fever, chills, sore throat, or other symptoms of a cold or flu. Do not treat yourself. Try to avoid being around people who are sick. Tell your dentist and dental surgeon that you are taking this medication. You should not have major dental surgery while on this medication. See your dentist to have a dental exam and fix any dental problems before starting this medication. Take good care of your teeth while on this medication. Make sure you see your dentist for regular follow-up appointments. This medication may cause low levels of calcium in your body. The risk of severe side effects is increased in people with kidney disease. Your care team may prescribe calcium and vitamin D to help prevent low calcium levels while you take this medication. It is important to take calcium and vitamin D as directed by your care team. Talk to your care team if you may be pregnant. Serious birth defects may occur if you take this medication during pregnancy and for 5 months after the last dose. You will need a negative pregnancy test before starting this medication. Contraception  is recommended while taking this medication and for 5 months after the last dose. Your care team can help you find the option that works for you. Talk to your care team before breastfeeding. Changes to your treatment plan may be needed. What side effects may I notice from  receiving this medication? Side effects that you should report to your care team as soon as possible: Allergic reactions--skin rash, itching, hives, swelling of the face, lips, tongue, or throat Infection--fever, chills, cough, sore throat, wounds that don't heal, pain or trouble when passing urine, general feeling of discomfort or being unwell Low calcium level--muscle pain or cramps, confusion, tingling, or numbness in the hands or feet Osteonecrosis of the jaw--pain, swelling, or redness in the mouth, numbness of the jaw, poor healing after dental work, unusual discharge from the mouth, visible bones in the mouth Severe bone, joint, or muscle pain Skin infection--skin redness, swelling, warmth, or pain Side effects that usually do not require medical attention (report these to your care team if they continue or are bothersome): Back pain Headache Joint pain Muscle pain Pain in the hands, arms, legs, or feet Runny or stuffy nose Sore throat This list may not describe all possible side effects. Call your doctor for medical advice about side effects. You may report side effects to FDA at 1-800-FDA-1088. Where should I keep my medication? This medication is given in a hospital or clinic. It will not be stored at home. NOTE: This sheet is a summary. It may not cover all possible information. If you have questions about this medicine, talk to your doctor, pharmacist, or health care provider.  2024 Elsevier/Gold Standard (2022-04-26 00:00:00)

## 2024-03-08 ENCOUNTER — Other Ambulatory Visit: Payer: Self-pay | Admitting: Physician Assistant

## 2024-03-08 DIAGNOSIS — Z96652 Presence of left artificial knee joint: Secondary | ICD-10-CM

## 2024-03-18 ENCOUNTER — Other Ambulatory Visit (HOSPITAL_COMMUNITY)

## 2024-03-18 ENCOUNTER — Encounter (HOSPITAL_COMMUNITY): Payer: Self-pay

## 2024-04-10 NOTE — Progress Notes (Unsigned)
 " [facility] [address] [place],  [place]  [address] [phone]  Clinic Day:  08/08/2023  Referring physician: Dia Lamarr BRAVO, PA-C   HISTORY OF PRESENT ILLNESS:  Amber Ashley is a 79 y.o. female with stage IA (T1a N0 M0) hormone positive breast cancer, status post a left breast lumpectomy in December 2024.  She is currently taking anastrozole  for her adjuvant endocrine therapy.  She comes in today for routine follow-up.  Since her last visit, the patient has been doing fine.  She denies having any particular changes in her breasts which concern her for early disease recurrence.  Of note, her annual mammogram in September 2025 showed no evidence of disease recurrence.    PHYSICAL EXAM:  There were no vitals taken for this visit. Wt Readings from Last 3 Encounters:  12/11/23 216 lb 3.2 oz (98.1 kg)  08/08/23 226 lb (102.5 kg)  07/27/23 225 lb (102.1 kg)   There is no height or weight on file to calculate BMI. Performance status (ECOG): 1 - Symptomatic but completely ambulatory Physical Exam Constitutional:      Appearance: Normal appearance.  HENT:     Mouth/Throat:     Pharynx: Oropharynx is clear. No oropharyngeal exudate.  Cardiovascular:     Rate and Rhythm: Normal rate and regular rhythm.     Heart sounds: No murmur heard.    No friction rub. No gallop.  Pulmonary:     Breath sounds: Normal breath sounds.  Chest:  Breasts:    Right: No swelling, bleeding, inverted nipple, mass, nipple discharge or skin change.     Left: No swelling, bleeding, inverted nipple, mass, nipple discharge or skin change.  Abdominal:     General: Bowel sounds are normal. There is no distension.     Palpations: Abdomen is soft. There is no mass.     Tenderness: There is no abdominal tenderness.  Musculoskeletal:        General: No tenderness.     Cervical back: Normal range of motion and neck supple.     Right lower leg: No edema.     Left lower  leg: No edema.  Lymphadenopathy:     Cervical: No cervical adenopathy.     Right cervical: No superficial, deep or posterior cervical adenopathy.    Left cervical: No superficial, deep or posterior cervical adenopathy.     Upper Body:     Right upper body: No supraclavicular or axillary adenopathy.     Left upper body: No supraclavicular or axillary adenopathy.     Lower Body: No right inguinal adenopathy. No left inguinal adenopathy.  Skin:    Coloration: Skin is not jaundiced.     Findings: No lesion or rash.  Neurological:     General: No focal deficit present.     Mental Status: She is alert and oriented to person, place, and time. Mental status is at baseline.  Psychiatric:        Mood and Affect: Mood normal.        Behavior: Behavior normal.        Thought Content: Thought content normal.        Judgment: Judgment normal.    ASSESSMENT & PLAN:   Amber Ashley is a 79 y.o. female with stage IA (T1a N0 M0) hormone positive breast cancer, status post a left breast lumpectomy in December 2024.  Based upon her clinical breast exam today, the patient remains disease free.  She knows to continue taking her anastrozole  on a daily basis to complete 5 total years of adjuvant endocrine therapy.  Clinically, she is doing well. I will continue to follow her every 4 months with clinical breast exams.  The patient understands all the plans discussed today and is in agreement with them.  Leilany Digeronimo DELENA Kerns, MD       "

## 2024-04-11 ENCOUNTER — Inpatient Hospital Stay: Attending: Hematology and Oncology | Admitting: Oncology

## 2024-04-11 ENCOUNTER — Telehealth: Payer: Self-pay | Admitting: Oncology

## 2024-04-11 NOTE — Telephone Encounter (Signed)
 Contacted pt to reschedule missed appt from 04/11/24. Unable to reach via phone, voicemail was left.

## 2024-04-15 NOTE — Telephone Encounter (Signed)
 Contacted pt to schedule an appt. Unable to reach via phone, voicemail was left.

## 2024-04-19 ENCOUNTER — Other Ambulatory Visit: Payer: Self-pay | Admitting: Surgery

## 2024-04-19 DIAGNOSIS — C50412 Malignant neoplasm of upper-outer quadrant of left female breast: Secondary | ICD-10-CM

## 2024-04-19 DIAGNOSIS — N644 Mastodynia: Secondary | ICD-10-CM

## 2024-04-23 NOTE — Progress Notes (Unsigned)
 " [facility] [address] [place],  [place]  [address] [phone]  Clinic Day:  08/08/2023  Referring physician: Dia Lamarr BRAVO, PA-C   HISTORY OF PRESENT ILLNESS:  Amber Ashley is a 79 y.o. female with stage IA (T1a N0 M0) hormone positive breast cancer, status post a left breast lumpectomy in December 2024.  She is currently taking anastrozole  for her adjuvant endocrine therapy.  She comes in today for routine follow-up.  Since her last visit, the patient has been doing fine.  She denies having any particular changes in her breasts which concern her for early disease recurrence.  Of note, her annual mammogram in September 2025 showed no evidence of disease recurrence.    PHYSICAL EXAM:  There were no vitals taken for this visit. Wt Readings from Last 3 Encounters:  12/11/23 216 lb 3.2 oz (98.1 kg)  08/08/23 226 lb (102.5 kg)  07/27/23 225 lb (102.1 kg)   There is no height or weight on file to calculate BMI. Performance status (ECOG): 1 - Symptomatic but completely ambulatory Physical Exam Constitutional:      Appearance: Normal appearance.  HENT:     Mouth/Throat:     Pharynx: Oropharynx is clear. No oropharyngeal exudate.  Cardiovascular:     Rate and Rhythm: Normal rate and regular rhythm.     Heart sounds: No murmur heard.    No friction rub. No gallop.  Pulmonary:     Breath sounds: Normal breath sounds.  Chest:  Breasts:    Right: No swelling, bleeding, inverted nipple, mass, nipple discharge or skin change.     Left: No swelling, bleeding, inverted nipple, mass, nipple discharge or skin change.  Abdominal:     General: Bowel sounds are normal. There is no distension.     Palpations: Abdomen is soft. There is no mass.     Tenderness: There is no abdominal tenderness.  Musculoskeletal:        General: No tenderness.     Cervical back: Normal range of motion and neck supple.     Right lower leg: No edema.     Left lower  leg: No edema.  Lymphadenopathy:     Cervical: No cervical adenopathy.     Right cervical: No superficial, deep or posterior cervical adenopathy.    Left cervical: No superficial, deep or posterior cervical adenopathy.     Upper Body:     Right upper body: No supraclavicular or axillary adenopathy.     Left upper body: No supraclavicular or axillary adenopathy.     Lower Body: No right inguinal adenopathy. No left inguinal adenopathy.  Skin:    Coloration: Skin is not jaundiced.     Findings: No lesion or rash.  Neurological:     General: No focal deficit present.     Mental Status: She is alert and oriented to person, place, and time. Mental status is at baseline.  Psychiatric:        Mood and Affect: Mood normal.        Behavior: Behavior normal.        Thought Content: Thought content normal.        Judgment: Judgment normal.    ASSESSMENT & PLAN:   Amber Ashley is a 79 y.o. female with stage IA (T1a N0 M0) hormone positive breast cancer, status post a left breast lumpectomy in December 2024.  Based upon her clinical breast exam today, the patient remains disease free.  She knows to continue taking her anastrozole  on a daily basis to complete 5 total years of adjuvant endocrine therapy.  Clinically, she is doing well. I will continue to follow her every 4 months with clinical breast exams.  The patient understands all the plans discussed today and is in agreement with them.  Amber Digeronimo DELENA Kerns, MD       "

## 2024-04-24 ENCOUNTER — Inpatient Hospital Stay: Admitting: Oncology

## 2024-04-24 ENCOUNTER — Other Ambulatory Visit: Payer: Self-pay | Admitting: Oncology

## 2024-04-24 VITALS — BP 155/77 | HR 89 | Temp 97.8°F | Resp 16 | Ht 63.0 in | Wt 211.4 lb

## 2024-04-24 DIAGNOSIS — Z170 Estrogen receptor positive status [ER+]: Secondary | ICD-10-CM | POA: Diagnosis not present

## 2024-04-24 DIAGNOSIS — C50412 Malignant neoplasm of upper-outer quadrant of left female breast: Secondary | ICD-10-CM

## 2024-04-29 ENCOUNTER — Other Ambulatory Visit: Payer: Self-pay | Admitting: Oncology

## 2024-05-01 ENCOUNTER — Other Ambulatory Visit

## 2024-05-01 ENCOUNTER — Encounter

## 2024-05-09 ENCOUNTER — Encounter

## 2024-05-09 ENCOUNTER — Other Ambulatory Visit

## 2024-05-10 ENCOUNTER — Ambulatory Visit (HOSPITAL_COMMUNITY): Admission: RE | Admit: 2024-05-10 | Source: Ambulatory Visit

## 2024-05-20 ENCOUNTER — Ambulatory Visit (HOSPITAL_COMMUNITY)

## 2024-05-21 ENCOUNTER — Other Ambulatory Visit

## 2024-05-21 ENCOUNTER — Encounter

## 2024-05-27 ENCOUNTER — Inpatient Hospital Stay: Admitting: Oncology
